# Patient Record
Sex: Male | Born: 1955 | Race: Asian | Hispanic: No | Marital: Married | Smoking: Never smoker
Health system: Southern US, Community
[De-identification: ages and names within clinical notes are randomized; demographics above are authoritative.]

## PROBLEM LIST (undated history)

## (undated) DIAGNOSIS — I10 Essential (primary) hypertension: Secondary | ICD-10-CM

## (undated) DIAGNOSIS — R1013 Epigastric pain: Secondary | ICD-10-CM

## (undated) DIAGNOSIS — K7689 Other specified diseases of liver: Secondary | ICD-10-CM

## (undated) DIAGNOSIS — N529 Male erectile dysfunction, unspecified: Secondary | ICD-10-CM

## (undated) DIAGNOSIS — R9431 Abnormal electrocardiogram [ECG] [EKG]: Secondary | ICD-10-CM

## (undated) DIAGNOSIS — E7800 Pure hypercholesterolemia, unspecified: Secondary | ICD-10-CM

## (undated) DIAGNOSIS — Z86010 Personal history of colonic polyps: Secondary | ICD-10-CM

## (undated) DIAGNOSIS — E119 Type 2 diabetes mellitus without complications: Secondary | ICD-10-CM

## (undated) DIAGNOSIS — F329 Major depressive disorder, single episode, unspecified: Secondary | ICD-10-CM

## (undated) DIAGNOSIS — K7581 Nonalcoholic steatohepatitis (NASH): Secondary | ICD-10-CM

## (undated) DIAGNOSIS — B009 Herpesviral infection, unspecified: Secondary | ICD-10-CM

## (undated) DIAGNOSIS — K219 Gastro-esophageal reflux disease without esophagitis: Secondary | ICD-10-CM

## (undated) HISTORY — DX: Essential (primary) hypertension: I10

## (undated) HISTORY — DX: Pure hypercholesterolemia, unspecified: E78.00

## (undated) HISTORY — DX: Abnormal electrocardiogram (ECG) (EKG): R94.31

## (undated) HISTORY — DX: Other specified diseases of liver: K76.89

## (undated) HISTORY — DX: Gastro-esophageal reflux disease without esophagitis: K21.9

## (undated) HISTORY — DX: Type 2 diabetes mellitus without complications: E11.9

## (undated) HISTORY — PX: OTHER SURGICAL HISTORY: SHX169

## (undated) HISTORY — PX: COLONOSCOPY: SHX174

## (undated) HISTORY — DX: Male erectile dysfunction, unspecified: N52.9

## (undated) HISTORY — DX: Major depressive disorder, single episode, unspecified: F32.9

## (undated) HISTORY — DX: Personal history of colonic polyps: Z86.010

## (undated) HISTORY — DX: Herpesviral infection, unspecified: B00.9

## (undated) HISTORY — DX: Epigastric pain: R10.13

## (undated) HISTORY — DX: Nonalcoholic steatohepatitis (NASH): K75.81

---

## 2003-11-22 HISTORY — PX: OTHER SURGICAL HISTORY: SHX169

## 2004-08-15 ENCOUNTER — Ambulatory Visit: Payer: Self-pay | Admitting: Endocrinology

## 2004-11-21 ENCOUNTER — Ambulatory Visit: Payer: Self-pay | Admitting: Endocrinology

## 2004-11-22 ENCOUNTER — Ambulatory Visit (HOSPITAL_COMMUNITY): Admission: RE | Admit: 2004-11-22 | Discharge: 2004-11-22 | Payer: Self-pay | Admitting: Endocrinology

## 2005-05-15 ENCOUNTER — Ambulatory Visit: Payer: Self-pay | Admitting: Endocrinology

## 2005-05-22 ENCOUNTER — Ambulatory Visit: Payer: Self-pay | Admitting: Endocrinology

## 2005-05-29 ENCOUNTER — Ambulatory Visit: Payer: Self-pay | Admitting: Pulmonary Disease

## 2005-07-30 ENCOUNTER — Ambulatory Visit: Payer: Self-pay | Admitting: Endocrinology

## 2005-08-06 ENCOUNTER — Ambulatory Visit: Payer: Self-pay | Admitting: Endocrinology

## 2005-11-29 ENCOUNTER — Ambulatory Visit: Payer: Self-pay | Admitting: Endocrinology

## 2005-12-05 ENCOUNTER — Ambulatory Visit: Payer: Self-pay | Admitting: Endocrinology

## 2006-03-27 ENCOUNTER — Ambulatory Visit: Payer: Self-pay | Admitting: Endocrinology

## 2006-04-01 ENCOUNTER — Ambulatory Visit: Payer: Self-pay | Admitting: Endocrinology

## 2006-04-28 IMAGING — CT CT ABDOMEN W/ CM
1 of 3 series · 14 of 32 positions shown, 19 images · IV contrast (omnipaque)
Comparison: None.

CLINICAL DATA: 48-year-old male ? elevated LFT(s). 
ABDOMEN CT SCAN WITH CONTRAST:
TECHNIQUE: 125 cc Omnipaque contrast administered intravenously with a multidetector helical imaging performed of the abdomen.

[Series 2: abdomen 5.0 b40f st · axial · 0.63mm/px · z∈[+1076,+1330]mm · 14 of 59 slices shown, 19 images]
[im 4/59  soft-tissue]
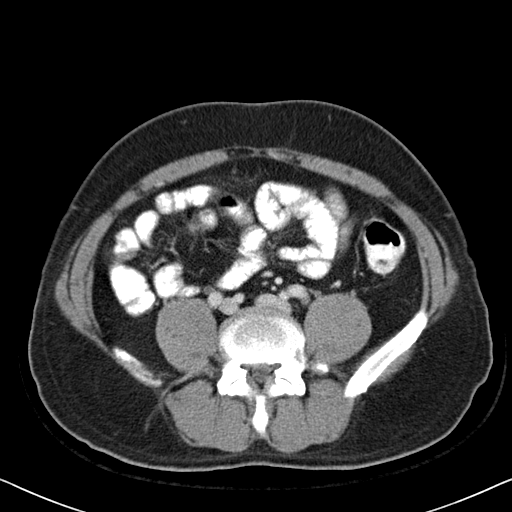
[im 4/59  bone]
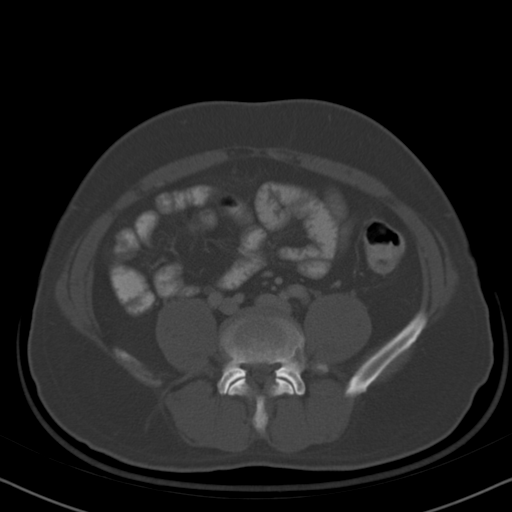
[im 8/59  soft-tissue]
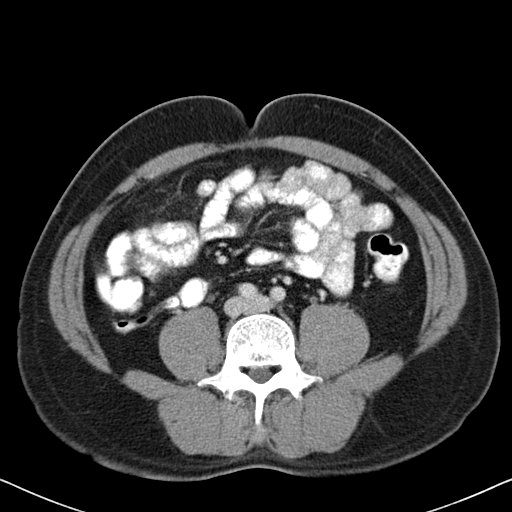
[im 11/59  soft-tissue]
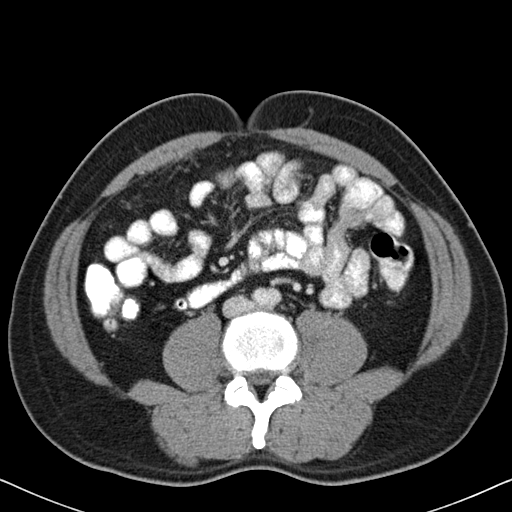
[im 19/59  soft-tissue]
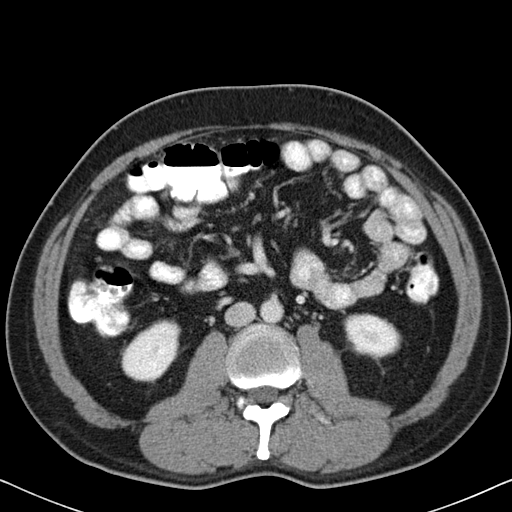
[im 22/59  soft-tissue]
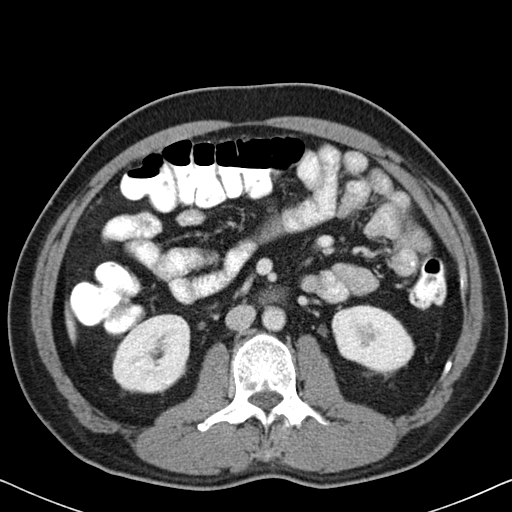
[im 26/59  soft-tissue]
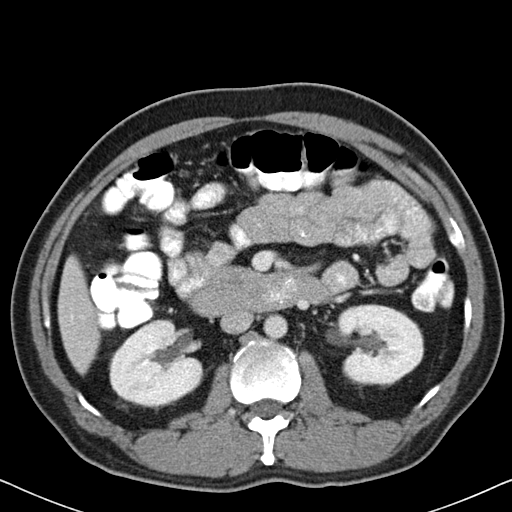
[im 30/59  soft-tissue]
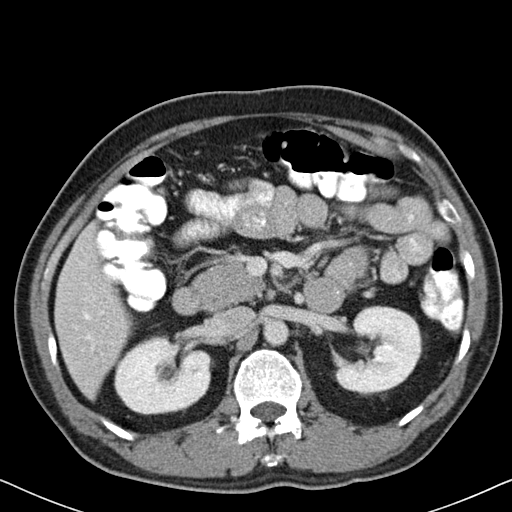
[im 33/59  soft-tissue]
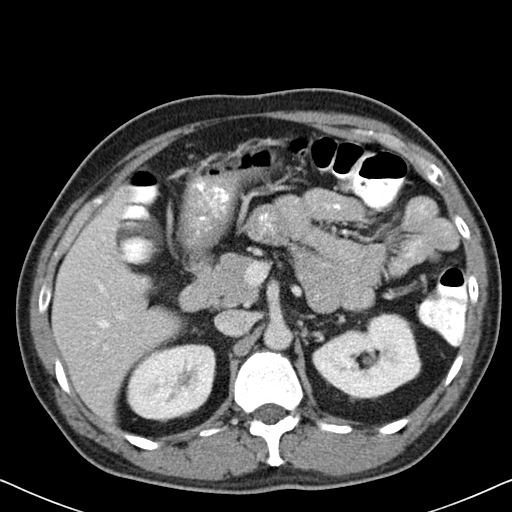
[im 37/59  soft-tissue]
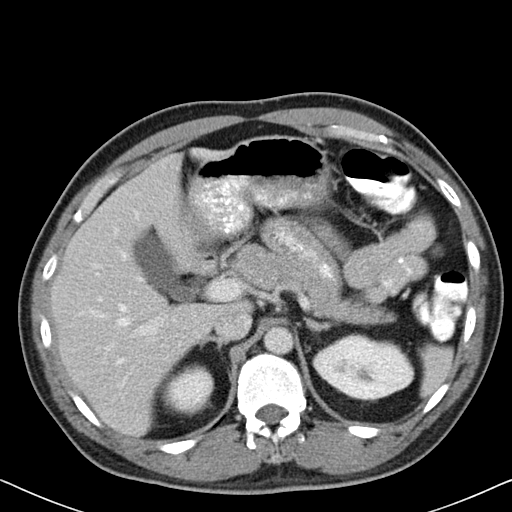
[im 37/59  bone]
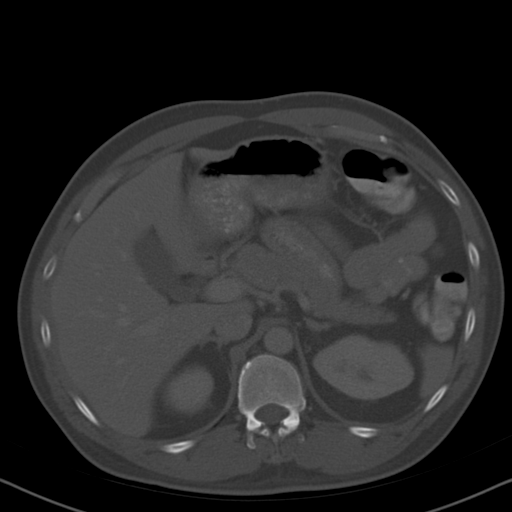
[im 40/59  soft-tissue]
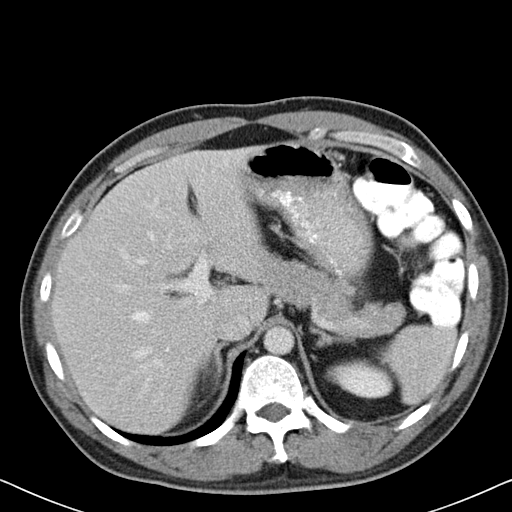
[im 44/59  lung]
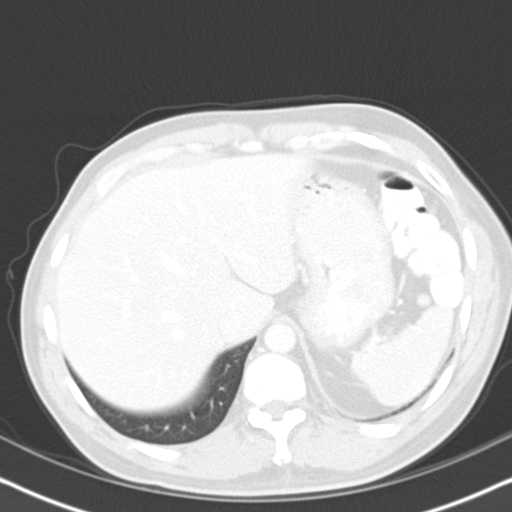
[im 48/59  soft-tissue]
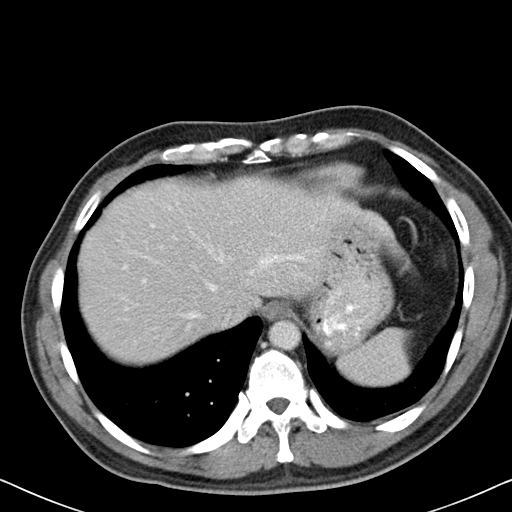
[im 48/59  lung]
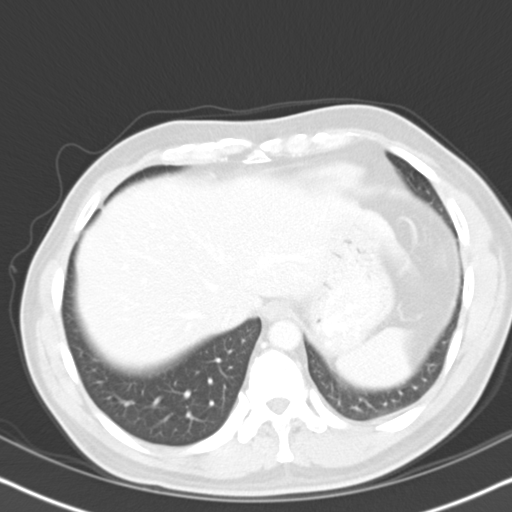
[im 51/59  soft-tissue]
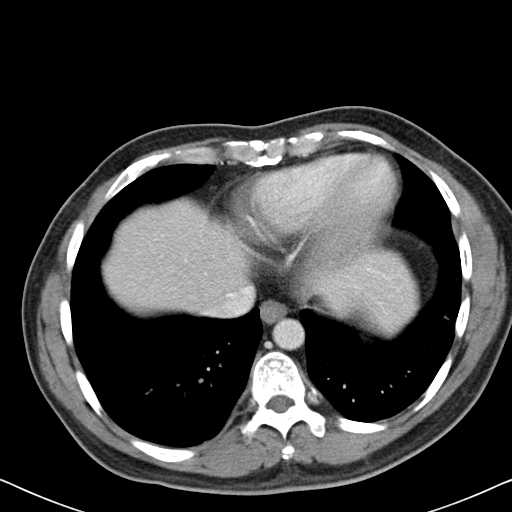
[im 51/59  lung]
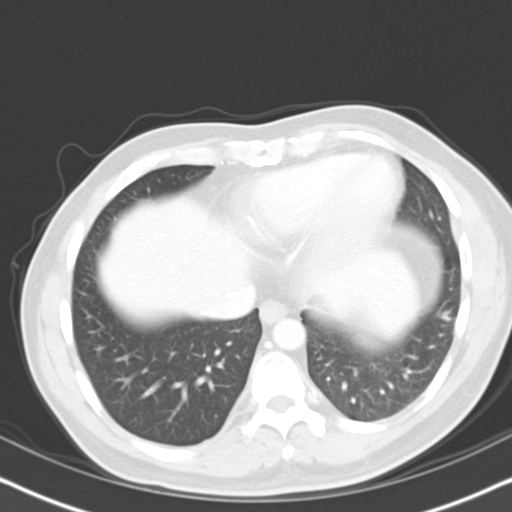
[im 55/59  soft-tissue]
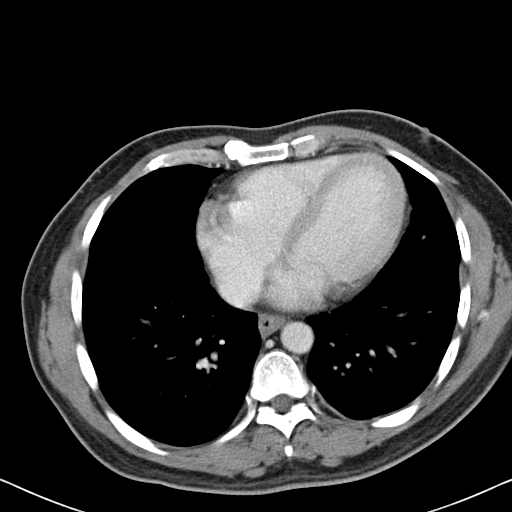
[im 55/59  lung]
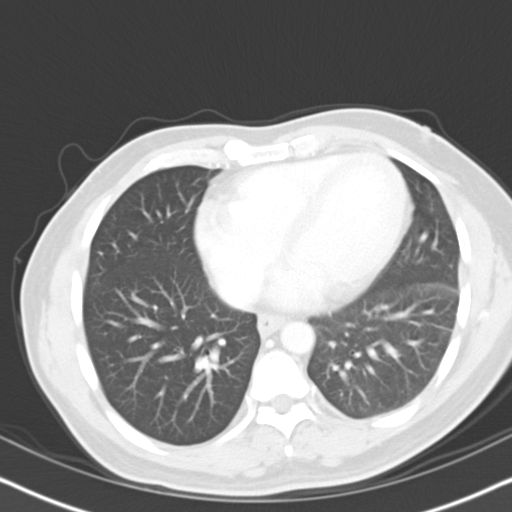

[14 of 32 positions shown; findings below may reference images not displayed]

FINDINGS: Minimal bibasilar subpleural scarring, images 7 and 8.  No pericardial or pleural fluid.  Liver demonstrates homogeneous enhancement without a focal abnormality, biliary dilatation, or fatty infiltration.  Hepatic and portal veins are patent.  The gallbladder, biliary system, adrenal glands, spleen, accessory splenule, kidneys and pancreas are all normal.  The proximal small bowel is under distended.  No bowel obstruction, dilatation, ascites, adenopathy or free air.  Appendix in the right lower quadrant contains contrast and air and is normal.
IMPRESSION: No acute finding in the abdomen.  Normal CT appearance of the liver.

## 2006-07-04 ENCOUNTER — Ambulatory Visit: Payer: Self-pay | Admitting: Endocrinology

## 2006-07-04 LAB — CONVERTED CEMR LAB
Microalb Creat Ratio: 13.7 mg/g (ref 0.0–30.0)
Microalb, Ur: 2.9 mg/dL — ABNORMAL HIGH (ref 0.0–1.9)

## 2006-07-08 ENCOUNTER — Ambulatory Visit: Payer: Self-pay | Admitting: Endocrinology

## 2006-07-14 ENCOUNTER — Ambulatory Visit: Payer: Self-pay | Admitting: Internal Medicine

## 2006-10-27 ENCOUNTER — Ambulatory Visit: Payer: Self-pay | Admitting: Endocrinology

## 2006-11-04 ENCOUNTER — Ambulatory Visit: Payer: Self-pay | Admitting: Endocrinology

## 2007-02-03 ENCOUNTER — Ambulatory Visit: Payer: Self-pay | Admitting: Endocrinology

## 2007-02-03 LAB — CONVERTED CEMR LAB
Hgb A1c MFr Bld: 6.2 % — ABNORMAL HIGH (ref 4.6–6.0)
Testosterone: 568.6 ng/dL (ref 350.00–890)

## 2007-02-09 ENCOUNTER — Ambulatory Visit: Payer: Self-pay | Admitting: Endocrinology

## 2007-03-31 ENCOUNTER — Encounter: Payer: Self-pay | Admitting: Endocrinology

## 2007-03-31 DIAGNOSIS — F329 Major depressive disorder, single episode, unspecified: Secondary | ICD-10-CM

## 2007-03-31 DIAGNOSIS — F3289 Other specified depressive episodes: Secondary | ICD-10-CM

## 2007-03-31 DIAGNOSIS — E119 Type 2 diabetes mellitus without complications: Secondary | ICD-10-CM | POA: Insufficient documentation

## 2007-03-31 DIAGNOSIS — I10 Essential (primary) hypertension: Secondary | ICD-10-CM | POA: Insufficient documentation

## 2007-03-31 HISTORY — DX: Major depressive disorder, single episode, unspecified: F32.9

## 2007-03-31 HISTORY — DX: Type 2 diabetes mellitus without complications: E11.9

## 2007-03-31 HISTORY — DX: Other specified depressive episodes: F32.89

## 2007-03-31 HISTORY — DX: Essential (primary) hypertension: I10

## 2007-06-19 ENCOUNTER — Telehealth (INDEPENDENT_AMBULATORY_CARE_PROVIDER_SITE_OTHER): Payer: Self-pay | Admitting: *Deleted

## 2007-07-06 ENCOUNTER — Ambulatory Visit: Payer: Self-pay | Admitting: Endocrinology

## 2007-07-06 LAB — CONVERTED CEMR LAB
AST: 23 units/L (ref 0–37)
Albumin: 4.2 g/dL (ref 3.5–5.2)
Alkaline Phosphatase: 50 units/L (ref 39–117)
Basophils Relative: 0.9 % (ref 0.0–1.0)
Bilirubin, Direct: 0.1 mg/dL (ref 0.0–0.3)
CO2: 30 meq/L (ref 19–32)
Calcium: 9.4 mg/dL (ref 8.4–10.5)
Cholesterol: 97 mg/dL (ref 0–200)
Creatinine, Ser: 1 mg/dL (ref 0.4–1.5)
Creatinine,U: 94.7 mg/dL
Eosinophils Relative: 1.2 % (ref 0.0–5.0)
GFR calc Af Amer: 101 mL/min
HDL: 35.1 mg/dL — ABNORMAL LOW (ref >39.0)
Hemoglobin: 15.3 g/dL (ref 13.0–17.0)
LDL Cholesterol: 42 mg/dL (ref 0–99)
Leukocytes, UA: NEGATIVE
MCHC: 33.6 g/dL (ref 30.0–36.0)
MCV: 93.1 fL (ref 78.0–100.0)
Microalb, Ur: 1.3 mg/dL (ref 0.0–1.9)
Monocytes Absolute: 0.4 10*3/uL (ref 0.2–0.7)
Nitrite: NEGATIVE
Platelets: 241 10*3/uL (ref 150–400)
Potassium: 4.4 meq/L (ref 3.5–5.1)
RBC: 4.88 M/uL (ref 4.22–5.81)
RDW: 11.9 % (ref 11.5–14.6)
Total Bilirubin: 0.8 mg/dL (ref 0.3–1.2)
Total CHOL/HDL Ratio: 2.8
Total Protein: 7.4 g/dL (ref 6.0–8.3)
Triglycerides: 98 mg/dL (ref 0–149)
VLDL: 20 mg/dL (ref 0–40)

## 2007-07-13 ENCOUNTER — Ambulatory Visit: Payer: Self-pay | Admitting: Endocrinology

## 2007-10-09 ENCOUNTER — Encounter: Payer: Self-pay | Admitting: Endocrinology

## 2008-01-01 ENCOUNTER — Encounter: Payer: Self-pay | Admitting: Endocrinology

## 2008-01-05 ENCOUNTER — Ambulatory Visit: Payer: Self-pay | Admitting: Endocrinology

## 2008-01-05 ENCOUNTER — Telehealth: Payer: Self-pay | Admitting: Endocrinology

## 2008-01-11 ENCOUNTER — Ambulatory Visit: Payer: Self-pay | Admitting: Endocrinology

## 2008-01-11 DIAGNOSIS — M25529 Pain in unspecified elbow: Secondary | ICD-10-CM

## 2008-01-11 DIAGNOSIS — R252 Cramp and spasm: Secondary | ICD-10-CM | POA: Insufficient documentation

## 2008-01-26 ENCOUNTER — Telehealth: Payer: Self-pay | Admitting: Endocrinology

## 2008-07-12 ENCOUNTER — Ambulatory Visit: Payer: Self-pay | Admitting: Endocrinology

## 2008-07-13 LAB — CONVERTED CEMR LAB
AST: 29 units/L (ref 0–37)
Albumin: 4.2 g/dL (ref 3.5–5.2)
Alkaline Phosphatase: 43 units/L (ref 39–117)
Basophils Relative: 0.5 % (ref 0.0–3.0)
Calcium: 9.2 mg/dL (ref 8.4–10.5)
Eosinophils Absolute: 0.1 10*3/uL (ref 0.0–0.7)
Eosinophils Relative: 1.3 % (ref 0.0–5.0)
GFR calc Af Amer: 114 mL/min
HCT: 43.1 % (ref 39.0–52.0)
HDL: 35.7 mg/dL — ABNORMAL LOW (ref >39.0)
Hemoglobin: 14.5 g/dL (ref 13.0–17.0)
Lymphocytes Relative: 37.8 % (ref 12.0–46.0)
MCV: 95 fL (ref 78.0–100.0)
Microalb Creat Ratio: 10.4 mg/g (ref 0.0–30.0)
Microalb, Ur: 1.1 mg/dL (ref 0.0–1.9)
Monocytes Absolute: 0.3 10*3/uL (ref 0.1–1.0)
Monocytes Relative: 5.3 % (ref 3.0–12.0)
Neutrophils Relative %: 55.1 % (ref 43.0–77.0)
Platelets: 191 10*3/uL (ref 150–400)
Potassium: 4.1 meq/L (ref 3.5–5.1)
RBC: 4.54 M/uL (ref 4.22–5.81)
RDW: 12.1 % (ref 11.5–14.6)
Total CHOL/HDL Ratio: 3
Urobilinogen, UA: 0.2 (ref 0.0–1.0)
VLDL: 16 mg/dL (ref 0–40)
WBC: 6.5 10*3/uL (ref 4.5–10.5)
pH: 5.5 (ref 5.0–8.0)

## 2008-07-18 ENCOUNTER — Ambulatory Visit: Payer: Self-pay | Admitting: Endocrinology

## 2008-07-18 DIAGNOSIS — K7689 Other specified diseases of liver: Secondary | ICD-10-CM

## 2008-07-18 DIAGNOSIS — K7581 Nonalcoholic steatohepatitis (NASH): Secondary | ICD-10-CM

## 2008-07-18 HISTORY — DX: Other specified diseases of liver: K76.89

## 2009-01-10 ENCOUNTER — Telehealth (INDEPENDENT_AMBULATORY_CARE_PROVIDER_SITE_OTHER): Payer: Self-pay | Admitting: *Deleted

## 2009-01-11 ENCOUNTER — Ambulatory Visit: Payer: Self-pay | Admitting: Endocrinology

## 2009-01-11 LAB — CONVERTED CEMR LAB: Hgb A1c MFr Bld: 7.2 % — ABNORMAL HIGH (ref 4.6–6.5)

## 2009-01-16 ENCOUNTER — Ambulatory Visit: Payer: Self-pay | Admitting: Endocrinology

## 2009-01-16 DIAGNOSIS — N529 Male erectile dysfunction, unspecified: Secondary | ICD-10-CM | POA: Insufficient documentation

## 2009-01-16 HISTORY — DX: Male erectile dysfunction, unspecified: N52.9

## 2009-05-29 ENCOUNTER — Telehealth: Payer: Self-pay | Admitting: Endocrinology

## 2009-07-21 ENCOUNTER — Ambulatory Visit: Payer: Self-pay | Admitting: Endocrinology

## 2009-07-22 LAB — CONVERTED CEMR LAB
BUN: 13 mg/dL (ref 6–23)
Bilirubin Urine: NEGATIVE
Calcium: 9.6 mg/dL (ref 8.4–10.5)
Chloride: 106 meq/L (ref 96–112)
Cholesterol: 131 mg/dL (ref 0–200)
Eosinophils Absolute: 0.1 10*3/uL (ref 0.0–0.7)
HCT: 43.3 % (ref 39.0–52.0)
Hemoglobin: 14.4 g/dL (ref 13.0–17.0)
Hgb A1c MFr Bld: 8.1 % — ABNORMAL HIGH (ref 4.6–6.5)
Ketones, ur: NEGATIVE mg/dL
LDL Cholesterol: 69 mg/dL (ref 0–99)
Leukocytes, UA: NEGATIVE
Lymphs Abs: 1.9 10*3/uL (ref 0.7–4.0)
MCV: 93.6 fL (ref 78.0–100.0)
Monocytes Absolute: 0.5 10*3/uL (ref 0.1–1.0)
PSA: 1.31 ng/mL (ref 0.10–4.00)
Platelets: 221 10*3/uL (ref 150.0–400.0)
Potassium: 4.1 meq/L (ref 3.5–5.1)
Specific Gravity, Urine: 1.02 (ref 1.000–1.030)
TSH: 2.85 microintl units/mL (ref 0.35–5.50)
Total Bilirubin: 1 mg/dL (ref 0.3–1.2)
Urine Glucose: 250 mg/dL
Urobilinogen, UA: 0.2 (ref 0.0–1.0)
WBC: 7.3 10*3/uL (ref 4.5–10.5)

## 2009-07-26 ENCOUNTER — Ambulatory Visit: Payer: Self-pay | Admitting: Endocrinology

## 2009-07-26 ENCOUNTER — Telehealth: Payer: Self-pay | Admitting: Endocrinology

## 2009-07-26 DIAGNOSIS — R9431 Abnormal electrocardiogram [ECG] [EKG]: Secondary | ICD-10-CM | POA: Insufficient documentation

## 2009-07-26 DIAGNOSIS — K219 Gastro-esophageal reflux disease without esophagitis: Secondary | ICD-10-CM | POA: Insufficient documentation

## 2009-07-26 HISTORY — DX: Gastro-esophageal reflux disease without esophagitis: K21.9

## 2009-07-26 HISTORY — DX: Abnormal electrocardiogram (ECG) (EKG): R94.31

## 2009-08-04 ENCOUNTER — Ambulatory Visit (HOSPITAL_COMMUNITY): Admission: RE | Admit: 2009-08-04 | Discharge: 2009-08-04 | Payer: Self-pay | Admitting: Endocrinology

## 2009-08-04 ENCOUNTER — Ambulatory Visit: Payer: Self-pay

## 2009-08-04 ENCOUNTER — Ambulatory Visit: Payer: Self-pay | Admitting: Cardiology

## 2009-08-04 ENCOUNTER — Encounter: Payer: Self-pay | Admitting: Endocrinology

## 2009-11-20 ENCOUNTER — Ambulatory Visit: Payer: Self-pay | Admitting: Endocrinology

## 2009-11-20 ENCOUNTER — Telehealth: Payer: Self-pay | Admitting: Endocrinology

## 2009-11-20 LAB — CONVERTED CEMR LAB: Hgb A1c MFr Bld: 7.4 % — ABNORMAL HIGH (ref 4.6–6.5)

## 2009-11-23 ENCOUNTER — Ambulatory Visit: Payer: Self-pay | Admitting: Endocrinology

## 2010-01-04 ENCOUNTER — Telehealth: Payer: Self-pay | Admitting: Endocrinology

## 2010-01-05 ENCOUNTER — Ambulatory Visit: Payer: Self-pay | Admitting: Endocrinology

## 2010-01-05 DIAGNOSIS — M79609 Pain in unspecified limb: Secondary | ICD-10-CM

## 2010-02-08 ENCOUNTER — Telehealth: Payer: Self-pay | Admitting: Endocrinology

## 2010-04-06 ENCOUNTER — Telehealth: Payer: Self-pay | Admitting: Endocrinology

## 2010-04-10 ENCOUNTER — Ambulatory Visit: Payer: Self-pay | Admitting: Endocrinology

## 2010-04-10 LAB — CONVERTED CEMR LAB: Hgb A1c MFr Bld: 7.6 % — ABNORMAL HIGH (ref 4.6–6.5)

## 2010-04-13 ENCOUNTER — Ambulatory Visit: Payer: Self-pay | Admitting: Endocrinology

## 2010-04-23 ENCOUNTER — Telehealth: Payer: Self-pay | Admitting: Endocrinology

## 2010-06-14 ENCOUNTER — Telehealth: Payer: Self-pay | Admitting: Endocrinology

## 2010-07-30 ENCOUNTER — Ambulatory Visit: Payer: Self-pay | Admitting: Endocrinology

## 2010-07-30 LAB — CONVERTED CEMR LAB
ALT: 37 units/L (ref 0–53)
AST: 28 units/L (ref 0–37)
BUN: 13 mg/dL (ref 6–23)
Basophils Relative: 0.6 % (ref 0.0–3.0)
Direct LDL: 111.2 mg/dL
Hemoglobin: 16.1 g/dL (ref 13.0–17.0)
Ketones, ur: NEGATIVE mg/dL
Leukocytes, UA: NEGATIVE
MCHC: 34.4 g/dL (ref 30.0–36.0)
MCV: 92.4 fL (ref 78.0–100.0)
Neutrophils Relative %: 51.2 % (ref 43.0–77.0)
Nitrite: NEGATIVE
RBC: 5.06 M/uL (ref 4.22–5.81)
Sodium: 139 meq/L (ref 135–145)
Specific Gravity, Urine: 1.01 (ref 1.000–1.030)
TSH: 1.54 microintl units/mL (ref 0.35–5.50)
Total Bilirubin: 0.6 mg/dL (ref 0.3–1.2)
Total Protein: 7.4 g/dL (ref 6.0–8.3)
Urine Glucose: 500 mg/dL
WBC: 7.7 10*3/uL (ref 4.5–10.5)
pH: 6 (ref 5.0–8.0)

## 2010-08-01 ENCOUNTER — Ambulatory Visit: Payer: Self-pay | Admitting: Endocrinology

## 2010-08-01 ENCOUNTER — Encounter: Payer: Self-pay | Admitting: Endocrinology

## 2010-08-03 ENCOUNTER — Telehealth: Payer: Self-pay | Admitting: Endocrinology

## 2010-09-17 ENCOUNTER — Telehealth: Payer: Self-pay | Admitting: Endocrinology

## 2010-10-04 NOTE — Progress Notes (Signed)
Summary: rx refill req  Phone Note Refill Request Message from:  Fax from Pharmacy on August 03, 2010 2:55 PM  Refills Requested: Medication #1:  NEXIUM 40 MG PACK 1 qd   Last Refilled: 07/26/2009  Method Requested: Electronic Next Appointment Scheduled: 10/31/2010 Initial call taken by: Brenton Grills CMA Duncan Dull),  August 03, 2010 2:56 PM    Prescriptions: NEXIUM 40 MG PACK (ESOMEPRAZOLE MAGNESIUM) 1 qd  #90 x 3   Entered by:   Brenton Grills CMA (AAMA)   Authorized by:   Minus Breeding MD   Signed by:   Brenton Grills CMA (AAMA) on 08/03/2010   Method used:   Electronically to        [facility]* (retail)       [address]       [address] Shipping/mailing       [place], [place]  [address]       Ph: [phone]       Fax: [phone]   RxID:   [id number]

## 2010-10-04 NOTE — Progress Notes (Signed)
Summary: Bromocriptine  Phone Note Call from Patient Call back at Work Phone [phone]   Caller: Mat Carne Summary of Call: Pt's spouse called stating that pt would like to stop Bromocriptine and continue with Glimeparide. Per spouse pt does not like side effects (spouse unable to name specific side effect but is adamant that pt wants to stop medication). Please advise. Initial call taken by: Margaret Pyle, CMA,  April 23, 2010 2:55 PM  Follow-up for Phone Call        ok, but would you be willing to try continuing 1/2 pill at night? Follow-up by: Minus Breeding MD,  April 23, 2010 4:36 PM  Additional Follow-up for Phone Call Additional follow up Details #1::        Per spouse, pt willnot take medication, not even 1/2 tab at bedtime. Pt states he will take Glimeparide 2mg  two times a day. Spouse states that when pt takes 4mg  once daily he has low CBGs and prefers to take two times a day. Please advise.  Additional Follow-up by: Margaret Pyle, CMA,  April 24, 2010 8:08 AM    Additional Follow-up for Phone Call Additional follow up Details #2::    ok i changed med list to reflect this Follow-up by: Minus Breeding MD,  April 24, 2010 8:13 AM  Additional Follow-up for Phone Call Additional follow up Details #3:: Details for Additional Follow-up Action Taken: Spouse informed and will inform pt Additional Follow-up by: Margaret Pyle, CMA,  April 24, 2010 8:35 AM  New/Updated Medications: GLIMEPIRIDE 2 MG TABS (GLIMEPIRIDE) 1 tab two times a day

## 2010-10-04 NOTE — Assessment & Plan Note (Signed)
Summary: 4 MO ROV /NWS #   RS'D PER WIFE/NWS   Vital Signs:  Patient profile:   55 year old male Height:      65 inches (165.10 cm) Weight:      160.38 pounds (72.90 kg) BMI:     26.79 O2 Sat:      96 % on Room air Temp:     97.5 degrees F (36.39 degrees C) oral Pulse rate:   75 / minute BP sitting:   112 / 68  (left arm) Cuff size:   regular  Vitals Entered By: Brenton Grills MA (April 13, 2010 7:55 AM)  O2 Flow:  Room air CC: 4 mo F/U/aj Is Patient Diabetic? Yes   Primary Provider:  ellison  CC:  4 mo F/U/aj.  History of Present Illness: pt states he feels well in general, except he gets shaky when cbg is as low as 80.  no cbg record, but states cbg's are well-controlled   Current Medications (verified): 1)  Vytorin 10-80 Mg  Tabs (Ezetimibe-Simvastatin) .... Take 1 By Mouth Qd 2)  Actoplus Met 15-500 Mg  Tabs (Pioglitazone Hcl-Metformin Hcl) .... Take 2 Qam & 1 Pm By Mouth Qd 3)  Januvia 100 Mg  Tabs (Sitagliptin Phosphate) .... Take 1 By Mouth Qd 4)  Freestyle Test   Strp (Glucose Blood) .... Check Blood Sugars As Directed 5)  Zestril 5 Mg  Tabs (Lisinopril) .... Qd 6)  Adult Aspirin Low Strength 81 Mg Tbdp (Aspirin) .... Take 1 By Mouth Qd 7)  Viagra 100 Mg Tabs (Sildenafil Citrate) .... As Needed Use 8)  Glimepiride 4 Mg Tabs (Glimepiride) .Marland Kitchen.. 1 Qd 9)  Nexium 40 Mg Pack (Esomeprazole Magnesium) .Marland Kitchen.. 1 Qd 10)  Triamcinolone Acetonide 0.025 % Crea (Triamcinolone Acetonide) .... Three Times A Day As Needed Rash 11)  Methocarbamol 500 Mg Tabs (Methocarbamol) .Marland Kitchen.. 1 At Bedtime For Leg Cramps. 12)  Ondansetron 4 Mg Tbdp (Ondansetron) .Marland Kitchen.. 1 Every 4 Hrs As Needed For Nausea 13)  Ciprofloxacin Hcl 500 Mg Tabs (Ciprofloxacin Hcl) .Marland Kitchen.. 1 Two Times A Day.  Take If Diarrhea 14)  Hydrocodone-Acetaminophen 10-325 Mg Tabs (Hydrocodone-Acetaminophen) .... 1/2-1 Every 4 Hrs As Needed For Pain  Allergies (verified): No Known Drug Allergies  Past History:  Past Medical  History: Last updated: 03/31/2007 Depression Diabetes mellitus, type II Hypertension Dyspepsia HSV-1 NASH  Review of Systems  The patient denies syncope.    Physical Exam  General:  normal appearance.   Pulses:  dorsalis pedis intact bilat. Extremities:  no deformity.  no ulcer on the feet.  feet are of normal color and temp.  no edema  Neurologic:  sensation is intact to touch on the feet    Impression & Recommendations:  Problem # 1:  DIABETES MELLITUS, TYPE II (ICD-250.00) it is best to minimize the sulfonylurea if possible HgbA1C: 7.6 (04/10/2010)  Medications Added to Medication List This Visit: 1)  Glimepiride 2 Mg Tabs (Glimepiride) .Marland Kitchen.. 1 tab each am 2)  Bromocriptine Mesylate 2.5 Mg Tabs (Bromocriptine mesylate) .Marland Kitchen.. 1 tab at bedtime  Other Orders: Est. Patient Level III (04540)  Patient Instructions: 1)  reduce glimepiride to 2 mg each am. 2)  add bromocriptine 2.5 mg at bedtime.  side-effects (sometimes) are nausea and dizziness.  if these happen, they go away with time.  starting with 1/2 tab at bedtime, for the 1st week, can avoid these problems.   3)  Please schedule a physical appointment in 3 months, with a1c and microalbumin  prior 250.00.   Prescriptions: BROMOCRIPTINE MESYLATE 2.5 MG TABS (BROMOCRIPTINE MESYLATE) 1 tab at bedtime  #90 x 3   Entered and Authorized by:   Minus Breeding MD   Signed by:   Minus Breeding MD on 04/13/2010   Method used:   Electronically to        [facility]* (retail)       [address]       [address] Shipping/mailing       [place], [place]  [address]       Ph: [phone]       Fax: [phone]   RxID:   [id number] GLIMEPIRIDE 2 MG TABS (GLIMEPIRIDE) 1 tab each am  #90 x 3   Entered and Authorized by:   Minus Breeding MD   Signed by:   Minus Breeding MD on 04/13/2010   Method used:   Electronically to        [facility]* (retail)       [address]        [address] Shipping/mailing       [place], [place]  [address]       Ph: [phone]       Fax: [phone]   RxID:   [id number]

## 2010-10-04 NOTE — Progress Notes (Signed)
Summary: Tylenol #3 refill req  Phone Note Call from Patient Call back at Work Phone [phone]   Caller: Patient [phone] w Summary of Call: Pt's spouse called stating that his leg pain has increased. Pt is in the [place] and his niece will be leaving Saturday. Pt is requesting pain med refill to be picked up and taken to the [place] by pt's niece.  Pt's spouse is requesting a call when ready to [phone] Initial call taken by: Margaret Pyle, CMA,  February 08, 2010 3:24 PM  Follow-up for Phone Call        i printed Follow-up by: Minus Breeding MD,  February 08, 2010 4:15 PM  Additional Follow-up for Phone Call Additional follow up Details #1::        Pt's spouse informed, Rx in cabinet for pt pick up Additional Follow-up by: Margaret Pyle, CMA,  February 08, 2010 4:30 PM    New/Updated Medications: HYDROCODONE-ACETAMINOPHEN 10-325 MG TABS (HYDROCODONE-ACETAMINOPHEN) 1/2-1 every 4 hrs as needed for pain Prescriptions: HYDROCODONE-ACETAMINOPHEN 10-325 MG TABS (HYDROCODONE-ACETAMINOPHEN) 1/2-1 every 4 hrs as needed for pain  #100 x 0   Entered and Authorized by:   Minus Breeding MD   Signed by:   Minus Breeding MD on 02/08/2010   Method used:   Print then Give to Patient   RxID:   [id number]

## 2010-10-04 NOTE — Progress Notes (Signed)
Summary: rx refill req  Phone Note Refill Request Message from:  Fax from Pharmacy on June 14, 2010 12:04 PM  Refills Requested: Medication #1:  VYTORIN 10-80 MG  TABS take 1 by mouth qd   Dosage confirmed as above?Dosage Confirmed   Last Refilled: 12/04/2009  Medication #2:  ACTOPLUS MET 15-500 MG  TABS take 2 qam & 1 pm by mouth qd   Dosage confirmed as above?Dosage Confirmed   Last Refilled: 12/04/2009  Medication #3:  JANUVIA 100 MG  TABS take 1 by mouth qd   Dosage confirmed as above?Dosage Confirmed   Last Refilled: 01/31/2010 90 day supply-[facility]   Method Requested: Electronic Next Appointment Scheduled: 08/01/2010 Initial call taken by: Brenton Grills MA,  June 14, 2010 12:05 PM    Prescriptions: JANUVIA 100 MG  TABS (SITAGLIPTIN PHOSPHATE) take 1 by mouth qd  #90 x 3   Entered by:   Brenton Grills MA   Authorized by:   Minus Breeding MD   Signed by:   Brenton Grills MA on 06/14/2010   Method used:   Electronically to        [facility]* (retail)       [address]       [address] Shipping/mailing       [place], [place]  [address]       Ph: [phone]       Fax: [phone]   RxID:   [id number] ACTOPLUS MET 15-500 MG  TABS (PIOGLITAZONE HCL-METFORMIN HCL) take 2 qam & 1 pm by mouth qd  #270 Tablet x 3   Entered by:   Brenton Grills MA   Authorized by:   Minus Breeding MD   Signed by:   Brenton Grills MA on 06/14/2010   Method used:   Electronically to        [facility]* (retail)       [address]       [address] Shipping/mailing       [place], [place]  [address]       Ph: [phone]       Fax: [phone]   RxID:   [id number] VYTORIN 10-80 MG  TABS (EZETIMIBE-SIMVASTATIN) take 1 by mouth qd  #90 x 3   Entered by:   Brenton Grills MA   Authorized by:   Minus Breeding MD   Signed by:   Brenton Grills MA on 06/14/2010   Method used:   Electronically to        [facility]* (retail)       [address]       [address]. Shipping/mailing       [place], [place]  [address]       Ph: [phone]       Fax: [phone]   RxID:   [id number]

## 2010-10-04 NOTE — Progress Notes (Signed)
Summary: labs  Phone Note Other Incoming   Summary of Call: The patient is at the lab and I do not know what labs are needed. Please advise. Initial call taken by: Lucious Groves,  November 20, 2009 10:12 AM  Follow-up for Phone Call        i ordered a1c Follow-up by: Minus Breeding MD,  November 20, 2009 10:21 AM  Additional Follow-up for Phone Call Additional follow up Details #1::        COMPLETED. Additional Follow-up by: Lucious Groves,  November 20, 2009 10:22 AM

## 2010-10-04 NOTE — Assessment & Plan Note (Signed)
Summary: 3 mth physical--stc   Vital Signs:  Patient profile:   55 year old male Height:      65 inches (165.10 cm) Weight:      168.25 pounds (76.48 kg) BMI:     28.10 O2 Sat:      95 % on Room air Temp:     98.6 degrees F (37.00 degrees C) oral Pulse rate:   83 / minute Pulse rhythm:   regular BP sitting:   108 / 68  (left arm) Cuff size:   regular  Vitals Entered By: Brenton Grills CMA Duncan Dull) (August 01, 2010 8:37 AM)  O2 Flow:  Room air CC: Physical/aj Is Patient Diabetic? Yes   Primary Provider:  Azzure Garabedian  CC:  Physical/aj.  History of Present Illness: here for regular wellness examination.  He's feeling pretty well in general, and does not drink or smoke.  Current Medications (verified): 1)  Vytorin 10-80 Mg  Tabs (Ezetimibe-Simvastatin) .... Take 1 By Mouth Qd 2)  Actoplus Met 15-500 Mg  Tabs (Pioglitazone Hcl-Metformin Hcl) .... Take 2 Qam & 1 Pm By Mouth Qd 3)  Januvia 100 Mg  Tabs (Sitagliptin Phosphate) .... Take 1 By Mouth Qd 4)  Freestyle Test   Strp (Glucose Blood) .... Check Blood Sugars As Directed 5)  Zestril 5 Mg  Tabs (Lisinopril) .... Qd 6)  Adult Aspirin Low Strength 81 Mg Tbdp (Aspirin) .... Take 1 By Mouth Qd 7)  Viagra 100 Mg Tabs (Sildenafil Citrate) .... As Needed Use 8)  Nexium 40 Mg Pack (Esomeprazole Magnesium) .Marland Kitchen.. 1 Qd 9)  Triamcinolone Acetonide 0.025 % Crea (Triamcinolone Acetonide) .... Three Times A Day As Needed Rash 10)  Methocarbamol 500 Mg Tabs (Methocarbamol) .Marland Kitchen.. 1 At Bedtime For Leg Cramps. 11)  Hydrocodone-Acetaminophen 10-325 Mg Tabs (Hydrocodone-Acetaminophen) .... 1/2-1 Every 4 Hrs As Needed For Pain 12)  Glimepiride 2 Mg Tabs (Glimepiride) .Marland Kitchen.. 1 Tab Two Times A Day  Allergies (verified): No Known Drug Allergies  Family History: Reviewed history from 07/13/2007 and no changes required. no cancer dm:  mother and brother  Social History: Reviewed history from 07/26/2009 and no changes  required. married unemployed  Review of Systems       The patient complains of weight gain.  The patient denies fever, vision loss, decreased hearing, syncope, prolonged cough, headaches, abdominal pain, melena, hematochezia, severe indigestion/heartburn, hematuria, suspicious skin lesions, and depression.    Physical Exam  General:  normal appearance.   Head:  head: no deformity eyes: no periorbital swelling, no proptosis external nose and ears are normal mouth: no lesion seen Neck:  Supple without thyroid enlargement or tenderness.  Heart:  Regular rate and rhythm without murmurs or gallops noted. Normal S1,S2.   Abdomen:  abdomen is soft, nontender.  no hepatosplenomegaly.   not distended.  no hernia  Rectal:  normal external and internal exam.  heme neg  Prostate:  Normal size prostate without masses or tenderness.  Msk:  muscle bulk and strength are grossly normal.  no obvious joint swelling.  gait is normal and steady  Neurologic:  cn 2-12 grossly intact.   readily moves all 4's.   sensation is intact to touch on the feet  Skin:  normal texture and temp.  no rash.  not diaphoretic  Cervical Nodes:  No significant adenopathy.  Psych:  Alert and cooperative; normal mood and affect; normal attention span and concentration.   Additional Exam:  SEPARATE EVALUATION FOLLOWS--EACH PROBLEM HERE IS NEW, NOT RESPONDING TO TREATMENT,  OR POSES SIGNIFICANT RISK TO THE PATIENT'S HEALTH: HISTORY OF THE PRESENT ILLNESS: dyslipidemia: pt says he often misses the vytorin dm: pt refuses insulin, parlodel, and byetta PAST MEDICAL HISTORY reviewed and up to date today REVIEW OF SYSTEMS: denies chest pain and sob PHYSICAL EXAMINATION: no deformity.  no ulcer on the feet.  feet are of normal color and temp.  no edema dorsalis pedis intact bilat.  no carotid bruit clear to auscultation.  no respiratory distress LAB/XRAY RESULTS: Hemoglobin A1C       [H]  8.7 %                        4.6-6.5 Cholesterol LDL 111.2 mg/dL IMPRESSION: dm.  he needs insulin to control a1c dyslipidemia, therapy limited by noncompliance.  i'll do the best i can. PLAN: see instruction sheet   Impression & Recommendations:  Problem # 1:  ROUTINE GENERAL MEDICAL EXAM@HEALTH  CARE FACL (ICD-V70.0)  Medications Added to Medication List This Visit: 1)  Simvastatin 80 Mg Tabs (Simvastatin) .Marland Kitchen.. 1 tab at bedtime 2)  Welchol 625 Mg Tabs (Colesevelam hcl) .... 6 tabs once daily 3)  Glimepiride 4 Mg Tabs (Glimepiride) .Marland Kitchen.. 1 tab each am  Other Orders: Admin 1st Vaccine (52841) Flu Vaccine 38yrs + (32440) EKG w/ Interpretation (93000) Est. Patient Level III (10272) Est. Patient 40-64 years (53664)  Patient Instructions: 1)  please make every effort to remember medictions. 2)  change glimepiride to 4 mg each am. 3)  change vytorin to: welchol 6x625 mg once daily, and simvastatin 80 mg at bedtime. 4)  Please schedule a follow-up appointment in 3 months. 5)  please consider these measures for your health:  minimize alcohol.  do not use tobacco products.  have a colonoscopy at least every 10 years from age 48.  keep firearms safely stored.  always use seat belts.  have working smoke alarms in your home.  see an eye doctor and dentist regularly.  never drive under the influence of alcohol or drugs (including prescription drugs).  those with fair skin should take precautions against the sun. 6)  please let me know what your wishes would be, if artificial life support measures should become necessary.  it is critically important to prevent falling down (keep floor areas well-lit, dry, and free of loose objects) Prescriptions: SIMVASTATIN 80 MG TABS (SIMVASTATIN) 1 tab at bedtime  #30 x 11   Entered and Authorized by:   Minus Breeding MD   Signed by:   Minus Breeding MD on 08/01/2010   Method used:   Electronically to        [facility]* (retail)       [address]       [address] Shipping/mailing       [place], [place]  [address]       Ph: [phone]       Fax: [phone]   RxID:   [id number] WELCHOL 625 MG TABS (COLESEVELAM HCL) 6 tabs once daily  #180 x 11   Entered and Authorized by:   Minus Breeding MD   Signed by:   Minus Breeding MD on 08/01/2010   Method used:   Electronically to        [facility]* (retail)       [address]       [address] Shipping/mailing       [place], [place]  [address]       Ph: [phone]       Fax: [phone]   RxID:   [id number] GLIMEPIRIDE 4 MG TABS (GLIMEPIRIDE) 1 tab each am  #30 x 11   Entered and Authorized by:   Minus Breeding MD   Signed by:   Minus Breeding MD on 08/01/2010   Method used:   Electronically to        [facility]* (retail)       [address]       [address] Shipping/mailing       [place], [place]  [address]       Ph: [phone]       Fax: [phone]   RxID:   [id number] GLIMEPIRIDE 4 MG TABS (GLIMEPIRIDE) 1 tab each am  #30 x 11   Entered and Authorized by:   Minus Breeding MD   Signed by:   Minus Breeding MD on 08/01/2010   Method used:   Electronically to        [id number]* (retail)       [address]       [place], [place]  [address]       Ph: [phone]       Fax: [phone]   RxID:   [id number] WELCHOL 625 MG TABS (COLESEVELAM HCL) 6 tabs once daily  #180 x 11   Entered and Authorized by:   Minus Breeding MD   Signed by:   Minus Breeding MD on 08/01/2010   Method used:   Electronically to        [facility]* (retail)       [address]       [place], [place]  [address]       Ph: [phone]       Fax: [phone]   RxID:   [id number] SIMVASTATIN 80 MG TABS (SIMVASTATIN) 1 tab at bedtime  #30 x 11   Entered and Authorized by:   Minus Breeding MD   Signed by:   Minus Breeding MD on 08/01/2010    Method used:   Electronically to        [facility]* (retail)       [address]       [place], [place]  [address]       Ph: [phone]       Fax: [phone]   RxID:   [id number]    Orders Added: 1)  Admin 1st Vaccine [90471] 2)  Flu Vaccine 52yrs + [99833] 3)  EKG w/ Interpretation [93000] 4)  Est. Patient Level III [82505] 5)  Est. Patient 40-64 years [99396]   Flu Vaccine Consent Questions     Do you have a history of severe allergic reactions to this vaccine? no    Any prior history of allergic reactions to egg and/or gelatin? no    Do you have a sensitivity to the preservative Thimersol? no    Do you have a past history of Guillan-Barre Syndrome? no    Do you currently have an acute febrile illness? no    Have you ever had a severe reaction to latex? no    Vaccine information given and explained to patient? yes  Are you currently pregnant? no    Lot Number:[id number]   Exp Date:03/02/2011   Site Given  Right Deltoid IM     .[email or web address]

## 2010-10-04 NOTE — Assessment & Plan Note (Signed)
Summary: 4 mo rov /nws  #   Vital Signs:  Patient profile:   55 year old male Height:      65 inches (165.10 cm) Weight:      164.25 pounds (74.66 kg) O2 Sat:      97 % on Room air Temp:     97.5 degrees F (36.39 degrees C) oral Pulse rate:   83 / minute BP sitting:   118 / 70  (left arm) Cuff size:   regular  Vitals Entered By: Josph Macho RMA (November 23, 2009 7:59 AM)  O2 Flow:  Room air CC: 4 month follow up/ pt states he is not taking the Amaryl 2mg / CF Is Patient Diabetic? Yes   Primary Provider:  Jayvien Rowlette  CC:  4 month follow up/ pt states he is not taking the Amaryl 2mg / CF.  History of Present Illness: the status of 3 chronic medical problems is addressed today: dm:  pt states he feels well in general.  no cbg record, but states cbg's are 80-168.  there is no trend throughout the day, except it is higher after eating.  he says he does take the amaryl nexium controls gerd well.   leg cramps have recurred off the robaxin.  Current Medications (verified): 1)  Vytorin 10-80 Mg  Tabs (Ezetimibe-Simvastatin) .... Take 1 By Mouth Qd 2)  Actoplus Met 15-500 Mg  Tabs (Pioglitazone Hcl-Metformin Hcl) .... Take 2 Qam & 1 Pm By Mouth Qd 3)  Januvia 100 Mg  Tabs (Sitagliptin Phosphate) .... Take 1 By Mouth Qd 4)  Freestyle Test   Strp (Glucose Blood) .... Check Blood Sugars As Directed 5)  Zestril 5 Mg  Tabs (Lisinopril) .... Qd 6)  Amaryl 2 Mg  Tabs (Glimepiride) .Marland Kitchen.. 1 Qam 7)  Adult Aspirin Low Strength 81 Mg Tbdp (Aspirin) .... Take 1 By Mouth Qd 8)  Viagra 100 Mg Tabs (Sildenafil Citrate) .... As Needed Use 9)  Glimepiride 4 Mg Tabs (Glimepiride) .Marland Kitchen.. 1 Qd 10)  Nexium 40 Mg Pack (Esomeprazole Magnesium) .Marland Kitchen.. 1 Qd 11)  Triamcinolone Acetonide 0.025 % Crea (Triamcinolone Acetonide) .... Three Times A Day As Needed Rash  Allergies (verified): No Known Drug Allergies  Past History:  Past Medical History: Last updated: 03/31/2007 Depression Diabetes mellitus, type  II Hypertension Dyspepsia HSV-1 NASH  Review of Systems  The patient denies hypoglycemia.    Physical Exam  General:  normal appearance.   Msk:  Normal muscle tone and bulk.  Normal posture, no vertebral or CVA tenderness.  No joint swelling.  Extremities:  no deformity.  no ulcer on the feet.  feet are of normal color and temp.  no edema  Additional Exam:  Hemoglobin A1C       [H]  7.4 %     Impression & Recommendations:  Problem # 1:  DIABETES MELLITUS, TYPE II (ICD-250.00) HgbA1C: 7.4 (11/20/2009) this is the best control this pt should aim for, given this sulfonylurea-containing regimen.  Problem # 2:  GERD (ICD-530.81) sxs are well-controlled, but cound be masking underlying proble.  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 1 at bedtime for leg cramps. 2)  Ondansetron 4 Mg Tbdp (Ondansetron) .Marland Kitchen.. 1 every 4 hrs as needed for nausea 3)  Ciprofloxacin Hcl 500 Mg Tabs (Ciprofloxacin hcl) .Marland Kitchen.. 1 two times a day.  take if diarrhea  Other Orders: Est. Patient Level IV ([address])  Patient Instructions: 1)  return 4 mos with a1c prior 250.00 2)  same meds 3)  you should have "upper endoscopy."  this is because the nexuim could be covering up something bad like stomach cancer.  please let us know if you decide to do this test. 4)  resume robaxin (methocarbamol) 500 mg at night. 5)  i refilled the cipro and zofran for your upcoming trip [place] Prescriptions: CIPROFLOXACIN HCL 500 MG TABS (CIPROFLOXACIN HCL) 1 two times a day.  take if diarrhea  #14 x 0   Entered and Authorized by:   Minus Breeding MD   Signed by:   Minus Breeding MD on 11/23/2009   Method used:   Electronically to        [facility]* (retail)       [address]       [address] Shipping/mailing       [place], [place]  [address]       Ph: [phone]       Fax:  [phone]   RxID:   [id number] ONDANSETRON 4 MG TBDP (ONDANSETRON) 1 every 4 hrs as needed for nausea  #36 x 0   Entered and Authorized by:   Minus Breeding MD   Signed by:   Minus Breeding MD on 11/23/2009   Method used:   Electronically to        [facility]* (retail)       [address]       [address] Shipping/mailing       [place], [place]  [address]       Ph: [phone]       Fax: [phone]   RxID:   [id number] METHOCARBAMOL 500 MG TABS (METHOCARBAMOL) 1 at bedtime for leg cramps.  #90 x 3   Entered and Authorized by:   Minus Breeding MD   Signed by:   Minus Breeding MD on 11/23/2009   Method used:   Electronically to        [facility]* (retail)       [address]       [address] Shipping/mailing       [place], [place]  [address]       Ph: [phone]       Fax: [phone]   RxID:   [id number]

## 2010-10-04 NOTE — Progress Notes (Signed)
Summary: Rx refill req  Phone Note Refill Request Message from:  Patient on April 06, 2010 1:15 PM  Refills Requested: Medication #1:  FREESTYLE TEST   STRP check blood sugars as directed   Dosage confirmed as above?Dosage Confirmed   Supply Requested: 1 year  Method Requested: Electronic Initial call taken by: Margaret Pyle, CMA,  April 06, 2010 1:15 PM    Prescriptions: FREESTYLE TEST   STRP (GLUCOSE BLOOD) check blood sugars as directed  #150 Each x 2   Entered by:   Margaret Pyle, CMA   Authorized by:   Minus Breeding MD   Signed by:   Margaret Pyle, CMA on 04/06/2010   Method used:   Electronically to        [facility]* (retail)       [address]       [address] Shipping/mailing       [place], [place]  [address]       Ph: [phone]       Fax: [phone]   RxID:   [id number]

## 2010-10-04 NOTE — Assessment & Plan Note (Signed)
Summary: BURNING LOWER EXTREMITIES--STC   Vital Signs:  Patient profile:   55 year old male Height:      65 inches (165.10 cm) Weight:      164.13 pounds (74.60 kg) O2 Sat:      94 % on Room air Temp:     98.7 degrees F (37.06 degrees C) oral Pulse rate:   87 / minute BP sitting:   124 / 72  (left arm) Cuff size:   regular  Vitals Entered By: Josph Macho RMA (Jan 05, 2010 8:00 AM)  O2 Flow:  Room air CC: Burning on top of both feet X4 days/ CF Is Patient Diabetic? Yes   Primary Provider:  Caelin Rosen  CC:  Burning on top of both feet X4 days/ CF.  History of Present Illness: 4 days of mild burning-quality pain at both feet.  no associated numbness.  it is worse at night.  next week, he will go to [place].  Current Medications (verified): 1)  Vytorin 10-80 Mg  Tabs (Ezetimibe-Simvastatin) .... Take 1 By Mouth Qd 2)  Actoplus Met 15-500 Mg  Tabs (Pioglitazone Hcl-Metformin Hcl) .... Take 2 Qam & 1 Pm By Mouth Qd 3)  Januvia 100 Mg  Tabs (Sitagliptin Phosphate) .... Take 1 By Mouth Qd 4)  Freestyle Test   Strp (Glucose Blood) .... Check Blood Sugars As Directed 5)  Zestril 5 Mg  Tabs (Lisinopril) .... Qd 6)  Adult Aspirin Low Strength 81 Mg Tbdp (Aspirin) .... Take 1 By Mouth Qd 7)  Viagra 100 Mg Tabs (Sildenafil Citrate) .... As Needed Use 8)  Glimepiride 4 Mg Tabs (Glimepiride) .Marland Kitchen.. 1 Qd 9)  Nexium 40 Mg Pack (Esomeprazole Magnesium) .Marland Kitchen.. 1 Qd 10)  Triamcinolone Acetonide 0.025 % Crea (Triamcinolone Acetonide) .... Three Times A Day As Needed Rash 11)  Methocarbamol 500 Mg Tabs (Methocarbamol) .Marland Kitchen.. 1 At Bedtime For Leg Cramps. 12)  Ondansetron 4 Mg Tbdp (Ondansetron) .Marland Kitchen.. 1 Every 4 Hrs As Needed For Nausea 13)  Ciprofloxacin Hcl 500 Mg Tabs (Ciprofloxacin Hcl) .Marland Kitchen.. 1 Two Times A Day.  Take If Diarrhea  Allergies (verified): No Known Drug Allergies  Past History:  Past Medical History: Last updated: 03/31/2007 Depression Diabetes mellitus, type  II Hypertension Dyspepsia HSV-1 NASH  Review of Systems  The patient denies fever.         he has a few leg cramps.  no rash.  Physical Exam  General:  normal appearance.   Pulses:  dorsalis pedis intact bilat. Extremities:  no deformity.  no ulcer on the feet.  feet are of normal color and temp.  no edema  Neurologic:  sensation is intact to touch on the feet    Impression & Recommendations:  Problem # 1:  FOOT PAIN, BILATERAL (ICD-729.5) prob neuropathic  Medications Added to Medication List This Visit: 1)  Acetaminophen-codeine #3 300-30 Mg Tabs (Acetaminophen-codeine) .Marland Kitchen.. 1-2 every 4 hrs as needed for pain  Patient Instructions: 1)  trial of tylenol #3:  1-2 every 4 hrn as needed for pain. 2)  also try topical non-prescription pain relievers. 3)  call next week with results, and i'll refill if it is working, or increase to vicodin if not. Prescriptions: ACETAMINOPHEN-CODEINE #3 300-30 MG TABS (ACETAMINOPHEN-CODEINE) 1-2 every 4 hrs as needed for pain  #50 x 0   Entered and Authorized by:   Minus Breeding MD   Signed by:   Minus Breeding MD on 01/05/2010   Method used:  Print then Give to Patient   RxID:   [id number]

## 2010-10-04 NOTE — Progress Notes (Signed)
Summary: ALT med  Phone Note Call from Patient Call back at Work Phone [phone]   Caller: Patient Summary of Call: Pt's spouse called stating that pt has not been taking Welchol due to size and amount of tablets to take at one time. Spouse is requesting to switch back to Vytorin, Rx to [facility] outpt pharmacy. Initial call taken by: Margaret Pyle, CMA,  September 17, 2010 9:30 AM  Follow-up for Phone Call        sent Follow-up by: Minus Breeding MD,  September 18, 2010 8:51 AM    New/Updated Medications: VYTORIN 10-80 MG TABS (EZETIMIBE-SIMVASTATIN) 1 tab once daily Prescriptions: VYTORIN 10-80 MG TABS (EZETIMIBE-SIMVASTATIN) 1 tab once daily  #30 x 11   Entered by:   Margaret Pyle, CMA   Authorized by:   Minus Breeding MD   Signed by:   Margaret Pyle, CMA on 09/18/2010   Method used:   Electronically to        [facility]* (retail)       [address]       [address] Shipping/mailing       [place], [place]  [address]       Ph: [phone]       Fax: [phone]   RxID:   [id number] VYTORIN 10-80 MG TABS (EZETIMIBE-SIMVASTATIN) 1 tab once daily  #30 x 11   Entered and Authorized by:   Minus Breeding MD   Signed by:   Minus Breeding MD on 09/18/2010   Method used:   Electronically to        [facility] [address] [id number]* (retail)       [address]       [place], [place]  [address]       Ph: [phone]       Fax: [phone]   RxID:   [id number]

## 2010-10-04 NOTE — Progress Notes (Signed)
Summary: leg pain  Phone Note Call from Patient Call back at Work Phone [phone]   Caller: Patient Summary of Call: pt called stating that he is having bilateral burning pain in LE. Pt is requesting Rx for pain. Initial call taken by: Margaret Pyle, CMA,  Jan 04, 2010 9:08 AM  Follow-up for Phone Call        please advise ov Follow-up by: Minus Breeding MD,  Jan 04, 2010 10:19 AM  Additional Follow-up for Phone Call Additional follow up Details #1::        pt has appt scheduled Additional Follow-up by: Margaret Pyle, CMA,  Jan 04, 2010 11:12 AM

## 2010-10-31 ENCOUNTER — Ambulatory Visit (INDEPENDENT_AMBULATORY_CARE_PROVIDER_SITE_OTHER): Payer: Commercial Managed Care - PPO | Admitting: Endocrinology

## 2010-10-31 ENCOUNTER — Encounter: Payer: Self-pay | Admitting: Endocrinology

## 2010-10-31 ENCOUNTER — Other Ambulatory Visit: Payer: Self-pay | Admitting: Endocrinology

## 2010-10-31 ENCOUNTER — Other Ambulatory Visit: Payer: Commercial Managed Care - PPO

## 2010-10-31 DIAGNOSIS — E7800 Pure hypercholesterolemia, unspecified: Secondary | ICD-10-CM | POA: Insufficient documentation

## 2010-10-31 DIAGNOSIS — E119 Type 2 diabetes mellitus without complications: Secondary | ICD-10-CM

## 2010-10-31 HISTORY — DX: Pure hypercholesterolemia, unspecified: E78.00

## 2010-10-31 LAB — LIPID PANEL
Cholesterol: 92 mg/dL (ref 0–200)
VLDL: 20.2 mg/dL (ref 0.0–40.0)

## 2010-10-31 LAB — HEMOGLOBIN A1C: Hgb A1c MFr Bld: 7.3 % — ABNORMAL HIGH (ref 4.6–6.5)

## 2010-11-13 NOTE — Assessment & Plan Note (Signed)
Summary: 3 MO FU NWS #   Vital Signs:  Patient profile:   55 year old male Height:      66 inches Weight:      160.13 pounds BMI:     25.94 O2 Sat:      97 % on Room air Temp:     97.8 degrees F oral Pulse rate:   80 / minute BP sitting:   118 / 70  (left arm) Cuff size:   regular  Vitals Entered By: Zella Ball Ewing CMA (AAMA) (October 31, 2010 7:59 AM)  O2 Flow:  Room air CC: 3 month followup/RE   Primary Provider:  Keina Mutch  CC:  3 month followup/RE.  History of Present Illness: the status of at least 3 ongoing medical problems is addressed today: dm: pt states he feels well in general.  no cbg record, but states cbg's are well-controlled.  no hypoglycemic sxs.  he does not want to take parlodel. dyslipidemia:  pt says he coulf not tolerate resin, but he likes the vytorin.   depression:  he says this is mild, and he declines rx for this.    Current Medications (verified): 1)  Actoplus Met 15-500 Mg  Tabs (Pioglitazone Hcl-Metformin Hcl) .... Take 2 Qam & 1 Pm By Mouth Qd 2)  Januvia 100 Mg  Tabs (Sitagliptin Phosphate) .... Take 1 By Mouth Qd 3)  Freestyle Test   Strp (Glucose Blood) .... Check Blood Sugars As Directed 4)  Zestril 5 Mg  Tabs (Lisinopril) .... Qd 5)  Adult Aspirin Low Strength 81 Mg Tbdp (Aspirin) .... Take 1 By Mouth Qd 6)  Viagra 100 Mg Tabs (Sildenafil Citrate) .... As Needed Use 7)  Nexium 40 Mg Pack (Esomeprazole Magnesium) .Marland Kitchen.. 1 Qd 8)  Triamcinolone Acetonide 0.025 % Crea (Triamcinolone Acetonide) .... Three Times A Day As Needed Rash 9)  Methocarbamol 500 Mg Tabs (Methocarbamol) .Marland Kitchen.. 1 At Bedtime For Leg Cramps. 10)  Hydrocodone-Acetaminophen 10-325 Mg Tabs (Hydrocodone-Acetaminophen) .... 1/2-1 Every 4 Hrs As Needed For Pain 11)  Simvastatin 80 Mg Tabs (Simvastatin) .Marland Kitchen.. 1 Tab At Bedtime 12)  Glimepiride 4 Mg Tabs (Glimepiride) .Marland Kitchen.. 1 Tab Each Am 13)  Vytorin 10-80 Mg Tabs (Ezetimibe-Simvastatin) .Marland Kitchen.. 1 Tab Once Daily  Allergies (verified): No  Known Drug Allergies  Past History:  Past Medical History: Last updated: 03/31/2007 Depression Diabetes mellitus, type II Hypertension Dyspepsia HSV-1 NASH  Review of Systems  The patient denies weight loss and weight gain.    Physical Exam  General:  normal appearance.   Extremities:  no edema Neurologic:  Alert and oriented x 3. Normal gait without ataxia.  Additional Exam:  Cholesterol               92 mg/dL                    2-130   Triglycerides             101.0 mg/dL                 8.6-578.4   HDL                  [L]  69.62 mg/dL                 >95.28   VLDL Cholesterol          20.2 mg/dL                  4.1-32.0  LDL Cholesterol           37 mg/dL                    9-62   Hemoglobin A1C       [H]  7.3 %     Impression & Recommendations:  Problem # 1:  DIABETES MELLITUS, TYPE II (ICD-250.00) this is the best control this pt should aim for, given this sulfonylurea-containing regimen  Problem # 2:  DEPRESSION (ICD-311) mild pt declines rx.  Problem # 3:  HYPERCHOLESTEROLEMIA (ICD-272.0) well-controlled  Medications Added to Medication List This Visit: 1)  Onetouch Ultra Blue Strp (Glucose blood) .... Two times a day, and lancets 250.00  Other Orders: TLB-Lipid Panel (80061-LIPID) TLB-A1C / Hgb A1C (Glycohemoglobin) (83036-A1C) Est. Patient Level IV ([address])  Patient Instructions: 1)  blood tests are being ordered for you today.  please call [phone] to hear your test results. 2)  pending the test results, please continue the same medications for now. 3)  Please schedule a follow-up appointment in 3 months. 4)  if today's a1c is elevated, options (other than insulin) are changing januvia to byetta, or adding welchol powder.   5)  (update: i left message on phone-tree:  same rx for now). Prescriptions: GLIMEPIRIDE 4 MG TABS (GLIMEPIRIDE) 1 tab each am  #90 x 3   Entered and Authorized by:   Minus Breeding MD   Signed by:   Minus Breeding MD on  10/31/2010   Method used:   Electronically to        [facility]* (retail)       [address]       [address] Shipping/mailing       [place], [place]  [address]       Ph: [phone]       Fax: [phone]   RxID:   [id number] ONETOUCH ULTRA BLUE  STRP (GLUCOSE BLOOD) two times a day, and lancets 250.00  #100 x 5   Entered and Authorized by:   Minus Breeding MD   Signed by:   Minus Breeding MD on 10/31/2010   Method used:   Electronically to        [facility]* (retail)       [address]       [address] Shipping/mailing       [place], [place]  [address]       Ph: [phone]       Fax: [phone]   RxID:   [id number]    Orders Added: 1)  TLB-Lipid Panel [80061-LIPID] 2)  TLB-A1C / Hgb A1C (Glycohemoglobin) [83036-A1C] 3)  Est. Patient Level IV [[address]]

## 2010-12-11 ENCOUNTER — Other Ambulatory Visit: Payer: Self-pay | Admitting: Endocrinology

## 2010-12-11 MED ORDER — LISINOPRIL 5 MG PO TABS: 5.0000 mg | ORAL_TABLET | Freq: Every day | ORAL | Status: DC

## 2010-12-11 NOTE — Telephone Encounter (Signed)
R'cd fax from [facility] for refill of pt's Lisinopril  Last OV-10/31/2010  Last Filled-09/17/2010

## 2010-12-14 ENCOUNTER — Telehealth: Payer: Self-pay

## 2010-12-14 MED ORDER — ATORVASTATIN CALCIUM 40 MG PO TABS: 40.0000 mg | ORAL_TABLET | Freq: Every day | ORAL | Status: DC

## 2010-12-14 NOTE — Telephone Encounter (Signed)
Ok to stop the vytorin  Start the lipitor 40 mg per day  Return for LAB only in 4 wks:   Lipids 272.0                                                   heptatic function panel  V58.69  Dahlia to inform pt

## 2010-12-14 NOTE — Telephone Encounter (Signed)
Pt is currently prescribed Vytorin 10/80mg . Revised doing guidelines from the manufacturer require PA for Pts taking >1 yr or Pts new to therapy. [facility] formulary - pravastatin=Free; atorvastatin=Free; crestor= $7/30-day or $14/90 -day. Please advise on how you wish to handle.

## 2010-12-14 NOTE — Telephone Encounter (Addendum)
Pt's spouse called stating that Vytorin in so longer covered by Insurance and pt is requesting alternative.  Paperwork from [employer] on MD's desk for review.

## 2010-12-16 NOTE — Telephone Encounter (Signed)
i addressed on the paper form

## 2011-01-25 ENCOUNTER — Other Ambulatory Visit (INDEPENDENT_AMBULATORY_CARE_PROVIDER_SITE_OTHER): Payer: 59

## 2011-01-25 ENCOUNTER — Ambulatory Visit (INDEPENDENT_AMBULATORY_CARE_PROVIDER_SITE_OTHER): Payer: 59 | Admitting: Endocrinology

## 2011-01-25 ENCOUNTER — Encounter: Payer: Self-pay | Admitting: Endocrinology

## 2011-01-25 ENCOUNTER — Ambulatory Visit (INDEPENDENT_AMBULATORY_CARE_PROVIDER_SITE_OTHER)
Admission: RE | Admit: 2011-01-25 | Discharge: 2011-01-25 | Disposition: A | Discharge status: Home / Self Care | Payer: 59 | Source: Ambulatory Visit | Attending: Endocrinology | Admitting: Endocrinology

## 2011-01-25 ENCOUNTER — Ambulatory Visit
Admission: RE | Admit: 2011-01-25 | Discharge: 2011-01-25 | Disposition: A | Discharge status: Home / Self Care | Payer: 59 | Source: Ambulatory Visit | Attending: Endocrinology | Admitting: Endocrinology

## 2011-01-25 VITALS — BP 112/74 | HR 80 | Temp 98.6°F | Ht 66.5 in | Wt 159.4 lb

## 2011-01-25 DIAGNOSIS — M25511 Pain in right shoulder: Secondary | ICD-10-CM | POA: Insufficient documentation

## 2011-01-25 DIAGNOSIS — M25512 Pain in left shoulder: Secondary | ICD-10-CM

## 2011-01-25 DIAGNOSIS — E119 Type 2 diabetes mellitus without complications: Secondary | ICD-10-CM

## 2011-01-25 DIAGNOSIS — M25519 Pain in unspecified shoulder: Secondary | ICD-10-CM

## 2011-01-25 MED ORDER — ONDANSETRON HCL 4 MG PO TABS: 4.0000 mg | ORAL_TABLET | Freq: Three times a day (TID) | ORAL | Status: AC | PRN

## 2011-01-25 MED ORDER — HYDROCODONE-ACETAMINOPHEN 10-325 MG PO TABS: ORAL_TABLET | ORAL | Status: DC

## 2011-01-25 MED ORDER — CIPROFLOXACIN HCL 500 MG PO TABS: 500.0000 mg | ORAL_TABLET | Freq: Two times a day (BID) | ORAL | Status: AC

## 2011-01-25 NOTE — Progress Notes (Signed)
Subjective:    Patient ID: Elijah Cervantes, male    DOB: [birth year], 55 y.o.   MRN: [id number]  HPI Pt states 2 mos of moderate pain at the left shoulder, but no assoc numbness.  Pain radiates to the left ring and little fingers. It started when he was working on his home, but he says he did not fall.   Past Medical History  Diagnosis Date  . DIABETES MELLITUS, TYPE II 03/31/2007  . DEPRESSION 03/31/2007  . HYPERTENSION 03/31/2007  . GERD 07/26/2009  . FATTY LIVER DISEASE 07/18/2008  . 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 HYPERCHOLESTEROLEMIA 10/31/2010  . Dyspepsia   . NASH (nonalcoholic steatohepatitis)   . HSV-1 infection   . ABNORMAL ELECTROCARDIOGRAM 07/26/2009    Past Surgical History  Procedure Date  . Stress cardiolite 11/22/2003    History   Social History  . Marital Status: Married    Spouse Name: N/A    Number of Children: N/A  . Years of Education: N/A   Occupational History  . Unemployed    Social History Main Topics  . Smoking status: Never Smoker   . Smokeless tobacco: Not on file  . Alcohol Use: Not on file  . Drug Use: Not on file  . Sexually Active: Not on file   Other Topics Concern  . Not on file   Social History Narrative  . No narrative on file    Current Outpatient Prescriptions on File Prior to Visit  Medication Sig Dispense Refill  . aspirin 81 MG tablet Take 1 tablet (81 mg total) by mouth daily.  30 tablet  0  . atorvastatin (LIPITOR) 40 MG tablet Take 1 tablet (40 mg total) by mouth daily.  90 tablet  3  . esomeprazole (NEXIUM) 40 MG packet 1 by mouth once daily  30 each  0  . glimepiride (AMARYL) 4 MG tablet Take 1 tablet (4 mg total) by mouth daily before breakfast.  30 tablet  0  . glucose blood (ONE TOUCH ULTRA TEST) test strip Two times a day, dx 250.00  100 each  0  . lisinopril (PRINIVIL,ZESTRIL) 5 MG tablet Take 1 tablet (5 mg total) by mouth daily.  90 tablet  1  . methocarbamol (ROBAXIN) 500 MG tablet 1 tablet at  bedtime for leg cramps  30 tablet  0  . pioglitazone-metformin (ACTOPLUS MET) 15-500 MG per tablet Take 2 tablets by mouth every morning and 1 tablet by mouth every evening  60 tablet  0  . sitaGLIPtan (JANUVIA) 100 MG tablet Take 1 tablet (100 mg total) by mouth daily.  30 tablet  0  . triamcinolone (KENALOG) 0.025 % ointment Apply to affected area three times a day as needed for rash  30 g  0  . sildenafil (VIAGRA) 100 MG tablet Take 1 tablet (100 mg total) by mouth as needed for erectile dysfunction.  20 tablet  0    No Known Allergies  Family History  Problem Relation Age of Onset  . Diabetes Mother   . Diabetes Brother   . Cancer Neg Hx     BP 112/74  Pulse 80  Temp(Src) 98.6 F (37 C) (Oral)  Ht 5' 6.5" (1.689 m)  Wt 159 lb 6.4 oz (72.303 kg)  BMI 25.34 kg/m2  SpO2 96%    Review of Systems Denies hypoglycemia and weight change.    Objective:   Physical Exam GENERAL: no distress Left shoulder:  Nontender.  full rom without pain. Left hand:  sensation is intact to touch. Left radial pulse is intact.      Lab Results  Component Value Date   HGBA1C 8.0* 01/25/2011      Assessment & Plan:  Persistent right shoulder pain Dm, needs increased rx

## 2011-01-25 NOTE — Patient Instructions (Addendum)
Here is a refill of your pain medication. blood tests and an x-ray, are being ordered for you today.  please call [phone] to hear your test results.  You will be prompted to enter the 9-digit "MRN" number that appears at the top left of this page, followed by #.  Then you will hear the message. Please make a follow-up appointment in 3 months Refer to dr supple. you will be called with a day and time for an appointment. check your blood sugar 2 time a day.  vary the time of day when you check, between before the 3 meals, and at bedtime.  also check if you have symptoms of your blood sugar being too high or too low.  please keep a record of the readings and bring it to your next appointment here.  please call us sooner if you are having low blood sugar episodes. i left message on phone tree: i advised add parlodel).

## 2011-01-30 ENCOUNTER — Other Ambulatory Visit: Payer: Self-pay | Admitting: *Deleted

## 2011-01-30 MED ORDER — METHOCARBAMOL 500 MG PO TABS: ORAL_TABLET | ORAL | Status: DC

## 2011-01-30 NOTE — Telephone Encounter (Signed)
R'cd fax from [facility] pharmacy for refill of Methocarbamol  Last OV-01/25/2011  Last Filled-11/23/2009

## 2011-05-03 ENCOUNTER — Encounter: Payer: Self-pay | Admitting: Endocrinology

## 2011-05-03 ENCOUNTER — Ambulatory Visit (INDEPENDENT_AMBULATORY_CARE_PROVIDER_SITE_OTHER): Payer: 59 | Admitting: Endocrinology

## 2011-05-03 ENCOUNTER — Other Ambulatory Visit (INDEPENDENT_AMBULATORY_CARE_PROVIDER_SITE_OTHER): Payer: 59

## 2011-05-03 VITALS — BP 110/70 | HR 76 | Temp 99.2°F | Ht 66.0 in | Wt 148.0 lb

## 2011-05-03 DIAGNOSIS — E119 Type 2 diabetes mellitus without complications: Secondary | ICD-10-CM

## 2011-05-03 LAB — HEMOGLOBIN A1C: Hgb A1c MFr Bld: 7.1 % — ABNORMAL HIGH (ref 4.6–6.5)

## 2011-05-03 MED ORDER — GLIMEPIRIDE 2 MG PO TABS: 2.0000 mg | ORAL_TABLET | Freq: Every day | ORAL | Status: DC

## 2011-05-03 MED ORDER — TRAMADOL-ACETAMINOPHEN 37.5-325 MG PO TABS
1.0000 | ORAL_TABLET | Freq: Four times a day (QID) | ORAL | Status: AC | PRN
Start: 2011-05-03 — End: 2011-05-13

## 2011-05-03 NOTE — Progress Notes (Signed)
Subjective:    Patient ID: Elijah Cervantes, male    DOB: 03/31/1956, 55 y.o.   MRN: [id number]  HPI pt states he feels well in general.  He does not check cbg's Left sholder pain is only slightly improved.  He has had this x 6 mos, and there is no assoc numbness.  He got a steroid injection at orthopedics.   Past Medical History  Diagnosis Date  . DIABETES MELLITUS, TYPE II 03/31/2007  . DEPRESSION 03/31/2007  . HYPERTENSION 03/31/2007  . GERD 07/26/2009  . FATTY LIVER DISEASE 07/18/2008  . 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 HYPERCHOLESTEROLEMIA 10/31/2010  . Dyspepsia   . NASH (nonalcoholic steatohepatitis)   . HSV-1 infection   . ABNORMAL ELECTROCARDIOGRAM 07/26/2009    Past Surgical History  Procedure Date  . Stress cardiolite 11/22/2003    History   Social History  . Marital Status: Married    Spouse Name: N/A    Number of Children: N/A  . Years of Education: N/A   Occupational History  . Unemployed    Social History Main Topics  . Smoking status: Never Smoker   . Smokeless tobacco: Not on file  . Alcohol Use: Not on file  . Drug Use: Not on file  . Sexually Active: Not on file   Other Topics Concern  . Not on file   Social History Narrative  . No narrative on file    Current Outpatient Prescriptions on File Prior to Visit  Medication Sig Dispense Refill  . aspirin 81 MG tablet Take 1 tablet (81 mg total) by mouth daily.  30 tablet  0  . atorvastatin (LIPITOR) 40 MG tablet Take 1 tablet (40 mg total) by mouth daily.  90 tablet  3  . esomeprazole (NEXIUM) 40 MG packet 1 by mouth once daily  30 each  0  . glucose blood (ONE TOUCH ULTRA TEST) test strip Two times a day, dx 250.00  100 each  0  . HYDROcodone-acetaminophen (NORCO) 10-325 MG per tablet 1/2-1 tablet every 4 hours as needed for pain  50 tablet  1  . lisinopril (PRINIVIL,ZESTRIL) 5 MG tablet Take 1 tablet (5 mg total) by mouth daily.  90 tablet  1  . methocarbamol (ROBAXIN) 500 MG tablet 1  tablet at bedtime for leg cramps  90 tablet  1  . pioglitazone-metformin (ACTOPLUS MET) 15-500 MG per tablet Take 2 tablets by mouth every morning and 1 tablet by mouth every evening  60 tablet  0  . sitaGLIPtan (JANUVIA) 100 MG tablet Take 1 tablet (100 mg total) by mouth daily.  30 tablet  0  . triamcinolone (KENALOG) 0.025 % ointment Apply to affected area three times a day as needed for rash  30 g  0    No Known Allergies  Family History  Problem Relation Age of Onset  . Diabetes Mother   . Diabetes Brother   . Cancer Neg Hx    BP 110/70  Pulse 76  Temp(Src) 99.2 F (37.3 C) (Oral)  Ht 5\' 6"  (1.676 m)  Wt 148 lb (67.132 kg)  BMI 23.89 kg/m2  SpO2 96%  Review of Systems denies hypoglycemia.  He has lost a few lbs, due to his efforts.      Objective:   Physical Exam Left shoulder: full rom, but rom is painful.   Pulses: dorsalis pedis intact bilat.   Feet: no deformity.  no ulcer on the feet.  feet are of normal color and temp.  no edema. Neuro: sensation is intact to touch on the feet.  Lab Results  Component Value Date   HGBA1C 7.1* 05/03/2011      Assessment & Plan:  Dm, overcontrolled, for this sulfonylurea-containing regimen. Shoulder pain, persistent

## 2011-05-03 NOTE — Patient Instructions (Addendum)
blood tests are being ordered for you today.  please call [phone] to hear your test results.  You will be prompted to enter the 9-digit "MRN" number that appears at the top left of this page, followed by #.  Then you will hear the message. Please make a regular physical appointment in 3 months. Based on the results, i may advise 1 of these 3: adding welchol powder, adding bromocriptine, or changing januvia to victoza (an easy-to-take, once a day injection) check your blood sugar 2 time a day.  vary the time of day when you check, between before the 3 meals, and at bedtime.  also check if you have symptoms of your blood sugar being too high or too low.  please keep a record of the readings and bring it to your next appointment here.  please call us sooner if you are having low blood sugar episodes. i have sent a prescription to your pharmacy for a pain medication.   (update: i left message on phone-tree:  Reduce amaryl to 2 mg qam)

## 2011-05-07 ENCOUNTER — Telehealth: Payer: Self-pay | Admitting: *Deleted

## 2011-05-07 DIAGNOSIS — E119 Type 2 diabetes mellitus without complications: Secondary | ICD-10-CM

## 2011-05-07 DIAGNOSIS — Z0389 Encounter for observation for other suspected diseases and conditions ruled out: Secondary | ICD-10-CM

## 2011-05-07 DIAGNOSIS — Z0000 Encounter for general adult medical examination without abnormal findings: Secondary | ICD-10-CM

## 2011-05-07 NOTE — Telephone Encounter (Signed)
Message copied by Carin Primrose on Tue May 07, 2011  1:28 PM ------      Message from: Pincus Sanes      Created: Tue May 07, 2011  8:17 AM      Regarding: FW: PHYSICAL LABS                   ----- Message -----         From: Etheleen Sia         Sent: 05/03/2011   5:00 PM           To: Zella Ball Ewing      Subject: PHYSICAL LABS                                            SCHEDULED FOR  CPX ON DEC 7

## 2011-05-07 NOTE — Telephone Encounter (Signed)
Lab orders for upcoming  CPX placed into Epic

## 2011-06-19 ENCOUNTER — Ambulatory Visit: Payer: 59

## 2011-06-19 ENCOUNTER — Other Ambulatory Visit: Payer: Self-pay | Admitting: Endocrinology

## 2011-08-09 ENCOUNTER — Ambulatory Visit (INDEPENDENT_AMBULATORY_CARE_PROVIDER_SITE_OTHER): Payer: 59 | Admitting: Endocrinology

## 2011-08-09 ENCOUNTER — Encounter: Payer: Self-pay | Admitting: Endocrinology

## 2011-08-09 ENCOUNTER — Other Ambulatory Visit (INDEPENDENT_AMBULATORY_CARE_PROVIDER_SITE_OTHER): Payer: 59

## 2011-08-09 ENCOUNTER — Other Ambulatory Visit: Payer: Self-pay | Admitting: Endocrinology

## 2011-08-09 VITALS — BP 122/74 | HR 82 | Temp 98.7°F | Wt 155.8 lb

## 2011-08-09 DIAGNOSIS — E119 Type 2 diabetes mellitus without complications: Secondary | ICD-10-CM

## 2011-08-09 DIAGNOSIS — Z0000 Encounter for general adult medical examination without abnormal findings: Secondary | ICD-10-CM

## 2011-08-09 DIAGNOSIS — Z0389 Encounter for observation for other suspected diseases and conditions ruled out: Secondary | ICD-10-CM

## 2011-08-09 DIAGNOSIS — Z23 Encounter for immunization: Secondary | ICD-10-CM

## 2011-08-09 LAB — URINALYSIS, ROUTINE W REFLEX MICROSCOPIC
Bilirubin Urine: NEGATIVE
Hgb urine dipstick: NEGATIVE
Ketones, ur: NEGATIVE
Leukocytes, UA: NEGATIVE
Total Protein, Urine: NEGATIVE
pH: 6 (ref 5.0–8.0)

## 2011-08-09 LAB — MICROALBUMIN / CREATININE URINE RATIO
Creatinine,U: 173.1 mg/dL
Microalb Creat Ratio: 4.3 mg/g (ref 0.0–30.0)

## 2011-08-09 LAB — HEMOGLOBIN A1C: Hgb A1c MFr Bld: 7.5 % — ABNORMAL HIGH (ref 4.6–6.5)

## 2011-08-09 LAB — BASIC METABOLIC PANEL
Calcium: 9.3 mg/dL (ref 8.4–10.5)
GFR: 82.97 mL/min (ref >60.00)
Potassium: 4 mEq/L (ref 3.5–5.1)
Sodium: 140 mEq/L (ref 135–145)

## 2011-08-09 LAB — CBC WITH DIFFERENTIAL/PLATELET
Basophils Absolute: 0 10*3/uL (ref 0.0–0.1)
HCT: 44.6 % (ref 39.0–52.0)
Hemoglobin: 15.1 g/dL (ref 13.0–17.0)
Lymphs Abs: 2.5 10*3/uL (ref 0.7–4.0)
MCHC: 33.8 g/dL (ref 30.0–36.0)
MCV: 93.6 fl (ref 78.0–100.0)
Monocytes Absolute: 0.4 10*3/uL (ref 0.1–1.0)
Monocytes Relative: 5.3 % (ref 3.0–12.0)
Neutro Abs: 4 10*3/uL (ref 1.4–7.7)
RDW: 13.2 % (ref 11.5–14.6)

## 2011-08-09 LAB — HEPATIC FUNCTION PANEL
AST: 29 U/L (ref 0–37)
Albumin: 4.3 g/dL (ref 3.5–5.2)
Alkaline Phosphatase: 56 U/L (ref 39–117)
Bilirubin, Direct: 0.2 mg/dL (ref 0.0–0.3)
Total Bilirubin: 1.1 mg/dL (ref 0.3–1.2)

## 2011-08-09 LAB — LIPID PANEL
HDL: 36.9 mg/dL — ABNORMAL LOW (ref >39.00)
Triglycerides: 94 mg/dL (ref 0.0–149.0)

## 2011-08-09 LAB — TSH: TSH: 0.76 u[IU]/mL (ref 0.35–5.50)

## 2011-08-09 NOTE — Patient Instructions (Addendum)
blood tests are being requested for you today.  please call [phone] to hear your test results.  You will be prompted to enter the 9-digit "MRN" number that appears at the top left of this page, followed by #.  Then you will hear the message. please consider these measures for your health:  minimize alcohol.  do not use tobacco products.  have a colonoscopy at least every 10 years from age 55. keep firearms safely stored.  always use seat belts.  have working smoke alarms in your home.  see an eye doctor and dentist regularly.  never drive under the influence of alcohol or drugs (including prescription drugs).  those with fair skin should take precautions against the sun. Please come back for a follow-up appointment in 6 months Call if you decide to get the colonoscopy done.  This is very important, as it can reduce your changes of dying of cancer.

## 2011-08-09 NOTE — Progress Notes (Signed)
Subjective:    Patient ID: Elijah Cervantes, male    DOB: August 29, 1956, 55 y.o.   MRN: [id number]  HPI here for regular wellness examination.  He's feeling pretty well in general, and says chronic med probs are stable, except as noted below.  He denies hypoglycemia Past Medical History  Diagnosis Date  . DIABETES MELLITUS, TYPE II 03/31/2007  . DEPRESSION 03/31/2007  . HYPERTENSION 03/31/2007  . GERD 07/26/2009  . FATTY LIVER DISEASE 07/18/2008  . 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 HYPERCHOLESTEROLEMIA 10/31/2010  . Dyspepsia   . NASH (nonalcoholic steatohepatitis)   . HSV-1 infection   . ABNORMAL ELECTROCARDIOGRAM 07/26/2009    Past Surgical History  Procedure Date  . Stress cardiolite 11/22/2003    History   Social History  . Marital Status: Married    Spouse Name: N/A    Number of Children: N/A  . Years of Education: N/A   Occupational History  . Unemployed    Social History Main Topics  . Smoking status: Never Smoker   . Smokeless tobacco: Not on file  . Alcohol Use: Not on file  . Drug Use: Not on file  . Sexually Active: Not on file   Other Topics Concern  . Not on file   Social History Narrative  . No narrative on file    Current Outpatient Prescriptions on File Prior to Visit  Medication Sig Dispense Refill  . aspirin 81 MG tablet Take 1 tablet (81 mg total) by mouth daily.  30 tablet  0  . atorvastatin (LIPITOR) 40 MG tablet Take 1 tablet (40 mg total) by mouth daily.  90 tablet  3  . esomeprazole (NEXIUM) 40 MG packet 1 by mouth once daily  30 each  0  . glimepiride (AMARYL) 2 MG tablet Take 1 tablet (2 mg total) by mouth daily before breakfast.  90 tablet  3  . glucose blood (ONE TOUCH ULTRA TEST) test strip Two times a day, dx 250.00  100 each  0  . HYDROcodone-acetaminophen (NORCO) 10-325 MG per tablet 1/2-1 tablet every 4 hours as needed for pain  50 tablet  1  . lisinopril (PRINIVIL,ZESTRIL) 5 MG tablet TAKE 1 TABLET (5 MG TOTAL) BY MOUTH  DAILY.  90 tablet  2  . methocarbamol (ROBAXIN) 500 MG tablet 1 tablet at bedtime for leg cramps  90 tablet  1  . triamcinolone (KENALOG) 0.025 % ointment Apply to affected area three times a day as needed for rash  30 g  0    No Known Allergies  Family History  Problem Relation Age of Onset  . Diabetes Mother   . Diabetes Brother   . Cancer Neg Hx     BP 122/74  Pulse 82  Temp(Src) 98.7 F (37.1 C) (Oral)  Wt 155 lb 12.8 oz (70.67 kg)  SpO2 96%    Review of Systems  Constitutional: Negative for fever and unexpected weight change.  HENT: Negative for hearing loss.   Eyes: Negative for visual disturbance.  Respiratory: Negative for shortness of breath.   Cardiovascular: Negative for chest pain.  Gastrointestinal: Negative for anal bleeding.  Genitourinary: Negative for hematuria and difficulty urinating.  Musculoskeletal: Negative for back pain.  Skin: Negative for rash.  Neurological: Negative for syncope, numbness and headaches.  Hematological: Does not bruise/bleed easily.  Psychiatric/Behavioral: Negative for dysphoric mood.       Objective:   Physical Exam VS: see vs page GEN: no distress HEAD: head: no deformity eyes: no  periorbital swelling, no proptosis external nose and ears are normal mouth: no lesion seen NECK: supple, thyroid is not enlarged CHEST WALL: no deformity LUNGS: clear to auscultation BREASTS:  No gynecomastia CV: reg rate and rhythm, no murmur ABD: abdomen is soft, nontender.  no hepatosplenomegaly.  not distended.  no hernia  RECTAL: normal external and internal exam.  heme neg. PROSTATE:  Normal size.  No nodule MUSCULOSKELETAL: muscle bulk and strength are grossly normal.  no obvious joint swelling.  gait is normal and steady EXTEMITIES: no deformity.  no ulcer on the feet.  feet are of normal color and temp.  no edema PULSES: dorsalis pedis intact bilat.  no carotid bruit NEURO:  cn 2-12 grossly intact.   readily moves all 4's.   sensation is intact to touch on the feet SKIN:  Normal texture and temperature.  No rash or suspicious lesion is visible.   NODES:  None palpable at the neck. PSYCH: alert, oriented x3.  Does not appear anxious nor depressed.   Lab Results  Component Value Date   WBC 6.9 08/09/2011   HGB 15.1 08/09/2011   HCT 44.6 08/09/2011   PLT 239.0 08/09/2011   GLUCOSE 190* 08/09/2011   CHOL 117 08/09/2011   TRIG 94.0 08/09/2011   HDL 36.90* 08/09/2011   LDLDIRECT 111.2 07/30/2010   LDLCALC 61 08/09/2011   ALT 38 08/09/2011   AST 29 08/09/2011   NA 140 08/09/2011   K 4.0 08/09/2011   CL 102 08/09/2011   CREATININE 1.0 08/09/2011   BUN 11 08/09/2011   CO2 30 08/09/2011   TSH 0.76 08/09/2011   PSA 1.48 08/09/2011   HGBA1C 7.5* 08/09/2011   MICROALBUR 7.4* 08/09/2011      Assessment & Plan:  Wellness visit today, with problems stable.

## 2011-08-10 DIAGNOSIS — Z0000 Encounter for general adult medical examination without abnormal findings: Secondary | ICD-10-CM | POA: Insufficient documentation

## 2011-12-13 ENCOUNTER — Other Ambulatory Visit: Payer: Self-pay | Admitting: Endocrinology

## 2012-02-07 ENCOUNTER — Ambulatory Visit: Payer: 59 | Admitting: Endocrinology

## 2012-02-11 ENCOUNTER — Other Ambulatory Visit: Payer: Self-pay | Admitting: Internal Medicine

## 2012-02-27 ENCOUNTER — Ambulatory Visit: Payer: 59 | Admitting: Endocrinology

## 2012-02-28 ENCOUNTER — Ambulatory Visit: Payer: 59 | Admitting: Endocrinology

## 2012-03-11 ENCOUNTER — Other Ambulatory Visit: Payer: Self-pay | Admitting: Endocrinology

## 2012-04-20 ENCOUNTER — Other Ambulatory Visit: Payer: Self-pay | Admitting: Endocrinology

## 2012-04-24 ENCOUNTER — Other Ambulatory Visit (INDEPENDENT_AMBULATORY_CARE_PROVIDER_SITE_OTHER): Payer: 59

## 2012-04-24 ENCOUNTER — Encounter: Payer: Self-pay | Admitting: Endocrinology

## 2012-04-24 ENCOUNTER — Ambulatory Visit (INDEPENDENT_AMBULATORY_CARE_PROVIDER_SITE_OTHER): Payer: 59 | Admitting: Endocrinology

## 2012-04-24 VITALS — BP 110/62 | HR 80 | Temp 98.2°F | Ht 65.0 in | Wt 151.0 lb

## 2012-04-24 DIAGNOSIS — M79609 Pain in unspecified limb: Secondary | ICD-10-CM

## 2012-04-24 DIAGNOSIS — E119 Type 2 diabetes mellitus without complications: Secondary | ICD-10-CM

## 2012-04-24 DIAGNOSIS — K7689 Other specified diseases of liver: Secondary | ICD-10-CM

## 2012-04-24 LAB — HEPATITIS B SURFACE ANTIBODY,QUALITATIVE: Hep B S Ab: POSITIVE — AB

## 2012-04-24 LAB — HEPATITIS B SURFACE ANTIGEN: Hepatitis B Surface Ag: NEGATIVE

## 2012-04-24 MED ORDER — HYDROCODONE-ACETAMINOPHEN 10-325 MG PO TABS: ORAL_TABLET | ORAL | Status: DC

## 2012-04-24 NOTE — Progress Notes (Signed)
Subjective:    Patient ID: Elijah Cervantes, male    DOB: 03/21/1956, 56 y.o.   MRN: [id number]  HPI The state of at least three ongoing medical problems is addressed today: Pt returns for f/u of type 2 DM (dx'ed 1996; no known complications; he has declined parlodel and welchol).  no cbg record, but states cbg's are well-controlled pt says he is at risk for gout, but he has no sxs. [place] he is at risk for hepatitis-b.  This is in turn a risk factor for diabetes. Past Medical History  Diagnosis Date  . DIABETES MELLITUS, TYPE II 03/31/2007  . DEPRESSION 03/31/2007  . HYPERTENSION 03/31/2007  . GERD 07/26/2009  . FATTY LIVER DISEASE 07/18/2008  . 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 HYPERCHOLESTEROLEMIA 10/31/2010  . Dyspepsia   . NASH (nonalcoholic steatohepatitis)   . HSV-1 infection   . ABNORMAL ELECTROCARDIOGRAM 07/26/2009    Past Surgical History  Procedure Date  . Stress cardiolite 11/22/2003    History   Social History  . Marital Status: Married    Spouse Name: N/A    Number of Children: N/A  . Years of Education: N/A   Occupational History  . Unemployed    Social History Main Topics  . Smoking status: Never Smoker   . Smokeless tobacco: Not on file  . Alcohol Use: Not on file  . Drug Use: Not on file  . Sexually Active: Not on file   Other Topics Concern  . Not on file   Social History Narrative  . No narrative on file    Current Outpatient Prescriptions on File Prior to Visit  Medication Sig Dispense Refill  . ACTOPLUS MET 15-500 MG per tablet TAKE 2 TABLETS BY MOUTH EVERY MORNING AND 1 TABLET EVERY EVENING  270 tablet  3  . aspirin 81 MG tablet Take 1 tablet (81 mg total) by mouth daily.  30 tablet  0  . atorvastatin (LIPITOR) 40 MG tablet TAKE 1 TABLET BY MOUTH DAILY.  90 tablet  1  . glimepiride (AMARYL) 2 MG tablet Take 1 tablet (2 mg total) by mouth daily before breakfast.  90 tablet  3  . JANUVIA 100 MG tablet TAKE 1  TABLET BY MOUTH DAILY  90 tablet  3  . Lancets (ONETOUCH ULTRASOFT) lancets TEST TWICE A DAY AS DIRECTED  100 each  5  . lisinopril (PRINIVIL,ZESTRIL) 5 MG tablet TAKE 1 TABLET (5 MG TOTAL) BY MOUTH DAILY.  90 tablet  2  . methocarbamol (ROBAXIN) 500 MG tablet TAKE 1 TABLET BY MOUTH AT BEDTIME FOR LEG CRAMPS  90 tablet  1  . NEXIUM 40 MG capsule TAKE 1 CAPSULE BY MOUTH DAILY  90 capsule  1  . ONE TOUCH ULTRA TEST test strip TEST TWICE A DAY AS DIRECTED  100 each  5  . triamcinolone (KENALOG) 0.025 % ointment Apply to affected area three times a day as needed for rash  30 g  0    No Known Allergies  Family History  Problem Relation Age of Onset  . Diabetes Mother   . Diabetes Brother   . Cancer Neg Hx    BP 110/62  Pulse 80  Temp 98.2 F (36.8 C) (Oral)  Ht 5\' 5"  (1.651 m)  Wt 151 lb (68.493 kg)  BMI 25.13 kg/m2  SpO2 96%  Review of Systems denies hypoglycemia and weight change    Objective:   Physical Exam Pulses: dorsalis pedis intact  bilat.   Feet: no deformity.  no ulcer on the feet.  feet are of normal color and temp.  no edema Neuro: sensation is intact to touch on the feet  Lab Results  Component Value Date   HGBA1C 7.4* 04/24/2012      Assessment & Plan:  DM: Needs increased rx, if it can be done with a regimen that avoids or minimizes hypoglycemia. At risk for gout, but no indication of this so far. Pt is immune to hepatitis-b, so he does not need vaccine

## 2012-04-24 NOTE — Patient Instructions (Addendum)
blood tests are being requested for you today.  You will receive a letter with results.   Please come back for a regular physical appointment in 4 months.   check your blood sugar once a day.  vary the time of day when you check, between before the 3 meals, and at bedtime.  also check if you have symptoms of your blood sugar being too high or too low.  please keep a record of the readings and bring it to your next appointment here.  please call us sooner if your blood sugar goes below 70, or if you have a lot of readings over 200. You should consider changing the glimepiride to invokana, starlix, or acarbose.

## 2012-04-25 ENCOUNTER — Encounter: Payer: Self-pay | Admitting: Endocrinology

## 2012-04-27 ENCOUNTER — Telehealth: Payer: Self-pay | Admitting: *Deleted

## 2012-04-27 NOTE — Telephone Encounter (Signed)
Called pt to inform of lab results, left message for pt to callback office (letter also mailed to pt). 

## 2012-04-27 NOTE — Telephone Encounter (Signed)
Pt's spouse informed of lab results. 

## 2012-05-06 ENCOUNTER — Telehealth: Payer: Self-pay | Admitting: *Deleted

## 2012-05-06 DIAGNOSIS — Z0389 Encounter for observation for other suspected diseases and conditions ruled out: Secondary | ICD-10-CM

## 2012-05-06 DIAGNOSIS — Z0000 Encounter for general adult medical examination without abnormal findings: Secondary | ICD-10-CM

## 2012-05-06 DIAGNOSIS — E119 Type 2 diabetes mellitus without complications: Secondary | ICD-10-CM

## 2012-05-06 NOTE — Telephone Encounter (Signed)
CPX labs placed into Epic for upcoming CPX appointment.

## 2012-05-06 NOTE — Telephone Encounter (Signed)
Message copied by Carin Primrose on Wed May 06, 2012  4:28 PM ------      Message from: Etheleen Sia      Created: Mon Apr 27, 2012 11:53 AM      Regarding: LAB       PHYSICAL LAB FOR DEC CPX / PT IS DIABETIC

## 2012-06-09 ENCOUNTER — Telehealth: Payer: Self-pay | Admitting: Endocrinology

## 2012-06-09 NOTE — Telephone Encounter (Signed)
Pt would like to switch medications but person who called wasn't sure which med he wanted to try. Please call back and advise.

## 2012-06-09 NOTE — Telephone Encounter (Signed)
Left msg for pt to call. 

## 2012-06-10 NOTE — Telephone Encounter (Signed)
LMOVM on 10/9

## 2012-06-11 MED ORDER — ACARBOSE 25 MG PO TABS: 25.0000 mg | ORAL_TABLET | Freq: Three times a day (TID) | ORAL | Status: DC

## 2012-06-11 NOTE — Telephone Encounter (Signed)
i sent rx This is a small dosage If it doesn't bother your stomach, call so we can increase it

## 2012-06-11 NOTE — Addendum Note (Signed)
Addended by: Romero Belling on: 06/11/2012 11:37 AM   Modules accepted: Orders

## 2012-06-11 NOTE — Telephone Encounter (Signed)
Pt wife called to state that wants to switch to the ACRABOSE instead of the GLIMEPERIDE. If this is ok please send into [facility] Call pt on [phone]

## 2012-06-11 NOTE — Telephone Encounter (Signed)
Pt wife notified.

## 2012-06-30 IMAGING — CR DG SHOULDER 2+V*L*
3 series · 3 of 3 positions shown · non-contrast
Comparison: None.

CLINICAL DATA: Shoulder pain

LEFT SHOULDER - 2+ VIEW

[view not recorded (1 of 3)]
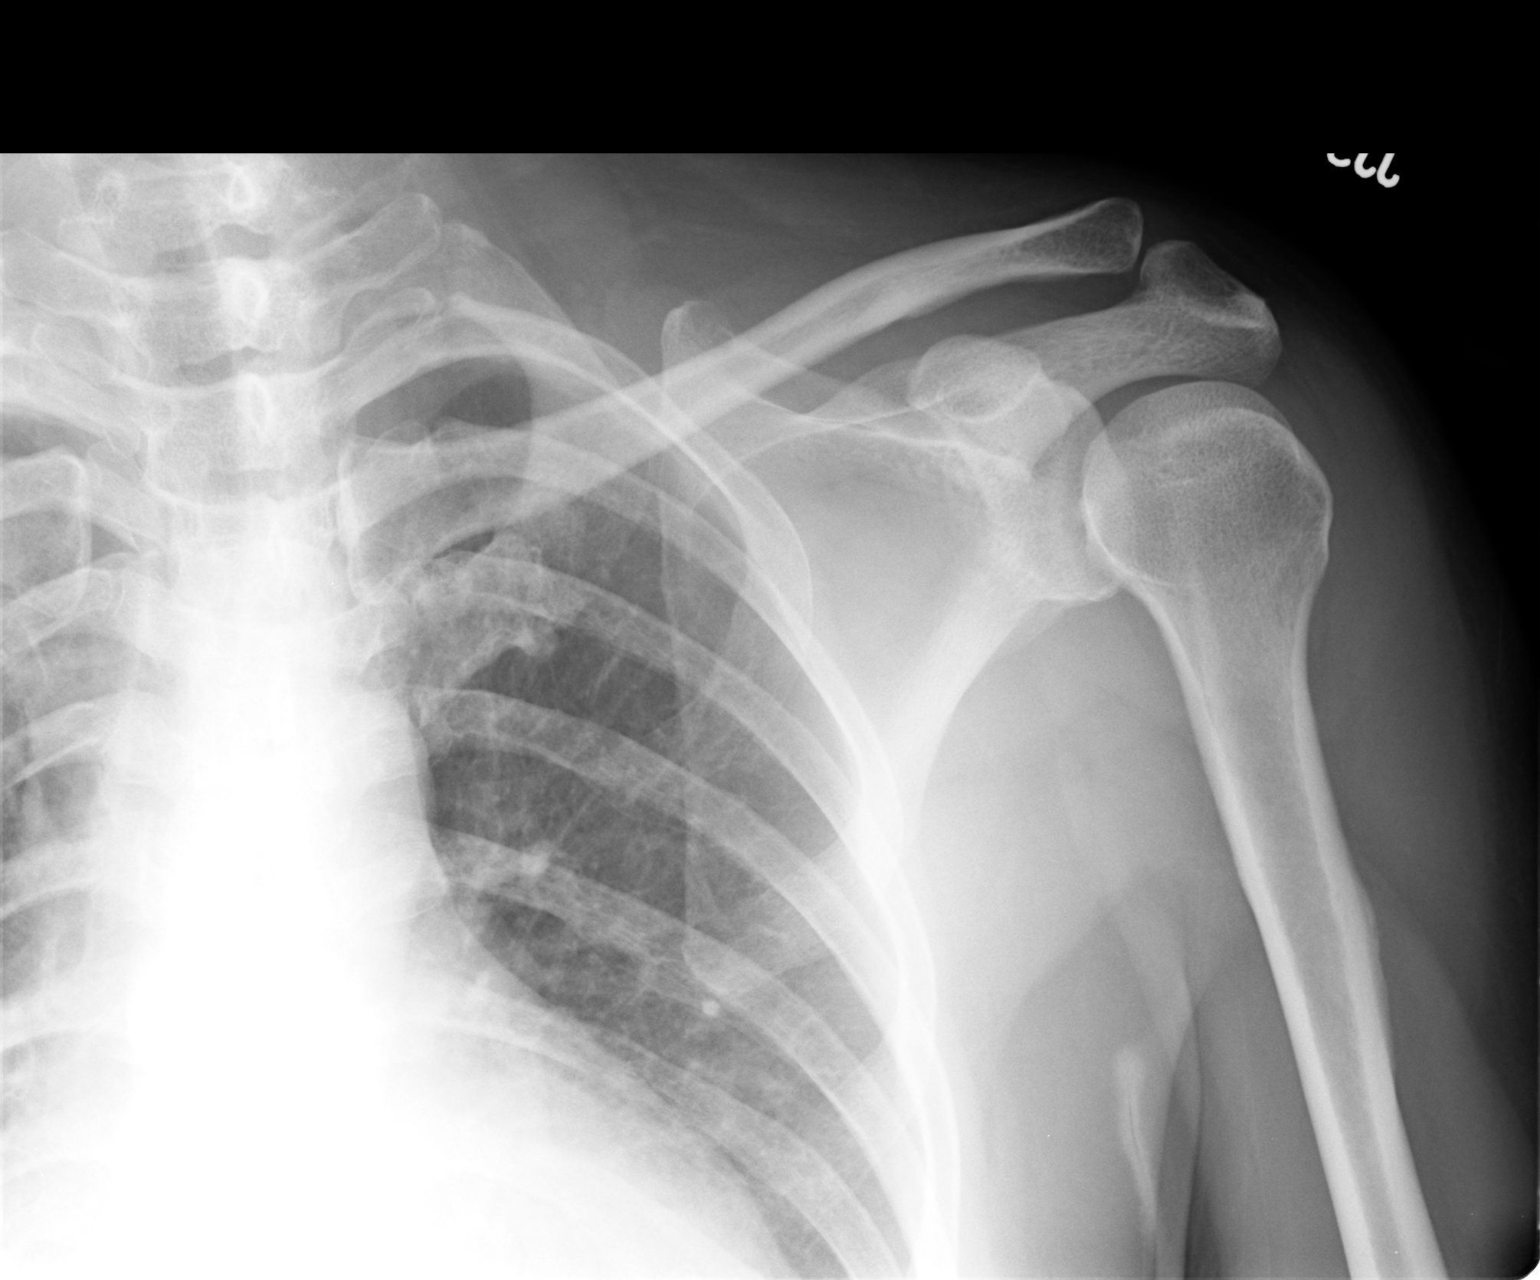

[view not recorded (2 of 3)]
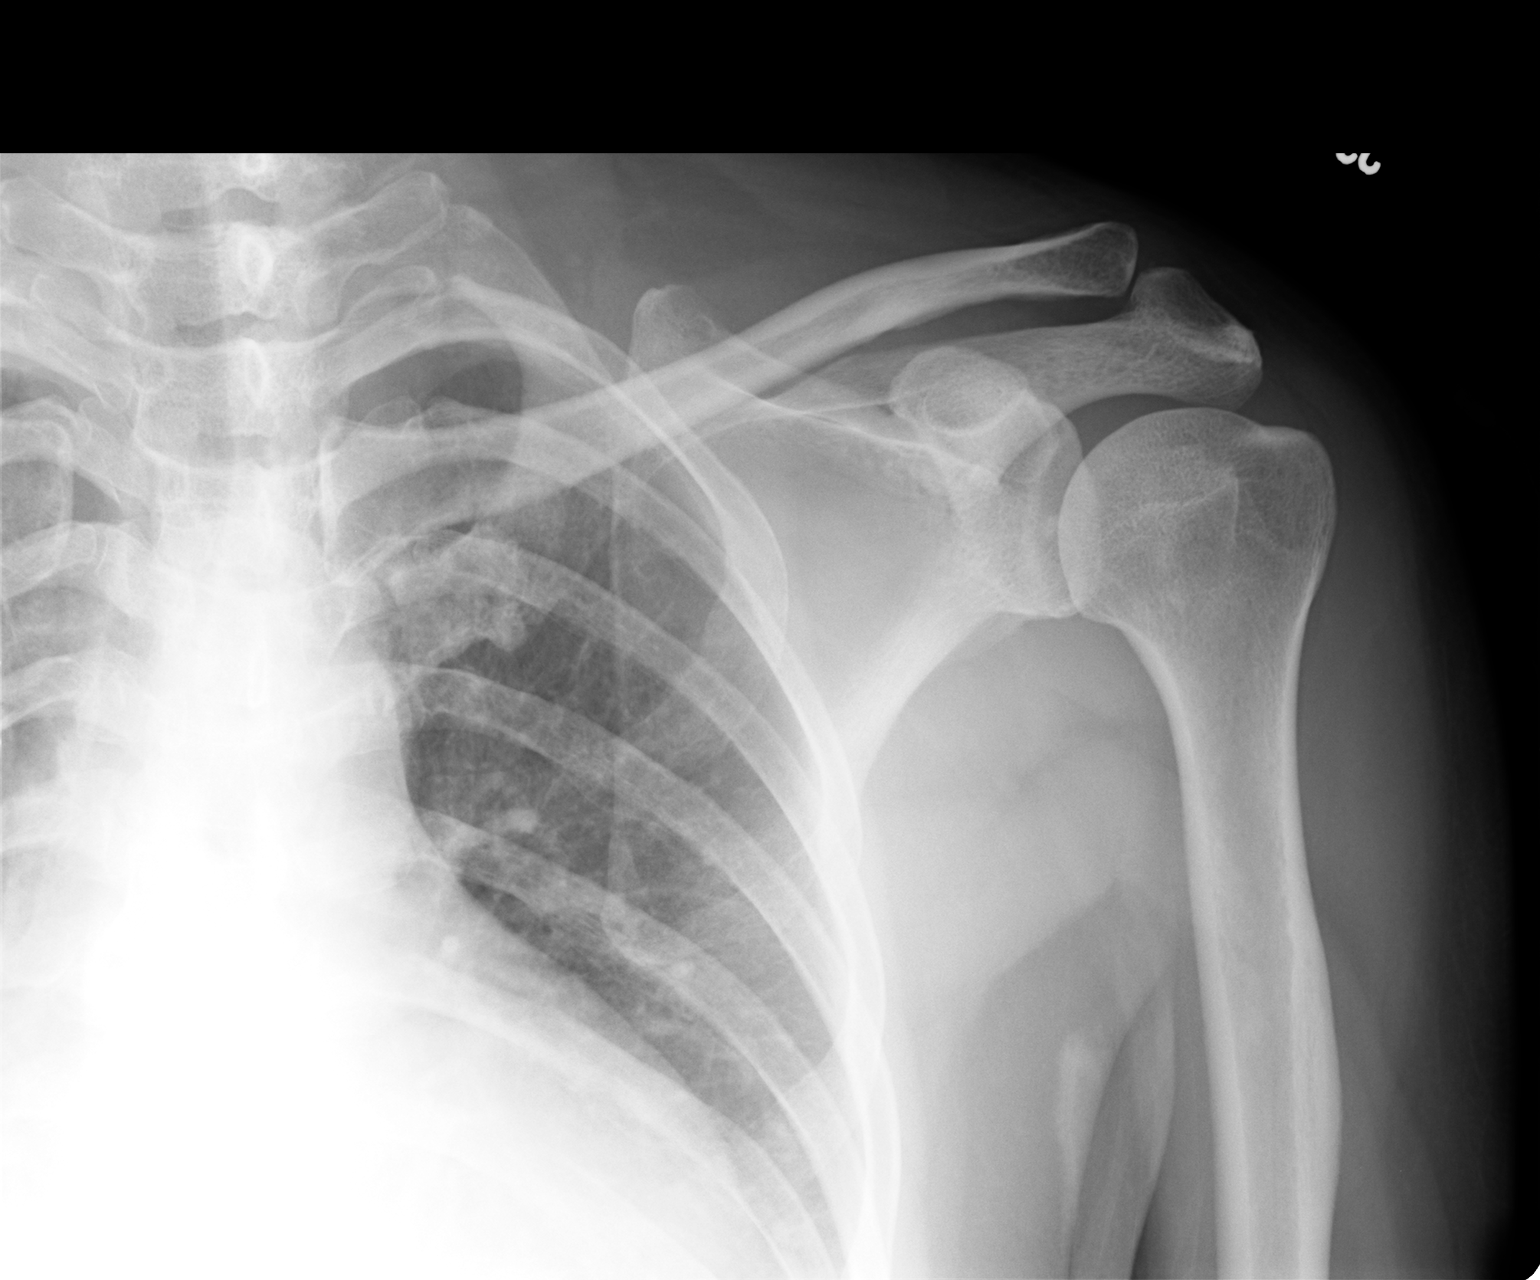

[view not recorded (3 of 3)]
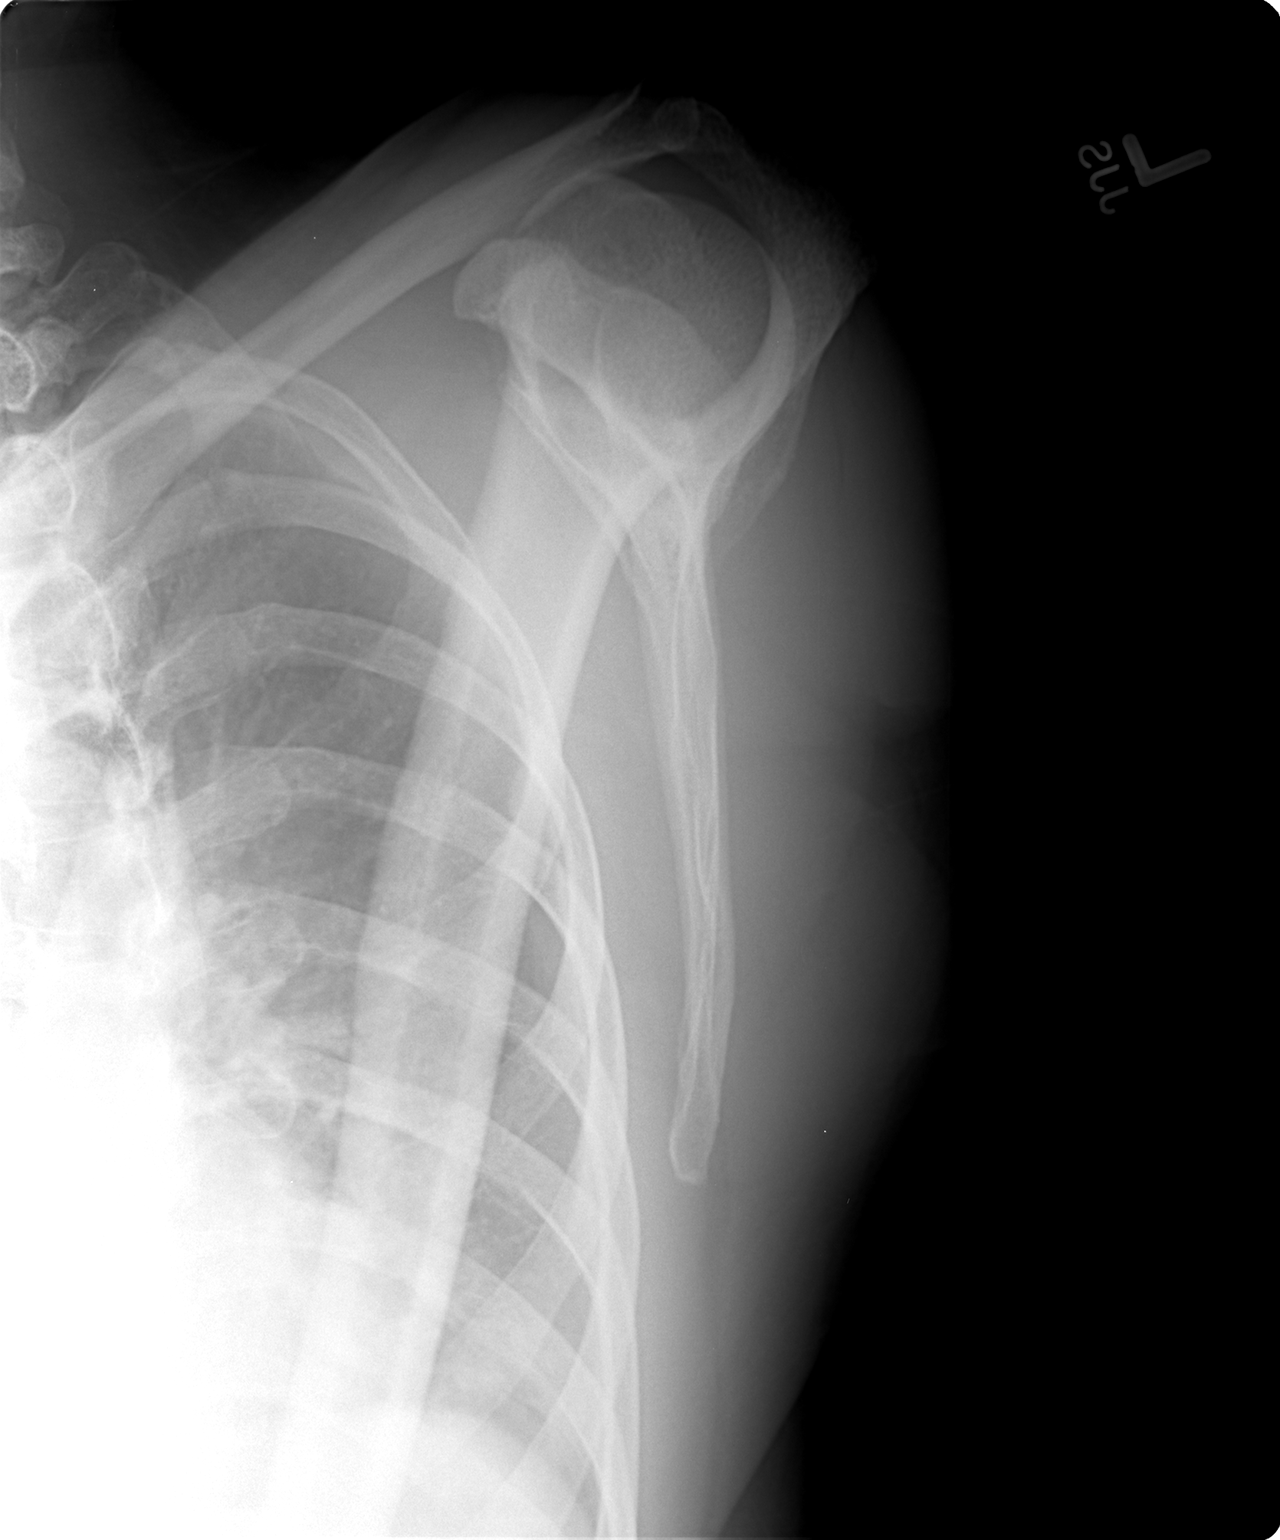

[3 of 3 positions shown; findings below may reference images not displayed]

FINDINGS: Normal alignment.  No fracture.  Preserved joint spaces.
IMPRESSION: No acute finding.

## 2012-08-06 ENCOUNTER — Ambulatory Visit: Payer: 59 | Admitting: Endocrinology

## 2012-08-07 ENCOUNTER — Other Ambulatory Visit: Payer: Self-pay | Admitting: Endocrinology

## 2012-08-14 ENCOUNTER — Other Ambulatory Visit (INDEPENDENT_AMBULATORY_CARE_PROVIDER_SITE_OTHER): Payer: 59

## 2012-08-14 ENCOUNTER — Encounter: Payer: Self-pay | Admitting: Endocrinology

## 2012-08-14 ENCOUNTER — Ambulatory Visit (INDEPENDENT_AMBULATORY_CARE_PROVIDER_SITE_OTHER): Payer: 59 | Admitting: Endocrinology

## 2012-08-14 VITALS — BP 118/80 | HR 74 | Temp 97.8°F | Wt 150.0 lb

## 2012-08-14 DIAGNOSIS — R9431 Abnormal electrocardiogram [ECG] [EKG]: Secondary | ICD-10-CM

## 2012-08-14 DIAGNOSIS — Z0000 Encounter for general adult medical examination without abnormal findings: Secondary | ICD-10-CM

## 2012-08-14 DIAGNOSIS — E119 Type 2 diabetes mellitus without complications: Secondary | ICD-10-CM

## 2012-08-14 DIAGNOSIS — Z0389 Encounter for observation for other suspected diseases and conditions ruled out: Secondary | ICD-10-CM

## 2012-08-14 LAB — CBC WITH DIFFERENTIAL/PLATELET
Basophils Relative: 0.3 % (ref 0.0–3.0)
Eosinophils Relative: 1.1 % (ref 0.0–5.0)
HCT: 46.6 % (ref 39.0–52.0)
Hemoglobin: 15.5 g/dL (ref 13.0–17.0)
Lymphs Abs: 2.3 10*3/uL (ref 0.7–4.0)
MCV: 90.3 fl (ref 78.0–100.0)
Monocytes Absolute: 0.3 10*3/uL (ref 0.1–1.0)
RBC: 5.16 Mil/uL (ref 4.22–5.81)
WBC: 6.5 10*3/uL (ref 4.5–10.5)

## 2012-08-14 LAB — PSA: PSA: 1.52 ng/mL (ref 0.10–4.00)

## 2012-08-14 LAB — URINALYSIS, ROUTINE W REFLEX MICROSCOPIC
Nitrite: NEGATIVE
Total Protein, Urine: NEGATIVE
Urine Glucose: >=1000
pH: 6 (ref 5.0–8.0)

## 2012-08-14 LAB — HEPATIC FUNCTION PANEL
AST: 28 U/L (ref 0–37)
Albumin: 4.6 g/dL (ref 3.5–5.2)
Total Protein: 7.8 g/dL (ref 6.0–8.3)

## 2012-08-14 LAB — LIPID PANEL
Cholesterol: 120 mg/dL (ref 0–200)
HDL: 37 mg/dL — ABNORMAL LOW (ref >39.00)
VLDL: 33.6 mg/dL (ref 0.0–40.0)

## 2012-08-14 LAB — BASIC METABOLIC PANEL
BUN: 15 mg/dL (ref 6–23)
CO2: 30 mEq/L (ref 19–32)
Calcium: 9.6 mg/dL (ref 8.4–10.5)
GFR: 81.72 mL/min (ref >60.00)
Glucose, Bld: 300 mg/dL — ABNORMAL HIGH (ref 70–99)

## 2012-08-14 LAB — HEMOGLOBIN A1C: Hgb A1c MFr Bld: 10.7 % — ABNORMAL HIGH (ref 4.6–6.5)

## 2012-08-14 LAB — MICROALBUMIN / CREATININE URINE RATIO
Creatinine,U: 105.8 mg/dL
Microalb Creat Ratio: 5.5 mg/g (ref 0.0–30.0)

## 2012-08-14 NOTE — Progress Notes (Signed)
Subjective:    Patient ID: Elijah Cervantes, male    DOB: [birth year], 56 y.o.   MRN: [id number]  HPI here for regular wellness examination.  He's feeling pretty well in general, and says chronic med probs are stable, except as noted below Past Medical History  Diagnosis Date  . DIABETES MELLITUS, TYPE II 03/31/2007  . DEPRESSION 03/31/2007  . HYPERTENSION 03/31/2007  . GERD 07/26/2009  . FATTY LIVER DISEASE 07/18/2008  . 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 HYPERCHOLESTEROLEMIA 10/31/2010  . Dyspepsia   . NASH (nonalcoholic steatohepatitis)   . HSV-1 infection   . ABNORMAL ELECTROCARDIOGRAM 07/26/2009    Past Surgical History  Procedure Date  . Stress cardiolite 11/22/2003    History   Social History  . Marital Status: Married    Spouse Name: N/A    Number of Children: N/A  . Years of Education: N/A   Occupational History  . Unemployed    Social History Main Topics  . Smoking status: Never Smoker   . Smokeless tobacco: Not on file  . Alcohol Use: Not on file  . Drug Use: Not on file  . Sexually Active: Not on file   Other Topics Concern  . Not on file   Social History Narrative  . No narrative on file    Current Outpatient Prescriptions on File Prior to Visit  Medication Sig Dispense Refill  . acarbose (PRECOSE) 25 MG tablet Take 1 tablet (25 mg total) by mouth 3 (three) times daily with meals.  90 tablet  11  . ACTOPLUS MET 15-500 MG per tablet TAKE 2 TABLETS BY MOUTH EVERY MORNING AND 1 TABLET EVERY EVENING  270 tablet  3  . aspirin 81 MG tablet Take 1 tablet (81 mg total) by mouth daily.  30 tablet  0  . atorvastatin (LIPITOR) 40 MG tablet TAKE 1 TABLET BY MOUTH DAILY.  90 tablet  1  . HYDROcodone-acetaminophen (NORCO) 10-325 MG per tablet 1/2-1 tablet every 4 hours as needed for pain  50 tablet  3  . JANUVIA 100 MG tablet TAKE 1 TABLET BY MOUTH DAILY  90 tablet  3  . Lancets (ONETOUCH ULTRASOFT) lancets TEST TWICE A DAY AS DIRECTED  100 each  5  .  lisinopril (PRINIVIL,ZESTRIL) 5 MG tablet TAKE 1 TABLET BY MOUTH DAILY.  90 tablet  0  . methocarbamol (ROBAXIN) 500 MG tablet TAKE 1 TABLET BY MOUTH AT BEDTIME FOR LEG CRAMPS  90 tablet  1  . NEXIUM 40 MG capsule TAKE 1 CAPSULE BY MOUTH DAILY  90 capsule  1  . ONE TOUCH ULTRA TEST test strip TEST TWICE A DAY AS DIRECTED  100 each  5  . triamcinolone (KENALOG) 0.025 % ointment Apply to affected area three times a day as needed for rash  30 g  0    No Known Allergies  Family History  Problem Relation Age of Onset  . Diabetes Mother   . Diabetes Brother   . Cancer Neg Hx     BP 118/80  Pulse 74  Temp 97.8 F (36.6 C) (Oral)  Wt 150 lb (68.04 kg)  SpO2 96%     Review of Systems  Constitutional: Negative for fever and unexpected weight change.  HENT: Negative for hearing loss.   Eyes: Negative for visual disturbance.  Respiratory: Negative for shortness of breath.   Cardiovascular: Negative for chest pain.  Gastrointestinal: Negative for anal bleeding.  Genitourinary: Negative for hematuria and difficulty urinating.  Musculoskeletal: Negative  for back pain.  Skin: Negative for rash.  Neurological: Negative for syncope.  Hematological: Does not bruise/bleed easily.  Psychiatric/Behavioral: Negative for dysphoric mood.       Objective:   Physical Exam VS: see vs page GEN: no distress HEAD: head: no deformity eyes: no periorbital swelling, no proptosis external nose and ears are normal mouth: no lesion seen NECK: supple, thyroid is not enlarged CHEST WALL: no deformity LUNGS: clear to auscultation BREASTS:  No gynecomastia CV: reg rate and rhythm, no murmur ABD: abdomen is soft, nontender.  no hepatosplenomegaly.  not distended.  no hernia  RECTAL/PROSTATE:  declined MUSCULOSKELETAL: muscle bulk and strength are grossly normal.  no obvious joint swelling.  gait is normal and steady PULSES: dorsalis pedis intact bilat.  no carotid bruit NEURO:  cn 2-12 grossly  intact.   readily moves all 4's.  sensation is intact to touch on the feet SKIN:  Normal texture and temperature.  No rash or suspicious lesion is visible.   NODES:  None palpable at the neck PSYCH: alert, oriented x3.  Does not appear anxious nor depressed.        Assessment & Plan:  Wellness visit today, with problems stable, except as noted.    SEPARATE EVALUATION FOLLOWS--EACH PROBLEM HERE IS NEW, NOT RESPONDING TO TREATMENT, OR POSES SIGNIFICANT RISK TO THE PATIENT'S HEALTH: HISTORY OF THE PRESENT ILLNESS: Pt returns for f/u of type 2 DM (dx'ed 1996; no known complications; he has declined parlodel and welchol).  He seldom checks cbg's, and he does not recall any of the readings PAST MEDICAL HISTORY reviewed and up to date today REVIEW OF SYSTEMS: Denies numbness PHYSICAL EXAMINATION: VITAL SIGNS:  See vs page GENERAL: no distress EXTEMITIES: no deformity.  no ulcer on the feet.  feet are of normal color and temp.  no edema LAB/XRAY RESULTS: Lab Results  Component Value Date   HGBA1C 10.7* 08/14/2012  IMPRESSION: DM: control is much worse PLAN: See instruction page

## 2012-08-14 NOTE — Patient Instructions (Addendum)
please consider these measures for your health:  minimize alcohol.  do not use tobacco products.  have a colonoscopy at least every 10 years from age 56.  keep firearms safely stored.  always use seat belts.  have working smoke alarms in your home.  see an eye doctor and dentist regularly.  never drive under the influence of alcohol or drugs (including prescription drugs).  Please come back for a follow-up appointment in 3 months.  blood tests are being requested for you today.  We'll contact you with results.  Based on the results, we'll probably need to increase the acarbose.  Call if you decide to do the colonoscopy.  This reduces your chances of dying of cancer.

## 2012-08-16 ENCOUNTER — Encounter: Payer: Self-pay | Admitting: Endocrinology

## 2012-08-20 ENCOUNTER — Encounter: Payer: Self-pay | Admitting: Endocrinology

## 2012-08-21 ENCOUNTER — Other Ambulatory Visit: Payer: Self-pay | Admitting: Endocrinology

## 2012-08-21 NOTE — Telephone Encounter (Signed)
It has been added per the last MyChart communication b/w Dr. Everardo All and the pt. Will approve the Rx.

## 2012-08-21 NOTE — Telephone Encounter (Signed)
Is this ok to fill, I did not see on pt's med list, that rx has been added?

## 2012-09-07 ENCOUNTER — Other Ambulatory Visit: Payer: Self-pay | Admitting: Endocrinology

## 2012-09-08 ENCOUNTER — Encounter: Payer: Self-pay | Admitting: Endocrinology

## 2012-09-09 ENCOUNTER — Ambulatory Visit (INDEPENDENT_AMBULATORY_CARE_PROVIDER_SITE_OTHER): Payer: 59

## 2012-09-09 DIAGNOSIS — Z23 Encounter for immunization: Secondary | ICD-10-CM

## 2012-09-14 ENCOUNTER — Other Ambulatory Visit: Payer: Self-pay

## 2012-09-14 MED ORDER — ATORVASTATIN CALCIUM 40 MG PO TABS: 40.0000 mg | ORAL_TABLET | Freq: Every day | ORAL | Status: DC

## 2012-10-30 ENCOUNTER — Encounter: Payer: Self-pay | Admitting: Endocrinology

## 2012-10-30 ENCOUNTER — Other Ambulatory Visit: Payer: Self-pay | Admitting: Endocrinology

## 2012-11-01 ENCOUNTER — Other Ambulatory Visit: Payer: Self-pay | Admitting: Endocrinology

## 2012-11-04 ENCOUNTER — Ambulatory Visit: Payer: 59

## 2012-11-04 LAB — HEMOGLOBIN A1C: Hgb A1c MFr Bld: 6.8 % — ABNORMAL HIGH (ref 4.6–6.5)

## 2012-11-13 ENCOUNTER — Encounter: Payer: Self-pay | Admitting: Endocrinology

## 2012-11-13 ENCOUNTER — Ambulatory Visit (INDEPENDENT_AMBULATORY_CARE_PROVIDER_SITE_OTHER): Payer: 59 | Admitting: Endocrinology

## 2012-11-13 VITALS — BP 126/74 | HR 78 | Wt 157.0 lb

## 2012-11-13 MED ORDER — GLIMEPIRIDE 1 MG PO TABS: 1.0000 mg | ORAL_TABLET | Freq: Every day | ORAL | Status: DC

## 2012-11-13 MED ORDER — PANTOPRAZOLE SODIUM 40 MG PO TBEC: 40.0000 mg | DELAYED_RELEASE_TABLET | Freq: Every day | ORAL | Status: DC

## 2012-11-13 NOTE — Patient Instructions (Addendum)
check your blood sugar once a day.  vary the time of day when you check, between before the 3 meals, and at bedtime.  also check if you have symptoms of your blood sugar being too high or too low.  please keep a record of the readings and bring it to your next appointment here.  please call us sooner if your blood sugar goes below 70, or if you have a lot of readings over 200. Please reduce the glimepiride to 1 mg daily. Please come back for a follow-up appointment in 3 months Refer for your colonoscopy.  you will receive a phone call, about a day and time for an appointment.  don't take the glimepiride the day before or the day of the procedure.  The day of the procedure, take your DM meds after the procedure.

## 2012-11-13 NOTE — Progress Notes (Signed)
Subjective:    Patient ID: Elijah Cervantes, male    DOB: 1956-05-14, 57 y.o.   MRN: [id number]  HPI Pt returns for f/u of type 2 DM (dx'ed 1996; no known complications; he has declined parlodel and welchol).  no cbg record, but states cbg's are well-controlled.  He says the improvement in his a1c is due to improved diet and exercise.  pt states he feels well in general. Past Medical History  Diagnosis Date  . DIABETES MELLITUS, TYPE II 03/31/2007  . DEPRESSION 03/31/2007  . HYPERTENSION 03/31/2007  . GERD 07/26/2009  . FATTY LIVER DISEASE 07/18/2008  . 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 HYPERCHOLESTEROLEMIA 10/31/2010  . Dyspepsia   . NASH (nonalcoholic steatohepatitis)   . HSV-1 infection   . ABNORMAL ELECTROCARDIOGRAM 07/26/2009    Past Surgical History  Procedure Laterality Date  . Stress cardiolite  11/22/2003    History   Social History  . Marital Status: Married    Spouse Name: N/A    Number of Children: N/A  . Years of Education: N/A   Occupational History  . Unemployed    Social History Main Topics  . Smoking status: Never Smoker   . Smokeless tobacco: Not on file  . Alcohol Use: Not on file  . Drug Use: Not on file  . Sexually Active: Not on file   Other Topics Concern  . Not on file   Social History Narrative  . No narrative on file    Current Outpatient Prescriptions on File Prior to Visit  Medication Sig Dispense Refill  . acarbose (PRECOSE) 25 MG tablet Take 1 tablet (25 mg total) by mouth 3 (three) times daily with meals.  90 tablet  11  . aspirin 81 MG tablet Take 1 tablet (81 mg total) by mouth daily.  30 tablet  0  . atorvastatin (LIPITOR) 40 MG tablet Take 1 tablet (40 mg total) by mouth daily.  90 tablet  3  . HYDROcodone-acetaminophen (NORCO) 10-325 MG per tablet 1/2-1 tablet every 4 hours as needed for pain  50 tablet  3  . JANUVIA 100 MG tablet TAKE 1 TABLET BY MOUTH DAILY  90 tablet  4  . Lancets (ONETOUCH ULTRASOFT) lancets TEST  TWICE A DAY AS DIRECTED  100 each  5  . lisinopril (PRINIVIL,ZESTRIL) 5 MG tablet TAKE 1 TABLET BY MOUTH DAILY.  90 tablet  0  . methocarbamol (ROBAXIN) 500 MG tablet TAKE 1 TABLET BY MOUTH AT BEDTIME FOR LEG CRAMPS  90 tablet  1  . ONE TOUCH ULTRA TEST test strip TEST TWICE A DAY AS DIRECTED  100 each  5  . pioglitazone-metformin (ACTOPLUS MET) 15-500 MG per tablet TAKE 2 TABLETS BY MOUTH EVERY MORNING AND 1 TABLET EVERY EVENING  270 tablet  4  . triamcinolone (KENALOG) 0.025 % ointment Apply to affected area three times a day as needed for rash  30 g  0   No current facility-administered medications on file prior to visit.    No Known Allergies  Family History  Problem Relation Age of Onset  . Diabetes Mother   . Diabetes Brother   . Cancer Neg Hx     BP 126/74  Pulse 78  Wt 157 lb (71.215 kg)  BMI 26.13 kg/m2  SpO2 98%  Review of Systems denies hypoglycemia    Objective:   Physical Exam VITAL SIGNS:  See vs page GENERAL: no distress PSYCH: Alert and oriented x 3.  Does not appear anxious nor  depressed.  Lab Results  Component Value Date   HGBA1C 6.8* 11/04/2012      Assessment & Plan:  DM is overcontrolled, for this sulfonylurea-containing regimen.

## 2012-11-24 ENCOUNTER — Encounter: Payer: Self-pay | Admitting: Internal Medicine

## 2013-01-15 ENCOUNTER — Ambulatory Visit (AMBULATORY_SURGERY_CENTER): Payer: 59 | Admitting: *Deleted

## 2013-01-15 VITALS — Ht 67.0 in | Wt 151.0 lb

## 2013-01-15 DIAGNOSIS — Z1211 Encounter for screening for malignant neoplasm of colon: Secondary | ICD-10-CM

## 2013-01-15 MED ORDER — NA SULFATE-K SULFATE-MG SULF 17.5-3.13-1.6 GM/177ML PO SOLN: ORAL | Status: DC

## 2013-01-15 NOTE — Progress Notes (Signed)
No egg or soy allergy Pt registered in [facility] and info sheet given

## 2013-02-05 ENCOUNTER — Ambulatory Visit (AMBULATORY_SURGERY_CENTER): Payer: 59 | Admitting: Internal Medicine

## 2013-02-05 ENCOUNTER — Encounter: Payer: Self-pay | Admitting: Internal Medicine

## 2013-02-05 VITALS — BP 144/80 | HR 73 | Temp 97.1°F | Resp 15 | Ht 67.0 in | Wt 157.0 lb

## 2013-02-05 DIAGNOSIS — D126 Benign neoplasm of colon, unspecified: Secondary | ICD-10-CM

## 2013-02-05 DIAGNOSIS — Z1211 Encounter for screening for malignant neoplasm of colon: Secondary | ICD-10-CM

## 2013-02-05 MED ORDER — SODIUM CHLORIDE 0.9 % IV SOLN: 500.0000 mL | INTRAVENOUS | Status: DC

## 2013-02-05 NOTE — Progress Notes (Signed)
Procedure ends, to recovery awake, report given and VSS. 

## 2013-02-05 NOTE — Progress Notes (Signed)
Patient did not experience any of the following events: a burn prior to discharge; a fall within the facility; wrong site/side/patient/procedure/implant event; or a hospital transfer or hospital admission upon discharge from the facility. (G8907) Patient did not have preoperative order for IV antibiotic SSI prophylaxis. (G8918)  

## 2013-02-05 NOTE — Patient Instructions (Addendum)
I found and removed 3 tiny polyps that should be benign. They might mean you need a repeat colonoscopy in 5 years but still could be 10 years as I need the pathologist to tell me if they are pre-cancerous or not.  I will let you know pathology results and when to have another routine colonoscopy by mail.  I appreciate the opportunity to care for you. Iva Boop, MD, FACG  YOU HAD AN ENDOSCOPIC PROCEDURE TODAY AT THE [facility]: Refer to the procedure report that was given to you for any specific questions about what was found during the examination.  If the procedure report does not answer your questions, please call your gastroenterologist to clarify.  If you requested that your care partner not be given the details of your procedure findings, then the procedure report has been included in a sealed envelope for you to review at your convenience later.  YOU SHOULD EXPECT: Some feelings of bloating in the abdomen. Passage of more gas than usual.  Walking can help get rid of the air that was put into your GI tract during the procedure and reduce the bloating. If you had a lower endoscopy (such as a colonoscopy or flexible sigmoidoscopy) you may notice spotting of blood in your stool or on the toilet paper. If you underwent a bowel prep for your procedure, then you may not have a normal bowel movement for a few days.  DIET: Your first meal following the procedure should be a light meal and then it is ok to progress to your normal diet.  A half-sandwich or bowl of soup is an example of a good first meal.  Heavy or fried foods are harder to digest and may make you feel nauseous or bloated.  Likewise meals heavy in dairy and vegetables can cause extra gas to form and this can also increase the bloating.  Drink plenty of fluids but you should avoid alcoholic beverages for 24 hours.  ACTIVITY: Your care partner should take you home directly after the procedure.  You should plan to take it  easy, moving slowly for the rest of the day.  You can resume normal activity the day after the procedure however you should NOT DRIVE or use heavy machinery for 24 hours (because of the sedation medicines used during the test).    SYMPTOMS TO REPORT IMMEDIATELY: A gastroenterologist can be reached at any hour.  During normal business hours, 8:30 AM to 5:00 PM Monday through Friday, call [phone].  After hours and on weekends, please call the GI answering service at [phone] who will take a message and have the physician on call contact you.   Following lower endoscopy (colonoscopy or flexible sigmoidoscopy):  Excessive amounts of blood in the stool  Significant tenderness or worsening of abdominal pains  Swelling of the abdomen that is new, acute  Fever of 100F or higher  FOLLOW UP: If any biopsies were taken you will be contacted by phone or by letter within the next 1-3 weeks.  Call your gastroenterologist if you have not heard about the biopsies in 3 weeks.  Our staff will call the home number listed on your records the next business day following your procedure to check on you and address any questions or concerns that you may have at that time regarding the information given to you following your procedure. This is a courtesy call and so if there is no answer at the home number and we have  heard from you through the emergency physician on call, we will assume that you have returned to your regular daily activities without incident.  SIGNATURES/CONFIDENTIALITY: You and/or your care partner have signed paperwork which will be entered into your electronic medical record.  These signatures attest to the fact that that the information above on your After Visit Summary has been reviewed and is understood.  Full responsibility of the confidentiality of this discharge information lies with you and/or your care-partner. 

## 2013-02-05 NOTE — Op Note (Signed)
[facility] [address] [place], [address]   COLONOSCOPY PROCEDURE REPORT  PATIENT: Elijah Cervantes, Elijah Cervantes  MR#: [id number] BIRTHDATE: 1955/12/17 , 56  yrs. old GENDER: Male ENDOSCOPIST: Iva Boop, MD, [facility] REFERRED OZ:HYQM Ardeen Garland, M.D. PROCEDURE DATE:  02/05/2013 PROCEDURE:   Colonoscopy with biopsy ASA CLASS:   Class II INDICATIONS:average risk screening and first colonoscopy. MEDICATIONS: propofol (Diprivan) 200mg  IV, MAC sedation, administered by CRNA, and These medications were titrated to patient response per physician's verbal order  DESCRIPTION OF PROCEDURE:   After the risks benefits and alternatives of the procedure were thoroughly explained, informed consent was obtained.  A digital rectal exam revealed no abnormalities of the rectum, A digital rectal exam revealed no prostatic nodules, and A digital rectal exam revealed the prostate was not enlarged.   The LB [id number]  endoscope was introduced through the anus and advanced to the cecum, which was identified by both the appendix and ileocecal valve. No adverse events experienced.   The quality of the prep was excellent using Suprep  The instrument was then slowly withdrawn as the colon was fully examined.      COLON FINDINGS: Three polyps measuring 2-3 mm in size were found in the transverse colon (1) and at the splenic flexure (2).  A polypectomy was performed with cold forceps.  The resection was complete and the polyp tissue was completely retrieved.   The colon mucosa was otherwise normal.   A right colon retroflexion was performed.  Retroflexed views revealed no abnormalities. The time to cecum=1 minutes 15 seconds.  Withdrawal time=13 minutes 47 seconds.  The scope was withdrawn and the procedure completed. COMPLICATIONS: There were no complications.  ENDOSCOPIC IMPRESSION: 1.   Three polyps measuring 2-3 mm in size were found in the transverse colon and at the splenic  flexure; polypectomy was performed with cold forceps 2.   The colon mucosa was otherwise normal - excellent prep  RECOMMENDATIONS: Timing of repeat colonoscopy will be determined by pathology findings in patient w/ first screening colonoscopy   eSigned:  Iva Boop, MD, [facility] 02/05/2013 8:30 AM  cc: Minus Breeding, MD and The Patient

## 2013-02-05 NOTE — Progress Notes (Signed)
Called to room to assist during endoscopic procedure.  Patient ID and intended procedure confirmed with present staff. Received instructions for my participation in the procedure from the performing physician.  

## 2013-02-08 ENCOUNTER — Telehealth: Payer: Self-pay

## 2013-02-08 NOTE — Telephone Encounter (Signed)
Left message on machine.

## 2013-02-09 ENCOUNTER — Other Ambulatory Visit: Payer: Self-pay | Admitting: Endocrinology

## 2013-02-10 ENCOUNTER — Encounter: Payer: Self-pay | Admitting: Internal Medicine

## 2013-02-10 DIAGNOSIS — Z860100 Personal history of colon polyps, unspecified: Secondary | ICD-10-CM | POA: Insufficient documentation

## 2013-02-10 HISTORY — DX: Personal history of colonic polyps: Z86.010

## 2013-02-10 HISTORY — DX: Personal history of colon polyps, unspecified: Z86.0100

## 2013-02-10 NOTE — Progress Notes (Signed)
Quick Note:  Diminutive adenoma and benign micosa Repeat colon about 01/2018 ______

## 2013-02-11 ENCOUNTER — Other Ambulatory Visit: Payer: Self-pay | Admitting: Endocrinology

## 2013-02-11 ENCOUNTER — Encounter: Payer: Self-pay | Admitting: Endocrinology

## 2013-02-11 DIAGNOSIS — E119 Type 2 diabetes mellitus without complications: Secondary | ICD-10-CM

## 2013-02-16 ENCOUNTER — Ambulatory Visit: Payer: 59

## 2013-02-16 DIAGNOSIS — E119 Type 2 diabetes mellitus without complications: Secondary | ICD-10-CM

## 2013-02-16 LAB — HEMOGLOBIN A1C: Hgb A1c MFr Bld: 7.8 % — ABNORMAL HIGH (ref 4.6–6.5)

## 2013-02-19 ENCOUNTER — Ambulatory Visit (INDEPENDENT_AMBULATORY_CARE_PROVIDER_SITE_OTHER): Payer: 59 | Admitting: Endocrinology

## 2013-02-19 ENCOUNTER — Encounter: Payer: Self-pay | Admitting: Endocrinology

## 2013-02-19 VITALS — BP 134/70 | HR 80 | Ht 67.0 in | Wt 156.0 lb

## 2013-02-19 DIAGNOSIS — E119 Type 2 diabetes mellitus without complications: Secondary | ICD-10-CM

## 2013-02-19 MED ORDER — CANAGLIFLOZIN 100 MG PO TABS: 1.0000 | ORAL_TABLET | Freq: Every day | ORAL | Status: DC

## 2013-02-19 NOTE — Patient Instructions (Addendum)
check your blood sugar once a day.  vary the time of day when you check, between before the 3 meals, and at bedtime.  also check if you have symptoms of your blood sugar being too high or too low.  please keep a record of the readings and bring it to your next appointment here.  please call us sooner if your blood sugar goes below 70, or if you have a lot of readings over 200. i have sent a prescription to your pharmacy, to add "invokana."  Drink plenty of fluids while on this.   Please come back for a follow-up appointment in 3 months.

## 2013-02-19 NOTE — Progress Notes (Signed)
Subjective:    Patient ID: Elijah Cervantes, male    DOB: 24-Aug-1956, 57 y.o.   MRN: [id number]  HPI Pt returns for f/u of type 2 DM (dx'ed 1996; he has mild if any neuropathy of the lower extremities; no known associated complications; he has declined parlodel, acarbose, and welchol).  no cbg record, but states cbg's are in the mid-100's.  pt states he feels well in general. Past Medical History  Diagnosis Date  . DIABETES MELLITUS, TYPE II 03/31/2007  . DEPRESSION 03/31/2007  . HYPERTENSION 03/31/2007  . GERD 07/26/2009  . FATTY LIVER DISEASE 07/18/2008  . 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 HYPERCHOLESTEROLEMIA 10/31/2010  . Dyspepsia   . NASH (nonalcoholic steatohepatitis)   . HSV-1 infection   . ABNORMAL ELECTROCARDIOGRAM 07/26/2009  . Personal history of colonic adenoma 02/10/2013    Past Surgical History  Procedure Laterality Date  . Stress cardiolite  11/22/2003  . Hernia repair      History   Social History  . Marital Status: Married    Spouse Name: N/A    Number of Children: N/A  . Years of Education: N/A   Occupational History  . Unemployed    Social History Main Topics  . Smoking status: Never Smoker   . Smokeless tobacco: Never Used  . Alcohol Use: Yes     Comment: occasional use  . Drug Use: No  . Sexually Active: Not on file   Other Topics Concern  . Not on file   Social History Narrative  . No narrative on file    Current Outpatient Prescriptions on File Prior to Visit  Medication Sig Dispense Refill  . aspirin 81 MG tablet Take 1 tablet (81 mg total) by mouth daily.  30 tablet  0  . atorvastatin (LIPITOR) 40 MG tablet Take 1 tablet (40 mg total) by mouth daily.  90 tablet  3  . glimepiride (AMARYL) 1 MG tablet Take 1 tablet (1 mg total) by mouth daily before breakfast.  90 tablet  3  . HYDROcodone-acetaminophen (NORCO) 10-325 MG per tablet 1/2-1 tablet every 4 hours as needed for pain  50 tablet  3  . JANUVIA 100 MG tablet TAKE 1 TABLET BY  MOUTH DAILY  90 tablet  4  . Lancets (ONETOUCH ULTRASOFT) lancets TEST TWICE A DAY AS DIRECTED  100 each  5  . lisinopril (PRINIVIL,ZESTRIL) 5 MG tablet TAKE 1 TABLET BY MOUTH DAILY.  90 tablet  0  . methocarbamol (ROBAXIN) 500 MG tablet TAKE 1 TABLET BY MOUTH AT BEDTIME FOR LEG CRAMPS  90 tablet  1  . ONE TOUCH ULTRA TEST test strip TEST TWICE A DAY AS DIRECTED  100 each  5  . pantoprazole (PROTONIX) 40 MG tablet Take 1 tablet (40 mg total) by mouth daily.  90 tablet  3  . pioglitazone-metformin (ACTOPLUS MET) 15-500 MG per tablet TAKE 2 TABLETS BY MOUTH EVERY MORNING AND 1 TABLET EVERY EVENING  270 tablet  4  . triamcinolone (KENALOG) 0.025 % ointment Apply to affected area three times a day as needed for rash  30 g  0   No current facility-administered medications on file prior to visit.    No Known Allergies  Family History  Problem Relation Age of Onset  . Diabetes Mother   . Diabetes Brother   . Cancer Neg Hx   . Colon cancer Neg Hx   . Esophageal cancer Neg Hx   . Stomach cancer Neg Hx   .  Rectal cancer Neg Hx     BP 134/70  Pulse 80  Ht 5\' 7"  (1.702 m)  Wt 156 lb (70.761 kg)  BMI 24.43 kg/m2  SpO2 98%  Review of Systems denies hypoglycemia and weight change.    Objective:   Physical Exam VITAL SIGNS:  See vs page GENERAL: no distress   Lab Results  Component Value Date   HGBA1C 7.8* 02/16/2013      Assessment & Plan:  We discussed the nine oral agents available for type 2 diabetes.  i advised adding invokana, and trying tp phase out the amaryl.  He declines.

## 2013-05-10 ENCOUNTER — Other Ambulatory Visit: Payer: Self-pay | Admitting: Endocrinology

## 2013-05-17 ENCOUNTER — Encounter: Payer: Self-pay | Admitting: Endocrinology

## 2013-05-17 ENCOUNTER — Telehealth: Payer: Self-pay

## 2013-05-17 DIAGNOSIS — E119 Type 2 diabetes mellitus without complications: Secondary | ICD-10-CM

## 2013-05-17 NOTE — Telephone Encounter (Signed)
Pt would like to know if he needs to get labs prior to his appt on 9/19? [phone]

## 2013-05-17 NOTE — Telephone Encounter (Signed)
Pt advised.

## 2013-05-17 NOTE — Telephone Encounter (Signed)
i ordered

## 2013-05-18 ENCOUNTER — Other Ambulatory Visit (INDEPENDENT_AMBULATORY_CARE_PROVIDER_SITE_OTHER): Payer: 59

## 2013-05-18 DIAGNOSIS — E119 Type 2 diabetes mellitus without complications: Secondary | ICD-10-CM

## 2013-05-21 ENCOUNTER — Ambulatory Visit (INDEPENDENT_AMBULATORY_CARE_PROVIDER_SITE_OTHER): Payer: 59 | Admitting: Endocrinology

## 2013-05-21 ENCOUNTER — Other Ambulatory Visit (INDEPENDENT_AMBULATORY_CARE_PROVIDER_SITE_OTHER): Payer: 59 | Admitting: *Deleted

## 2013-05-21 ENCOUNTER — Encounter: Payer: Self-pay | Admitting: Endocrinology

## 2013-05-21 VITALS — BP 122/70 | HR 80 | Wt 149.0 lb

## 2013-05-21 DIAGNOSIS — E119 Type 2 diabetes mellitus without complications: Secondary | ICD-10-CM

## 2013-05-21 DIAGNOSIS — Z23 Encounter for immunization: Secondary | ICD-10-CM

## 2013-05-21 MED ORDER — CANAGLIFLOZIN 100 MG PO TABS: 1.0000 | ORAL_TABLET | Freq: Every day | ORAL | Status: DC

## 2013-05-21 MED ORDER — NATEGLINIDE 120 MG PO TABS: 120.0000 mg | ORAL_TABLET | Freq: Three times a day (TID) | ORAL | Status: DC

## 2013-05-21 NOTE — Progress Notes (Signed)
Subjective:    Patient ID: Elijah Cervantes, male    DOB: [birth year], 57 y.o.   MRN: [id number]  HPI Pt returns for f/u of type 2 DM (dx'ed 1996; he has mild if any neuropathy of the lower extremities; no known associated complications; he has declined parlodel, acarbose, and welchol).  no cbg record, but states cbg's are in the mid-100's.  pt states he feels well in general.   Past Medical History  Diagnosis Date  . DIABETES MELLITUS, TYPE II 03/31/2007  . DEPRESSION 03/31/2007  . HYPERTENSION 03/31/2007  . GERD 07/26/2009  . FATTY LIVER DISEASE 07/18/2008  . 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 HYPERCHOLESTEROLEMIA 10/31/2010  . Dyspepsia   . NASH (nonalcoholic steatohepatitis)   . HSV-1 infection   . ABNORMAL ELECTROCARDIOGRAM 07/26/2009  . Personal history of colonic adenoma 02/10/2013    Past Surgical History  Procedure Laterality Date  . Stress cardiolite  11/22/2003  . Hernia repair      History   Social History  . Marital Status: Married    Spouse Name: N/A    Number of Children: N/A  . Years of Education: N/A   Occupational History  . Unemployed    Social History Main Topics  . Smoking status: Never Smoker   . Smokeless tobacco: Never Used  . Alcohol Use: Yes     Comment: occasional use  . Drug Use: No  . Sexual Activity: Not on file   Other Topics Concern  . Not on file   Social History Narrative  . No narrative on file    Current Outpatient Prescriptions on File Prior to Visit  Medication Sig Dispense Refill  . aspirin 81 MG tablet Take 1 tablet (81 mg total) by mouth daily.  30 tablet  0  . atorvastatin (LIPITOR) 40 MG tablet Take 1 tablet (40 mg total) by mouth daily.  90 tablet  3  . Canagliflozin (INVOKANA) 100 MG TABS Take 1 tablet (100 mg total) by mouth daily.  30 tablet  11  . HYDROcodone-acetaminophen (NORCO) 10-325 MG per tablet 1/2-1 tablet every 4 hours as needed for pain  50 tablet  3  . JANUVIA 100 MG tablet TAKE 1 TABLET BY MOUTH  DAILY  90 tablet  4  . Lancets (ONETOUCH ULTRASOFT) lancets TEST TWICE A DAY AS DIRECTED  100 each  5  . lisinopril (PRINIVIL,ZESTRIL) 5 MG tablet TAKE 1 TABLET BY MOUTH ONCE DAILY  90 tablet  0  . methocarbamol (ROBAXIN) 500 MG tablet TAKE 1 TABLET BY MOUTH AT BEDTIME FOR LEG CRAMPS  90 tablet  1  . ONE TOUCH ULTRA TEST test strip TEST TWICE A DAY AS DIRECTED  100 each  5  . pantoprazole (PROTONIX) 40 MG tablet Take 1 tablet (40 mg total) by mouth daily.  90 tablet  3  . pioglitazone-metformin (ACTOPLUS MET) 15-500 MG per tablet TAKE 2 TABLETS BY MOUTH EVERY MORNING AND 1 TABLET EVERY EVENING  270 tablet  4  . triamcinolone (KENALOG) 0.025 % ointment Apply to affected area three times a day as needed for rash  30 g  0   No current facility-administered medications on file prior to visit.    No Known Allergies  Family History  Problem Relation Age of Onset  . Diabetes Mother   . Diabetes Brother   . Cancer Neg Hx   . Colon cancer Neg Hx   . Esophageal cancer Neg Hx   . Stomach cancer Neg Hx   .  Rectal cancer Neg Hx     BP 122/70  Pulse 80  Wt 149 lb (67.586 kg)  BMI 23.33 kg/m2  SpO2 98%   Review of Systems denies hypoglycemia. He has lost weight, due to his efforts.      Objective:   Physical Exam VITAL SIGNS:  See vs page GENERAL: no distress  Lab Results  Component Value Date   HGBA1C 7.5* 05/18/2013      Assessment & Plan:  DM: Needs increased rx, if it can be done with a regimen that avoids or minimizes hypoglycemia. Weight loss. This helps glycemic control.   NASH: actos helps this and DM.

## 2013-05-21 NOTE — Patient Instructions (Addendum)
check your blood sugar once a day.  vary the time of day when you check, between before the 3 meals, and at bedtime.  also check if you have symptoms of your blood sugar being too high or too low.  please keep a record of the readings and bring it to your next appointment here.  please call us sooner if your blood sugar goes below 70, or if you have a lot of readings over 200. i have sent a prescription to your pharmacy, to change glimepiride to "nateglinide."   Please come back for a regular physical appointment in 4 months.

## 2013-06-03 ENCOUNTER — Other Ambulatory Visit: Payer: Self-pay | Admitting: Endocrinology

## 2013-06-03 ENCOUNTER — Other Ambulatory Visit: Payer: Self-pay | Admitting: *Deleted

## 2013-06-03 MED ORDER — GLUCOSE BLOOD VI STRP: ORAL_STRIP | Status: DC

## 2013-08-09 ENCOUNTER — Encounter: Payer: Self-pay | Admitting: Endocrinology

## 2013-08-09 LAB — HM DIABETES EYE EXAM

## 2013-08-11 ENCOUNTER — Other Ambulatory Visit: Payer: Self-pay | Admitting: Endocrinology

## 2013-08-16 ENCOUNTER — Other Ambulatory Visit: Payer: Self-pay | Admitting: Endocrinology

## 2013-09-15 ENCOUNTER — Encounter: Payer: Self-pay | Admitting: Endocrinology

## 2013-09-15 ENCOUNTER — Other Ambulatory Visit: Payer: Self-pay | Admitting: Endocrinology

## 2013-09-15 DIAGNOSIS — Z0000 Encounter for general adult medical examination without abnormal findings: Secondary | ICD-10-CM

## 2013-09-15 DIAGNOSIS — E119 Type 2 diabetes mellitus without complications: Secondary | ICD-10-CM

## 2013-09-15 DIAGNOSIS — Z125 Encounter for screening for malignant neoplasm of prostate: Secondary | ICD-10-CM

## 2013-09-16 ENCOUNTER — Other Ambulatory Visit (INDEPENDENT_AMBULATORY_CARE_PROVIDER_SITE_OTHER): Payer: 59

## 2013-09-16 DIAGNOSIS — Z0000 Encounter for general adult medical examination without abnormal findings: Secondary | ICD-10-CM

## 2013-09-16 DIAGNOSIS — Z125 Encounter for screening for malignant neoplasm of prostate: Secondary | ICD-10-CM

## 2013-09-16 DIAGNOSIS — E119 Type 2 diabetes mellitus without complications: Secondary | ICD-10-CM

## 2013-09-16 LAB — CBC WITH DIFFERENTIAL/PLATELET
BASOS ABS: 0.1 10*3/uL (ref 0.0–0.1)
Basophils Relative: 0.9 % (ref 0.0–3.0)
Eosinophils Absolute: 0.1 10*3/uL (ref 0.0–0.7)
Eosinophils Relative: 1.6 % (ref 0.0–5.0)
HCT: 47 % (ref 39.0–52.0)
Hemoglobin: 15.4 g/dL (ref 13.0–17.0)
LYMPHS PCT: 42.4 % (ref 12.0–46.0)
Lymphs Abs: 2.9 10*3/uL (ref 0.7–4.0)
MCHC: 32.8 g/dL (ref 30.0–36.0)
MCV: 92.8 fl (ref 78.0–100.0)
MONO ABS: 0.4 10*3/uL (ref 0.1–1.0)
Monocytes Relative: 5.5 % (ref 3.0–12.0)
NEUTROS ABS: 3.4 10*3/uL (ref 1.4–7.7)
NEUTROS PCT: 49.6 % (ref 43.0–77.0)
Platelets: 208 10*3/uL (ref 150.0–400.0)
RBC: 5.07 Mil/uL (ref 4.22–5.81)
RDW: 13.6 % (ref 11.5–14.6)
WBC: 6.9 10*3/uL (ref 4.5–10.5)

## 2013-09-16 LAB — MICROALBUMIN / CREATININE URINE RATIO
Creatinine,U: 68 mg/dL
MICROALB UR: 1.4 mg/dL (ref 0.0–1.9)
Microalb Creat Ratio: 2.1 mg/g (ref 0.0–30.0)

## 2013-09-16 LAB — PSA: PSA: 1.63 ng/mL (ref 0.10–4.00)

## 2013-09-16 LAB — URINALYSIS, ROUTINE W REFLEX MICROSCOPIC
Bilirubin Urine: NEGATIVE
Hgb urine dipstick: NEGATIVE
Ketones, ur: NEGATIVE
LEUKOCYTES UA: NEGATIVE
Nitrite: NEGATIVE
RBC / HPF: NONE SEEN (ref 0–?)
SPECIFIC GRAVITY, URINE: 1.015 (ref 1.000–1.030)
Total Protein, Urine: NEGATIVE
UROBILINOGEN UA: 0.2 (ref 0.0–1.0)
Urine Glucose: >=1000 — AB
WBC, UA: NONE SEEN (ref 0–?)
pH: 6 (ref 5.0–8.0)

## 2013-09-16 LAB — HEPATIC FUNCTION PANEL
ALBUMIN: 4.5 g/dL (ref 3.5–5.2)
ALT: 28 U/L (ref 0–53)
AST: 19 U/L (ref 0–37)
Alkaline Phosphatase: 62 U/L (ref 39–117)
BILIRUBIN TOTAL: 1 mg/dL (ref 0.3–1.2)
Bilirubin, Direct: 0.2 mg/dL (ref 0.0–0.3)
Total Protein: 7.7 g/dL (ref 6.0–8.3)

## 2013-09-16 LAB — LIPID PANEL
CHOL/HDL RATIO: 3
Cholesterol: 136 mg/dL (ref 0–200)
HDL: 46.9 mg/dL (ref >39.00)
LDL Cholesterol: 70 mg/dL (ref 0–99)
TRIGLYCERIDES: 98 mg/dL (ref 0.0–149.0)
VLDL: 19.6 mg/dL (ref 0.0–40.0)

## 2013-09-16 LAB — HEMOGLOBIN A1C: Hgb A1c MFr Bld: 7.7 % — ABNORMAL HIGH (ref 4.6–6.5)

## 2013-09-16 LAB — BASIC METABOLIC PANEL
BUN: 13 mg/dL (ref 6–23)
CO2: 25 meq/L (ref 19–32)
CREATININE: 1 mg/dL (ref 0.4–1.5)
Calcium: 9.6 mg/dL (ref 8.4–10.5)
Chloride: 105 mEq/L (ref 96–112)
GFR: 78.67 mL/min (ref >60.00)
GLUCOSE: 197 mg/dL — AB (ref 70–99)
Potassium: 3.8 mEq/L (ref 3.5–5.1)
Sodium: 139 mEq/L (ref 135–145)

## 2013-09-16 LAB — TSH: TSH: 1.95 u[IU]/mL (ref 0.35–5.50)

## 2013-09-21 ENCOUNTER — Ambulatory Visit (INDEPENDENT_AMBULATORY_CARE_PROVIDER_SITE_OTHER): Payer: 59 | Admitting: Endocrinology

## 2013-09-21 ENCOUNTER — Encounter: Payer: Self-pay | Admitting: Endocrinology

## 2013-09-21 VITALS — BP 124/76 | HR 87 | Temp 98.4°F | Ht 67.0 in | Wt 150.4 lb

## 2013-09-21 DIAGNOSIS — Z0000 Encounter for general adult medical examination without abnormal findings: Secondary | ICD-10-CM

## 2013-09-21 DIAGNOSIS — E119 Type 2 diabetes mellitus without complications: Secondary | ICD-10-CM

## 2013-09-21 MED ORDER — CANAGLIFLOZIN 100 MG PO TABS: 1.0000 | ORAL_TABLET | Freq: Every day | ORAL | Status: DC

## 2013-09-21 MED ORDER — BROMOCRIPTINE MESYLATE 2.5 MG PO TABS: 1.2500 mg | ORAL_TABLET | Freq: Every day | ORAL | Status: DC

## 2013-09-21 NOTE — Patient Instructions (Addendum)
Please add "bromocriptine," to help your blood sugar. It has possible side effects of nausea and dizziness.  These go away with time.  You can avoid these by taking it at bedtime.  i have sent a prescription to your pharmacy.   please consider these measures for your health:  minimize alcohol.  do not use tobacco products.  have a colonoscopy at least every 10 years from age 59.  keep firearms safely stored.  always use seat belts.  have working smoke alarms in your home.  see an eye doctor and dentist regularly.  never drive under the influence of alcohol or drugs (including prescription drugs).  those with fair skin should take precautions against the sun.   Please come back for a follow-up appointment in 3 months.

## 2013-09-21 NOTE — Progress Notes (Signed)
Subjective:    Patient ID: Elijah Cervantes, male    DOB: 1956-05-02, 58 y.o.   MRN: [id number]  HPI Pt is here for regular wellness examination, and is feeling pretty well in general, and says chronic med probs are stable, except as noted below. Past Medical History  Diagnosis Date  . DIABETES MELLITUS, TYPE II 03/31/2007  . DEPRESSION 03/31/2007  . HYPERTENSION 03/31/2007  . GERD 07/26/2009  . FATTY LIVER DISEASE 07/18/2008  . 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 HYPERCHOLESTEROLEMIA 10/31/2010  . Dyspepsia   . NASH (nonalcoholic steatohepatitis)   . HSV-1 infection   . ABNORMAL ELECTROCARDIOGRAM 07/26/2009  . Personal history of colonic adenoma 02/10/2013    Past Surgical History  Procedure Laterality Date  . Stress cardiolite  11/22/2003  . Hernia repair      History   Social History  . Marital Status: Married    Spouse Name: N/A    Number of Children: N/A  . Years of Education: N/A   Occupational History  . Unemployed    Social History Main Topics  . Smoking status: Never Smoker   . Smokeless tobacco: Never Used  . Alcohol Use: Yes     Comment: occasional use  . Drug Use: No  . Sexual Activity: Not on file   Other Topics Concern  . Not on file   Social History Narrative  . No narrative on file    Current Outpatient Prescriptions on File Prior to Visit  Medication Sig Dispense Refill  . aspirin 81 MG tablet Take 1 tablet (81 mg total) by mouth daily.  30 tablet  0  . atorvastatin (LIPITOR) 40 MG tablet Take 1 tablet (40 mg total) by mouth daily.  90 tablet  3  . glucose blood (ONE TOUCH ULTRA TEST) test strip Test twice a day as instructed  100 each  5  . HYDROcodone-acetaminophen (NORCO) 10-325 MG per tablet 1/2-1 tablet every 4 hours as needed for pain  50 tablet  3  . JANUVIA 100 MG tablet TAKE 1 TABLET BY MOUTH DAILY  90 tablet  4  . Lancets (ONETOUCH ULTRASOFT) lancets TEST TWICE A DAY AS DIRECTED  100 each  6  . lisinopril (PRINIVIL,ZESTRIL) 5  MG tablet TAKE 1 TABLET BY MOUTH ONCE DAILY  90 tablet  0  . methocarbamol (ROBAXIN) 500 MG tablet TAKE 1 TABLET BY MOUTH AT BEDTIME FOR LEG CRAMPS  90 tablet  1  . nateglinide (STARLIX) 120 MG tablet Take 1 tablet (120 mg total) by mouth 3 (three) times daily before meals.  270 tablet  3  . pantoprazole (PROTONIX) 40 MG tablet Take 1 tablet (40 mg total) by mouth daily.  90 tablet  3  . pioglitazone-metformin (ACTOPLUS MET) 15-500 MG per tablet TAKE 2 TABLETS BY MOUTH EVERY MORNING AND 1 TABLET EVERY EVENING  270 tablet  4  . triamcinolone (KENALOG) 0.025 % ointment Apply to affected area three times a day as needed for rash  30 g  0   No current facility-administered medications on file prior to visit.    No Known Allergies  Family History  Problem Relation Age of Onset  . Diabetes Mother   . Diabetes Brother   . Cancer Neg Hx   . Colon cancer Neg Hx   . Esophageal cancer Neg Hx   . Stomach cancer Neg Hx   . Rectal cancer Neg Hx     BP 124/76  Pulse 87  Temp(Src) 98.4 F (36.9  C) (Oral)  Ht _0  (1.702 m)  Wt 150 lb 7 oz (68.238 kg)  BMI 23.56 kg/m2  SpO2 95%  Review of Systems  Constitutional: Negative for fever.  HENT: Negative for hearing loss.   Eyes: Negative for visual disturbance.  Respiratory: Negative for shortness of breath.   Cardiovascular: Negative for chest pain.  Gastrointestinal: Negative for anal bleeding.  Endocrine: Negative for cold intolerance.  Genitourinary: Negative for hematuria and difficulty urinating.  Musculoskeletal: Negative for back pain.  Skin: Negative for rash.  Allergic/Immunologic: Negative for environmental allergies.  Neurological: Negative for syncope and numbness.  Hematological: Does not bruise/bleed easily.  Psychiatric/Behavioral: Negative for dysphoric mood.       Objective:   Physical Exam VS: see vs page GEN: no distress HEAD: head: no deformity eyes: no periorbital swelling, no proptosis external nose and ears  are normal mouth: no lesion seen NECK: supple, thyroid is not enlarged CHEST WALL: no deformity LUNGS: clear to auscultation BREASTS:  No gynecomastia CV: reg rate and rhythm, no murmur ABD: abdomen is soft, nontender.  no hepatosplenomegaly.  not distended.  no hernia RECTAL/PROSTATE:  refused MUSCULOSKELETAL: muscle bulk and strength are grossly normal.  no obvious joint swelling.  gait is normal and steady EXTEMITIES: no deformity.  no ulcer on the feet.  feet are of normal color and temp.  no edema PULSES: dorsalis pedis intact bilat.  no carotid bruit NEURO:  cn 2-12 grossly intact.   readily moves all 4's.  sensation is intact to touch on the feet SKIN:  Normal texture and temperature.  No rash or suspicious lesion is visible.   NODES:  None palpable at the neck PSYCH: alert, well-oriented.  Does not appear anxious nor depressed.       Assessment & Plan:  Wellness visit today, with problems stable, except as noted. we discussed code status.  He says he'll think it over.       SEPARATE EVALUATION FOLLOWS--EACH PROBLEM HERE IS NEW, NOT RESPONDING TO TREATMENT, OR POSES SIGNIFICANT RISK TO THE PATIENT'S HEALTH: HISTORY OF THE PRESENT ILLNESS: Pt returns for f/u of type 2 DM (dx'ed 1996; he has mild if any neuropathy of the lower extremities; no known associated complications; he has declined parlodel, acarbose, and welchol; he has never been on insulin).  no cbg record, but states cbg's are in the mid-100's.  pt states he feels well in general.  However, he says he's try the parlodel now.  PAST MEDICAL HISTORY reviewed and up to date today REVIEW OF SYSTEMS: Denies weight change. PHYSICAL EXAMINATION: VITAL SIGNS:  See vs page GENERAL: no distress LAB/XRAY RESULTS: Lab Results  Component Value Date   HGBA1C 7.7* 09/16/2013  IMPRESSION: Type 2 DM: Needs increased rx, if it can be done with a regimen that avoids or minimizes hypoglycemia. PLAN: See instruction page

## 2013-10-04 ENCOUNTER — Other Ambulatory Visit: Payer: Self-pay | Admitting: Endocrinology

## 2013-11-01 ENCOUNTER — Other Ambulatory Visit: Payer: Self-pay | Admitting: Endocrinology

## 2013-12-01 ENCOUNTER — Other Ambulatory Visit: Payer: Self-pay | Admitting: Endocrinology

## 2013-12-20 ENCOUNTER — Other Ambulatory Visit (INDEPENDENT_AMBULATORY_CARE_PROVIDER_SITE_OTHER): Payer: 59

## 2013-12-20 ENCOUNTER — Other Ambulatory Visit: Payer: Self-pay

## 2013-12-20 DIAGNOSIS — E119 Type 2 diabetes mellitus without complications: Secondary | ICD-10-CM

## 2013-12-20 LAB — URINALYSIS, ROUTINE W REFLEX MICROSCOPIC
BILIRUBIN URINE: NEGATIVE
HGB URINE DIPSTICK: NEGATIVE
Leukocytes, UA: NEGATIVE
Nitrite: NEGATIVE
RBC / HPF: NONE SEEN (ref 0–?)
Specific Gravity, Urine: 1.015 (ref 1.000–1.030)
Total Protein, Urine: NEGATIVE
UROBILINOGEN UA: 0.2 (ref 0.0–1.0)
WBC UA: NONE SEEN (ref 0–?)
pH: 5.5 (ref 5.0–8.0)

## 2013-12-20 LAB — CBC WITH DIFFERENTIAL/PLATELET
Basophils Absolute: 0.1 10*3/uL (ref 0.0–0.1)
Basophils Relative: 0.9 % (ref 0.0–3.0)
EOS PCT: 1.7 % (ref 0.0–5.0)
Eosinophils Absolute: 0.1 10*3/uL (ref 0.0–0.7)
HEMATOCRIT: 46.5 % (ref 39.0–52.0)
Hemoglobin: 15.4 g/dL (ref 13.0–17.0)
LYMPHS ABS: 2.3 10*3/uL (ref 0.7–4.0)
LYMPHS PCT: 37 % (ref 12.0–46.0)
MCHC: 33.2 g/dL (ref 30.0–36.0)
MCV: 93.5 fl (ref 78.0–100.0)
MONOS PCT: 4.4 % (ref 3.0–12.0)
Monocytes Absolute: 0.3 10*3/uL (ref 0.1–1.0)
Neutro Abs: 3.4 10*3/uL (ref 1.4–7.7)
Neutrophils Relative %: 56 % (ref 43.0–77.0)
PLATELETS: 222 10*3/uL (ref 150.0–400.0)
RBC: 4.98 Mil/uL (ref 4.22–5.81)
RDW: 13.2 % (ref 11.5–14.6)
WBC: 6.1 10*3/uL (ref 4.5–10.5)

## 2013-12-20 LAB — BASIC METABOLIC PANEL
BUN: 17 mg/dL (ref 6–23)
CO2: 25 mEq/L (ref 19–32)
Calcium: 9.6 mg/dL (ref 8.4–10.5)
Chloride: 105 mEq/L (ref 96–112)
Creatinine, Ser: 1 mg/dL (ref 0.4–1.5)
GFR: 82.28 mL/min (ref >60.00)
Glucose, Bld: 154 mg/dL — ABNORMAL HIGH (ref 70–99)
POTASSIUM: 4 meq/L (ref 3.5–5.1)
Sodium: 140 mEq/L (ref 135–145)

## 2013-12-20 LAB — PSA: PSA: 1.34 ng/mL (ref 0.10–4.00)

## 2013-12-20 LAB — LIPID PANEL
CHOLESTEROL: 123 mg/dL (ref 0–200)
HDL: 38.4 mg/dL — AB (ref >39.00)
LDL CALC: 63 mg/dL (ref 0–99)
Total CHOL/HDL Ratio: 3
Triglycerides: 109 mg/dL (ref 0.0–149.0)
VLDL: 21.8 mg/dL (ref 0.0–40.0)

## 2013-12-20 LAB — TSH: TSH: 0.95 u[IU]/mL (ref 0.35–5.50)

## 2013-12-20 LAB — HEMOGLOBIN A1C: HEMOGLOBIN A1C: 7.2 % — AB (ref 4.6–6.5)

## 2013-12-20 NOTE — Addendum Note (Signed)
Addended by: Loney Loh on: 12/20/2013 11:12 AM   Modules accepted: Orders

## 2013-12-21 ENCOUNTER — Ambulatory Visit (INDEPENDENT_AMBULATORY_CARE_PROVIDER_SITE_OTHER): Payer: 59 | Admitting: Endocrinology

## 2013-12-21 ENCOUNTER — Encounter: Payer: Self-pay | Admitting: Endocrinology

## 2013-12-21 VITALS — BP 116/60 | HR 82 | Temp 98.3°F | Ht 67.0 in | Wt 148.0 lb

## 2013-12-21 DIAGNOSIS — E119 Type 2 diabetes mellitus without complications: Secondary | ICD-10-CM

## 2013-12-21 MED ORDER — REPAGLINIDE 2 MG PO TABS: 2.0000 mg | ORAL_TABLET | Freq: Three times a day (TID) | ORAL | Status: DC

## 2013-12-21 MED ORDER — CANAGLIFLOZIN 100 MG PO TABS: 1.0000 | ORAL_TABLET | Freq: Every day | ORAL | Status: DC

## 2013-12-21 MED ORDER — ATORVASTATIN CALCIUM 40 MG PO TABS: ORAL_TABLET | ORAL | Status: DC

## 2013-12-21 MED ORDER — METHOCARBAMOL 500 MG PO TABS: ORAL_TABLET | ORAL | Status: DC

## 2013-12-21 MED ORDER — BROMOCRIPTINE MESYLATE 2.5 MG PO TABS: 1.2500 mg | ORAL_TABLET | Freq: Every day | ORAL | Status: DC

## 2013-12-21 MED ORDER — LISINOPRIL 5 MG PO TABS: ORAL_TABLET | ORAL | Status: DC

## 2013-12-21 NOTE — Progress Notes (Signed)
Subjective:    Patient ID: Elijah Cervantes, male    DOB: 1956-02-22, 58 y.o.   MRN: [id number]  HPI Pt returns for f/u of type 2 DM (dx'ed 1996, on a routine blood test; he has mild if any neuropathy of the lower extremities; no known associated complications; he has declined acarbose and welchol; he has never been on insulin).  he brings a record of his cbg's which i have reviewed today.  All are in the low-100's. pt states he feels well in general. Past Medical History  Diagnosis Date  . DIABETES MELLITUS, TYPE II 03/31/2007  . DEPRESSION 03/31/2007  . HYPERTENSION 03/31/2007  . GERD 07/26/2009  . FATTY LIVER DISEASE 07/18/2008  . 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 HYPERCHOLESTEROLEMIA 10/31/2010  . Dyspepsia   . NASH (nonalcoholic steatohepatitis)   . HSV-1 infection   . ABNORMAL ELECTROCARDIOGRAM 07/26/2009  . Personal history of colonic adenoma 02/10/2013    Past Surgical History  Procedure Laterality Date  . Stress cardiolite  11/22/2003  . Hernia repair      History   Social History  . Marital Status: Married    Spouse Name: N/A    Number of Children: N/A  . Years of Education: N/A   Occupational History  . Unemployed    Social History Main Topics  . Smoking status: Never Smoker   . Smokeless tobacco: Never Used  . Alcohol Use: Yes     Comment: occasional use  . Drug Use: No  . Sexual Activity: Not on file   Other Topics Concern  . Not on file   Social History Narrative  . No narrative on file    Current Outpatient Prescriptions on File Prior to Visit  Medication Sig Dispense Refill  . aspirin 81 MG tablet Take 1 tablet (81 mg total) by mouth daily.  30 tablet  0  . glucose blood (ONE TOUCH ULTRA TEST) test strip Test twice a day as instructed  100 each  5  . HYDROcodone-acetaminophen (NORCO) 10-325 MG per tablet 1/2-1 tablet every 4 hours as needed for pain  50 tablet  3  . JANUVIA 100 MG tablet TAKE 1 TABLET BY MOUTH ONCE DAILY  90 tablet  4    . Lancets (ONETOUCH ULTRASOFT) lancets TEST TWICE A DAY AS DIRECTED  100 each  6  . pantoprazole (PROTONIX) 40 MG tablet Take 1 tablet (40 mg total) by mouth daily.  90 tablet  3  . pioglitazone-metformin (ACTOPLUS MET) 15-500 MG per tablet TAKE 2 TABLETS BY MOUTH EVERY MORNING AND 1 TABLET BY MOUTH EVERY EVENING  270 tablet  4  . triamcinolone (KENALOG) 0.025 % ointment Apply to affected area three times a day as needed for rash  30 g  0   No current facility-administered medications on file prior to visit.    No Known Allergies  Family History  Problem Relation Age of Onset  . Diabetes Mother   . Diabetes Brother   . Cancer Neg Hx   . Colon cancer Neg Hx   . Esophageal cancer Neg Hx   . Stomach cancer Neg Hx   . Rectal cancer Neg Hx     BP 116/60  Pulse 82  Temp(Src) 98.3 F (36.8 C) (Oral)  Ht 5' 7" (1.702 m)  Wt 148 lb (67.132 kg)  BMI 23.17 kg/m2  SpO2 97%  Review of Systems He denies hypoglycemia and weight change    Objective:   Physical Exam VITAL SIGNS:  See vs page GENERAL: no distress  Lab Results  Component Value Date   HGBA1C 7.2* 12/20/2013   Lab Results  Component Value Date   ALT 28 09/16/2013   AST 19 09/16/2013   ALKPHOS 62 09/16/2013   BILITOT 1.0 09/16/2013      Assessment & Plan:  Type 2 DM: he needs increased rx.  He will likely need insulin soon. NASH: well-controlled on actos.

## 2013-12-21 NOTE — Patient Instructions (Addendum)
Please change nateglinide to repaglinide.  i have sent a prescription to your pharmacy.   Please come back for a follow-up appointment in 3 months check your blood sugar once a day.  vary the time of day when you check, between before the 3 meals, and at bedtime.  also check if you have symptoms of your blood sugar being too high or too low.  please keep a record of the readings and bring it to your next appointment here.  You can write it on any piece of paper.  please call us sooner if your blood sugar goes below 70, or if you have a lot of readings over 200. Please see Elijah Cervantes today, to learn about insulin.

## 2014-03-22 ENCOUNTER — Ambulatory Visit: Payer: 59 | Admitting: Endocrinology

## 2014-03-23 ENCOUNTER — Other Ambulatory Visit: Payer: Self-pay | Admitting: Endocrinology

## 2014-03-30 ENCOUNTER — Other Ambulatory Visit: Payer: Self-pay | Admitting: Endocrinology

## 2014-06-15 ENCOUNTER — Other Ambulatory Visit: Payer: Self-pay | Admitting: Endocrinology

## 2014-06-17 ENCOUNTER — Other Ambulatory Visit: Payer: Self-pay

## 2014-06-27 ENCOUNTER — Telehealth: Payer: Self-pay | Admitting: Endocrinology

## 2014-06-27 DIAGNOSIS — E119 Type 2 diabetes mellitus without complications: Secondary | ICD-10-CM

## 2014-06-27 NOTE — Telephone Encounter (Signed)
done

## 2014-06-27 NOTE — Telephone Encounter (Signed)
See below and please advise what lab orders need to be done. Thanks!

## 2014-06-27 NOTE — Telephone Encounter (Signed)
Patient would like to go [facility] for labs tomorrow  Please put lab orders in EPIC   Thank you

## 2014-06-29 ENCOUNTER — Other Ambulatory Visit (INDEPENDENT_AMBULATORY_CARE_PROVIDER_SITE_OTHER): Payer: 59

## 2014-06-29 DIAGNOSIS — E119 Type 2 diabetes mellitus without complications: Secondary | ICD-10-CM

## 2014-06-29 LAB — HEMOGLOBIN A1C: HEMOGLOBIN A1C: 7.2 % — AB (ref 4.6–6.5)

## 2014-06-30 ENCOUNTER — Ambulatory Visit (INDEPENDENT_AMBULATORY_CARE_PROVIDER_SITE_OTHER): Payer: 59 | Admitting: Endocrinology

## 2014-06-30 ENCOUNTER — Encounter: Payer: Self-pay | Admitting: Endocrinology

## 2014-06-30 VITALS — BP 136/74 | HR 78 | Temp 97.9°F | Ht 67.0 in | Wt 149.0 lb

## 2014-06-30 DIAGNOSIS — Z23 Encounter for immunization: Secondary | ICD-10-CM

## 2014-06-30 DIAGNOSIS — E119 Type 2 diabetes mellitus without complications: Secondary | ICD-10-CM

## 2014-06-30 LAB — HM DIABETES EYE EXAM

## 2014-06-30 MED ORDER — TRIAMCINOLONE ACETONIDE 0.025 % EX OINT
TOPICAL_OINTMENT | CUTANEOUS | Status: DC
Start: 2014-06-30 — End: 2021-05-11

## 2014-06-30 NOTE — Progress Notes (Signed)
Subjective:    Patient ID: Elijah Cervantes, male    DOB: 11/19/1955, 58 y.o.   MRN: [id number]  HPI Pt returns for f/u of diabetes mellitus: DM type: 2 Dx'ed: 5035 Complications: none Therapy: 6 oral meds DKA: never Severe hypoglycemia: never Pancreatitis: never Other: he has declined acarbose and welchol; he declines insulin.   Interval history: no cbg record, but states cbg's are well-controlled.  pt states he feels well in general.  He was in the [place] for the past 5 months.  He says he was much more active when he was there.  He ran out of some of his medications then.  He is back on, since he returned here, 2 weeks ago.   Pt states 2 weeks of slight rash on the face, but no assoc itching.   Past Medical History  Diagnosis Date  . DIABETES MELLITUS, TYPE II 03/31/2007  . DEPRESSION 03/31/2007  . HYPERTENSION 03/31/2007  . GERD 07/26/2009  . FATTY LIVER DISEASE 07/18/2008  . 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 HYPERCHOLESTEROLEMIA 10/31/2010  . Dyspepsia   . NASH (nonalcoholic steatohepatitis)   . HSV-1 infection   . ABNORMAL ELECTROCARDIOGRAM 07/26/2009  . Personal history of colonic adenoma 02/10/2013    Past Surgical History  Procedure Laterality Date  . Stress cardiolite  11/22/2003  . Hernia repair      History   Social History  . Marital Status: Married    Spouse Name: N/A    Number of Children: N/A  . Years of Education: N/A   Occupational History  . Unemployed    Social History Main Topics  . Smoking status: Never Smoker   . Smokeless tobacco: Never Used  . Alcohol Use: Yes     Comment: occasional use  . Drug Use: No  . Sexual Activity: Not on file   Other Topics Concern  . Not on file   Social History Narrative  . No narrative on file    Current Outpatient Prescriptions on File Prior to Visit  Medication Sig Dispense Refill  . aspirin 81 MG tablet Take 1 tablet (81 mg total) by mouth daily.  30 tablet  0  . atorvastatin  (LIPITOR) 40 MG tablet TAKE 1 TABLET BY MOUTH DAILY  90 tablet  3  . bromocriptine (PARLODEL) 2.5 MG tablet Take 0.5 tablets (1.25 mg total) by mouth at bedtime.  45 tablet  3  . Canagliflozin (INVOKANA) 100 MG TABS Take 1 tablet (100 mg total) by mouth daily.  90 tablet  3  . glucose blood (ONE TOUCH ULTRA TEST) test strip Test twice a day as instructed  100 each  5  . HYDROcodone-acetaminophen (NORCO) 10-325 MG per tablet 1/2-1 tablet every 4 hours as needed for pain  50 tablet  3  . JANUVIA 100 MG tablet TAKE 1 TABLET BY MOUTH ONCE DAILY  90 tablet  4  . Lancets (ONETOUCH ULTRASOFT) lancets TEST TWICE A DAY AS DIRECTED  100 each  6  . lisinopril (PRINIVIL,ZESTRIL) 5 MG tablet TAKE 1 TABLET BY MOUTH ONCE DAILY  90 tablet  0  . methocarbamol (ROBAXIN) 500 MG tablet TAKE 1 TABLET BY MOUTH AT BEDTIME FOR LEG CRAMPS  90 tablet  1  . pantoprazole (PROTONIX) 40 MG tablet TAKE 1 TABLET (40 MG TOTAL) BY MOUTH DAILY.  90 tablet  0  . pioglitazone-metformin (ACTOPLUS MET) 15-500 MG per tablet TAKE 2 TABLETS BY MOUTH EVERY MORNING AND 1 TABLET BY MOUTH EVERY EVENING  270  tablet  4  . repaglinide (PRANDIN) 2 MG tablet Take 1 tablet (2 mg total) by mouth 3 (three) times daily before meals.  270 tablet  3   No current facility-administered medications on file prior to visit.    No Known Allergies  Family History  Problem Relation Age of Onset  . Diabetes Mother   . Diabetes Brother   . Cancer Neg Hx   . Colon cancer Neg Hx   . Esophageal cancer Neg Hx   . Stomach cancer Neg Hx   . Rectal cancer Neg Hx     BP 136/74  Pulse 78  Temp(Src) 97.9 F (36.6 C) (Oral)  Ht 5' 7" (1.702 m)  Wt 149 lb (67.586 kg)  BMI 23.33 kg/m2  SpO2 96%   Review of Systems He denies hypoglycemia and weight change    Objective:   Physical Exam VITAL SIGNS:  See vs page.   GENERAL: no distress.   Pulses: dorsalis pedis intact bilat.   Feet: no deformity.  no edema.    Skin:  no ulcer on the feet.  normal  color and temp.   Neuro: sensation is intact to touch on the feet.   Face: mild eczematous rash  Lab Results  Component Value Date   HGBA1C 7.2* 06/29/2014       Assessment & Plan:  DM: mild exacerbation.  He declines insulin. Rash, new.    Patient is advised the following: Patient Instructions  Please come back for a regular physical appointment in 3 months (must be after 09/21/14) check your blood sugar once a day.  vary the time of day when you check, between before the 3 meals, and at bedtime.  also check if you have symptoms of your blood sugar being too high or too low.  please keep a record of the readings and bring it to your next appointment here.  You can write it on any piece of paper.  please call us sooner if your blood sugar goes below 70, or if you have a lot of readings over 200.  i have sent a prescription to your pharmacy, for a skin cream.      

## 2014-06-30 NOTE — Patient Instructions (Addendum)
Please come back for a regular physical appointment in 3 months (must be after 09/21/14) check your blood sugar once a day.  vary the time of day when you check, between before the 3 meals, and at bedtime.  also check if you have symptoms of your blood sugar being too high or too low.  please keep a record of the readings and bring it to your next appointment here.  You can write it on any piece of paper.  please call us sooner if your blood sugar goes below 70, or if you have a lot of readings over 200.  i have sent a prescription to your pharmacy, for a skin cream.

## 2014-07-01 ENCOUNTER — Encounter: Payer: Self-pay | Admitting: Endocrinology

## 2014-09-16 ENCOUNTER — Other Ambulatory Visit: Payer: Self-pay

## 2014-09-16 ENCOUNTER — Other Ambulatory Visit (INDEPENDENT_AMBULATORY_CARE_PROVIDER_SITE_OTHER): Payer: 59

## 2014-09-16 DIAGNOSIS — E119 Type 2 diabetes mellitus without complications: Secondary | ICD-10-CM

## 2014-09-16 LAB — HEMOGLOBIN A1C: Hgb A1c MFr Bld: 6.8 % — ABNORMAL HIGH (ref 4.6–6.5)

## 2014-09-19 ENCOUNTER — Other Ambulatory Visit: Payer: Self-pay | Admitting: Endocrinology

## 2014-09-21 ENCOUNTER — Ambulatory Visit (INDEPENDENT_AMBULATORY_CARE_PROVIDER_SITE_OTHER): Payer: 59 | Admitting: Endocrinology

## 2014-09-21 ENCOUNTER — Encounter: Payer: Self-pay | Admitting: Endocrinology

## 2014-09-21 ENCOUNTER — Other Ambulatory Visit: Payer: Self-pay

## 2014-09-21 VITALS — BP 122/80 | HR 90 | Temp 98.3°F | Ht 67.0 in | Wt 148.0 lb

## 2014-09-21 DIAGNOSIS — K7581 Nonalcoholic steatohepatitis (NASH): Secondary | ICD-10-CM

## 2014-09-21 DIAGNOSIS — E119 Type 2 diabetes mellitus without complications: Secondary | ICD-10-CM

## 2014-09-21 DIAGNOSIS — Z125 Encounter for screening for malignant neoplasm of prostate: Secondary | ICD-10-CM

## 2014-09-21 MED ORDER — PANTOPRAZOLE SODIUM 40 MG PO TBEC: DELAYED_RELEASE_TABLET | ORAL | Status: DC

## 2014-09-21 MED ORDER — REPAGLINIDE 2 MG PO TABS: 2.0000 mg | ORAL_TABLET | Freq: Three times a day (TID) | ORAL | Status: DC

## 2014-09-21 MED ORDER — JANUVIA 100 MG PO TABS: 100.0000 mg | ORAL_TABLET | Freq: Every day | ORAL | Status: DC

## 2014-09-21 MED ORDER — ATORVASTATIN CALCIUM 40 MG PO TABS: ORAL_TABLET | ORAL | Status: DC

## 2014-09-21 MED ORDER — CANAGLIFLOZIN 100 MG PO TABS: 100.0000 mg | ORAL_TABLET | Freq: Every day | ORAL | Status: DC

## 2014-09-21 MED ORDER — LISINOPRIL 5 MG PO TABS: 5.0000 mg | ORAL_TABLET | Freq: Every day | ORAL | Status: DC

## 2014-09-21 MED ORDER — PIOGLITAZONE HCL-METFORMIN HCL 15-500 MG PO TABS: ORAL_TABLET | ORAL | Status: DC

## 2014-09-21 MED ORDER — BROMOCRIPTINE MESYLATE 2.5 MG PO TABS: 1.2500 mg | ORAL_TABLET | Freq: Every day | ORAL | Status: DC

## 2014-09-21 NOTE — Patient Instructions (Signed)
please consider these measures for your health:  minimize alcohol.  do not use tobacco products.  have a colonoscopy at least every 10 years from age 60.  keep firearms safely stored.  always use seat belts.  have working smoke alarms in your home.  see an eye doctor and dentist regularly.  never drive under the influence of alcohol or drugs (including prescription drugs).   Please come back for a follow-up appointment in 3 months.

## 2014-09-21 NOTE — Progress Notes (Signed)
Subjective:    Patient ID: Elijah Cervantes, male    DOB: [birth year], 59 y.o.   MRN: [id number]  HPI Pt is here for regular wellness examination, and is feeling pretty well in general, and says chronic med probs are stable, except as noted below Past Medical History  Diagnosis Date  . DIABETES MELLITUS, TYPE II 03/31/2007  . DEPRESSION 03/31/2007  . HYPERTENSION 03/31/2007  . GERD 07/26/2009  . FATTY LIVER DISEASE 07/18/2008  . 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 HYPERCHOLESTEROLEMIA 10/31/2010  . Dyspepsia   . NASH (nonalcoholic steatohepatitis)   . HSV-1 infection   . ABNORMAL ELECTROCARDIOGRAM 07/26/2009  . Personal history of colonic adenoma 02/10/2013    Past Surgical History  Procedure Laterality Date  . Stress cardiolite  11/22/2003  . Hernia repair      History   Social History  . Marital Status: Married    Spouse Name: N/A    Number of Children: N/A  . Years of Education: N/A   Occupational History  . Unemployed    Social History Main Topics  . Smoking status: Never Smoker   . Smokeless tobacco: Never Used  . Alcohol Use: Yes     Comment: occasional use  . Drug Use: No  . Sexual Activity: Not on file   Other Topics Concern  . Not on file   Social History Narrative    Current Outpatient Prescriptions on File Prior to Visit  Medication Sig Dispense Refill  . aspirin 81 MG tablet Take 1 tablet (81 mg total) by mouth daily. 30 tablet 0  . glucose blood (ONE TOUCH ULTRA TEST) test strip Test twice a day as instructed 100 each 5  . HYDROcodone-acetaminophen (NORCO) 10-325 MG per tablet 1/2-1 tablet every 4 hours as needed for pain 50 tablet 3  . Lancets (ONETOUCH ULTRASOFT) lancets TEST TWICE A DAY AS DIRECTED 100 each 6  . methocarbamol (ROBAXIN) 500 MG tablet TAKE 1 TABLET BY MOUTH AT BEDTIME FOR LEG CRAMPS 90 tablet 1  . triamcinolone (KENALOG) 0.025 % ointment Apply to affected area three times a day as needed for rash 80 g 3   No current  facility-administered medications on file prior to visit.    No Known Allergies  Family History  Problem Relation Age of Onset  . Diabetes Mother   . Diabetes Brother   . Cancer Neg Hx   . Colon cancer Neg Hx   . Esophageal cancer Neg Hx   . Stomach cancer Neg Hx   . Rectal cancer Neg Hx     BP 122/80 mmHg  Pulse 90  Temp(Src) 98.3 F (36.8 C) (Oral)  Ht 5\' 7"  (1.702 m)  Wt 148 lb (67.132 kg)  BMI 23.17 kg/m2  SpO2 96%     Review of Systems  Constitutional: Negative for fever.       He has lost a few lbs, due to his efforts  HENT: Negative for hearing loss.   Eyes: Negative for visual disturbance.  Respiratory: Negative for shortness of breath.   Cardiovascular: Negative for chest pain.  Gastrointestinal: Negative for anal bleeding.  Endocrine: Negative for cold intolerance.  Genitourinary: Negative for hematuria.  Musculoskeletal: Negative for back pain.  Skin: Negative for rash.  Allergic/Immunologic: Negative for environmental allergies.  Neurological: Negative for syncope.  Hematological: Does not bruise/bleed easily.  Psychiatric/Behavioral: Negative for dysphoric mood.       Objective:   Physical Exam VS: see vs page GEN: no distress HEAD: head:  no deformity eyes: no periorbital swelling, no proptosis external nose and ears are normal mouth: no lesion seen NECK: supple, thyroid is not enlarged.  CHEST WALL: no deformity LUNGS: clear to auscultation BREASTS:  No gynecomastia CV: reg rate and rhythm, no murmur ABD: abdomen is soft, nontender.  no hepatosplenomegaly.  not distended.  no hernia RECTAL: normal external and internal exam.  heme neg. PROSTATE:  Normal size.  No nodule MUSCULOSKELETAL: muscle bulk and strength are grossly normal.  no obvious joint swelling.  gait is normal and steady. EXTEMITIES: no deformity.  no ulcer on the feet.  feet are of normal color and temp.  no edema PULSES: dorsalis pedis intact bilat.  no carotid  bruit NEURO:  cn 2-12 grossly intact.   readily moves all 4's.  sensation is intact to touch on the feet SKIN:  Normal texture and temperature.  No rash or suspicious lesion is visible.   NODES:  None palpable at the neck.   PSYCH: alert, well-oriented.  Does not appear anxious nor depressed.   i personally reviewed electrocardiogram tracing: normal    Assessment & Plan:  Wellness visit today, with problems stable, except as noted.  Patient is advised the following: Patient Instructions  please consider these measures for your health:  minimize alcohol.  do not use tobacco products.  have a colonoscopy at least every 10 years from age 17.  keep firearms safely stored.  always use seat belts.  have working smoke alarms in your home.  see an eye doctor and dentist regularly.  never drive under the influence of alcohol or drugs (including prescription drugs).   Please come back for a follow-up appointment in 3 months.

## 2014-10-06 ENCOUNTER — Other Ambulatory Visit: Payer: Self-pay | Admitting: Endocrinology

## 2014-12-16 ENCOUNTER — Other Ambulatory Visit: Payer: Self-pay | Admitting: Endocrinology

## 2014-12-19 ENCOUNTER — Other Ambulatory Visit (INDEPENDENT_AMBULATORY_CARE_PROVIDER_SITE_OTHER): Payer: 59

## 2014-12-19 DIAGNOSIS — Z125 Encounter for screening for malignant neoplasm of prostate: Secondary | ICD-10-CM

## 2014-12-19 DIAGNOSIS — E119 Type 2 diabetes mellitus without complications: Secondary | ICD-10-CM | POA: Diagnosis not present

## 2014-12-19 DIAGNOSIS — K7581 Nonalcoholic steatohepatitis (NASH): Secondary | ICD-10-CM | POA: Diagnosis not present

## 2014-12-19 LAB — PSA: PSA: 1.54 ng/mL (ref 0.10–4.00)

## 2014-12-19 LAB — LIPID PANEL
Cholesterol: 126 mg/dL (ref 0–200)
HDL: 43.8 mg/dL (ref >39.00)
LDL CALC: 61 mg/dL (ref 0–99)
NONHDL: 82.2
TRIGLYCERIDES: 104 mg/dL (ref 0.0–149.0)
Total CHOL/HDL Ratio: 3
VLDL: 20.8 mg/dL (ref 0.0–40.0)

## 2014-12-19 LAB — HEPATIC FUNCTION PANEL
ALBUMIN: 4.4 g/dL (ref 3.5–5.2)
ALK PHOS: 54 U/L (ref 39–117)
ALT: 25 U/L (ref 0–53)
AST: 22 U/L (ref 0–37)
BILIRUBIN DIRECT: 0.1 mg/dL (ref 0.0–0.3)
Total Bilirubin: 0.7 mg/dL (ref 0.2–1.2)
Total Protein: 7.4 g/dL (ref 6.0–8.3)

## 2014-12-19 LAB — URINALYSIS, ROUTINE W REFLEX MICROSCOPIC
Bilirubin Urine: NEGATIVE
Ketones, ur: NEGATIVE
Leukocytes, UA: NEGATIVE
NITRITE: NEGATIVE
PH: 5.5 (ref 5.0–8.0)
Specific Gravity, Urine: 1.01 (ref 1.000–1.030)
Total Protein, Urine: NEGATIVE
Urobilinogen, UA: 0.2 (ref 0.0–1.0)

## 2014-12-19 LAB — BASIC METABOLIC PANEL
BUN: 19 mg/dL (ref 6–23)
CO2: 25 mEq/L (ref 19–32)
CREATININE: 1.12 mg/dL (ref 0.40–1.50)
Calcium: 9.9 mg/dL (ref 8.4–10.5)
Chloride: 103 mEq/L (ref 96–112)
GFR: 71.12 mL/min (ref >60.00)
Glucose, Bld: 154 mg/dL — ABNORMAL HIGH (ref 70–99)
Potassium: 4.1 mEq/L (ref 3.5–5.1)
Sodium: 137 mEq/L (ref 135–145)

## 2014-12-19 LAB — TSH: TSH: 1.72 u[IU]/mL (ref 0.35–4.50)

## 2014-12-19 LAB — CBC WITH DIFFERENTIAL/PLATELET
BASOS ABS: 0 10*3/uL (ref 0.0–0.1)
Basophils Relative: 0.6 % (ref 0.0–3.0)
EOS PCT: 1.6 % (ref 0.0–5.0)
Eosinophils Absolute: 0.1 10*3/uL (ref 0.0–0.7)
HEMATOCRIT: 48.1 % (ref 39.0–52.0)
Hemoglobin: 15.9 g/dL (ref 13.0–17.0)
LYMPHS ABS: 2.7 10*3/uL (ref 0.7–4.0)
Lymphocytes Relative: 38.2 % (ref 12.0–46.0)
MCHC: 33.1 g/dL (ref 30.0–36.0)
MCV: 93.5 fl (ref 78.0–100.0)
Monocytes Absolute: 0.3 10*3/uL (ref 0.1–1.0)
Monocytes Relative: 4.5 % (ref 3.0–12.0)
NEUTROS ABS: 3.9 10*3/uL (ref 1.4–7.7)
Neutrophils Relative %: 55.1 % (ref 43.0–77.0)
PLATELETS: 216 10*3/uL (ref 150.0–400.0)
RBC: 5.14 Mil/uL (ref 4.22–5.81)
RDW: 13.6 % (ref 11.5–15.5)
WBC: 7.1 10*3/uL (ref 4.0–10.5)

## 2014-12-19 LAB — MICROALBUMIN / CREATININE URINE RATIO
Creatinine,U: 74.3 mg/dL
MICROALB UR: 1.3 mg/dL (ref 0.0–1.9)
Microalb Creat Ratio: 1.7 mg/g (ref 0.0–30.0)

## 2014-12-19 LAB — HEMOGLOBIN A1C: Hgb A1c MFr Bld: 6.4 % (ref 4.6–6.5)

## 2014-12-19 LAB — URIC ACID: Uric Acid, Serum: 6.5 mg/dL (ref 4.0–7.8)

## 2014-12-21 ENCOUNTER — Encounter: Payer: Self-pay | Admitting: Endocrinology

## 2014-12-21 ENCOUNTER — Ambulatory Visit (INDEPENDENT_AMBULATORY_CARE_PROVIDER_SITE_OTHER): Payer: 59 | Admitting: Endocrinology

## 2014-12-21 VITALS — BP 136/66 | HR 90 | Temp 98.1°F | Ht 67.0 in | Wt 153.0 lb

## 2014-12-21 DIAGNOSIS — E119 Type 2 diabetes mellitus without complications: Secondary | ICD-10-CM

## 2014-12-21 MED ORDER — METHOCARBAMOL 500 MG PO TABS: ORAL_TABLET | ORAL | Status: DC

## 2014-12-21 NOTE — Progress Notes (Signed)
Subjective:    Patient ID: Elijah Cervantes, male    DOB: 06-05-1956, 59 y.o.   MRN: [id number]  HPI  The state of at least three ongoing medical problems is addressed today, with interval history of each noted here: Pt returns for f/u of diabetes mellitus: DM type: 2 Dx'ed: 2947 Complications: none Therapy: 6 oral meds DKA: never Severe hypoglycemia: never Pancreatitis: never Other: he has declined acarbose and welchol; he declines insulin.  Interval history: no cbg record, but states cbg's are well-controlled.  pt states he feels well in general. Dyslipidemia: he denies chest pain HTN: denies sob Past Medical History  Diagnosis Date  . DIABETES MELLITUS, TYPE II 03/31/2007  . DEPRESSION 03/31/2007  . HYPERTENSION 03/31/2007  . GERD 07/26/2009  . FATTY LIVER DISEASE 07/18/2008  . 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 HYPERCHOLESTEROLEMIA 10/31/2010  . Dyspepsia   . NASH (nonalcoholic steatohepatitis)   . HSV-1 infection   . ABNORMAL ELECTROCARDIOGRAM 07/26/2009  . Personal history of colonic adenoma 02/10/2013    Past Surgical History  Procedure Laterality Date  . Stress cardiolite  11/22/2003  . Hernia repair      History   Social History  . Marital Status: Married    Spouse Name: N/A  . Number of Children: N/A  . Years of Education: N/A   Occupational History  . Unemployed    Social History Main Topics  . Smoking status: Never Smoker   . Smokeless tobacco: Never Used  . Alcohol Use: Yes     Comment: occasional use  . Drug Use: No  . Sexual Activity: Not on file   Other Topics Concern  . Not on file   Social History Narrative    Current Outpatient Prescriptions on File Prior to Visit  Medication Sig Dispense Refill  . aspirin 81 MG tablet Take 1 tablet (81 mg total) by mouth daily. 30 tablet 0  . atorvastatin (LIPITOR) 40 MG tablet TAKE 1 TABLET BY MOUTH DAILY 90 tablet 3  . bromocriptine (PARLODEL) 2.5 MG tablet Take 0.5 tablets (1.25 mg  total) by mouth at bedtime. 45 tablet 3  . canagliflozin (INVOKANA) 100 MG TABS tablet Take 1 tablet (100 mg total) by mouth daily. 90 tablet 3  . JANUVIA 100 MG tablet Take 1 tablet (100 mg total) by mouth daily. 90 tablet 4  . Lancets (ONETOUCH ULTRASOFT) lancets USE TO TEST TWICE A DAY AS DIRECTED 100 each 6  . lisinopril (PRINIVIL,ZESTRIL) 5 MG tablet Take 1 tablet (5 mg total) by mouth daily. 90 tablet 0  . ONE TOUCH ULTRA TEST test strip USE TO TEST BLOOD SUGAR TWICE A DAY AS DIRECTED 100 each 5  . pantoprazole (PROTONIX) 40 MG tablet TAKE 1 TABLET (40 MG TOTAL) BY MOUTH DAILY. 90 tablet 0  . pioglitazone-metformin (ACTOPLUS MET) 15-500 MG per tablet TAKE 2 TABLETS BY MOUTH EVERY MORNING AND 1 TABLET BY MOUTH EVERY EVENING 270 tablet 4  . repaglinide (PRANDIN) 2 MG tablet Take 1 tablet (2 mg total) by mouth 3 (three) times daily before meals. 270 tablet 3  . triamcinolone (KENALOG) 0.025 % ointment Apply to affected area three times a day as needed for rash 80 g 3   No current facility-administered medications on file prior to visit.    No Known Allergies  Family History  Problem Relation Age of Onset  . Diabetes Mother   . Diabetes Brother   . Cancer Neg Hx   . Colon cancer Neg Hx   .  Esophageal cancer Neg Hx   . Stomach cancer Neg Hx   . Rectal cancer Neg Hx     BP 136/66 mmHg  Pulse 90  Temp(Src) 98.1 F (36.7 C) (Oral)  Ht $R'5\' 7"'CU$  (1.702 m)  Wt 153 lb (69.4 kg)  BMI 23.96 kg/m2  SpO2 98%    Review of Systems He denies hypoglycemia.  He has gained a few lbs.    Objective:   Physical Exam VITAL SIGNS:  See vs page GENERAL: no distress Pulses: dorsalis pedis intact bilat.   MSK: no deformity of the feet CV: no leg edema Skin:  no ulcer on the feet.  normal color and temp on the feet. Neuro: sensation is intact to touch on the feet.     Lab Results  Component Value Date   HGBA1C 6.4 12/19/2014   Lab Results  Component Value Date   CHOL 126 12/19/2014    HDL 43.80 12/19/2014   LDLCALC 61 12/19/2014   LDLDIRECT 111.2 07/30/2010   TRIG 104.0 12/19/2014   CHOLHDL 3 12/19/2014      Assessment & Plan:  DM: well-controlled. HTN: well-controlled. Dyslipidemia: well-controlled.  Patient is advised the following: Patient Instructions  Please continue the same medications. check your blood sugar once a day.  vary the time of day when you check, between before the 3 meals, and at bedtime.  also check if you have symptoms of your blood sugar being too high or too low.  please keep a record of the readings and bring it to your next appointment here.  You can write it on any piece of paper.  please call us sooner if your blood sugar goes below 70, or if you have a lot of readings over 200. Please come back for a follow-up appointment in 3 months.

## 2014-12-21 NOTE — Patient Instructions (Signed)
Please continue the same medications.   check your blood sugar once a day.  vary the time of day when you check, between before the 3 meals, and at bedtime.  also check if you have symptoms of your blood sugar being too high or too low.  please keep a record of the readings and bring it to your next appointment here.  You can write it on any piece of paper.  please call us sooner if your blood sugar goes below 70, or if you have a lot of readings over 200.   Please come back for a follow-up appointment in 3 months.    

## 2015-03-15 ENCOUNTER — Other Ambulatory Visit: Payer: Self-pay | Admitting: Endocrinology

## 2015-03-20 ENCOUNTER — Other Ambulatory Visit (INDEPENDENT_AMBULATORY_CARE_PROVIDER_SITE_OTHER): Payer: 59

## 2015-03-20 ENCOUNTER — Other Ambulatory Visit: Payer: Self-pay

## 2015-03-20 DIAGNOSIS — E119 Type 2 diabetes mellitus without complications: Secondary | ICD-10-CM

## 2015-03-20 LAB — HEMOGLOBIN A1C: Hgb A1c MFr Bld: 6.9 % — ABNORMAL HIGH (ref 4.6–6.5)

## 2015-03-22 ENCOUNTER — Ambulatory Visit (INDEPENDENT_AMBULATORY_CARE_PROVIDER_SITE_OTHER): Payer: 59 | Admitting: Endocrinology

## 2015-03-22 ENCOUNTER — Encounter: Payer: Self-pay | Admitting: Endocrinology

## 2015-03-22 VITALS — BP 134/62 | HR 92 | Temp 98.1°F | Ht 67.0 in | Wt 156.0 lb

## 2015-03-22 DIAGNOSIS — E119 Type 2 diabetes mellitus without complications: Secondary | ICD-10-CM

## 2015-03-22 NOTE — Progress Notes (Signed)
Subjective:    Patient ID: Elijah Cervantes, male    DOB: [birth year], 59 y.o.   MRN: [id number]  HPI Pt returns for f/u of diabetes mellitus: DM type: 2 Dx'ed: 1996 Complications: none Therapy: 6 oral meds DKA: never Severe hypoglycemia: never.   Pancreatitis: never.   Other: he has declined acarbose and welchol; he declines insulin.  Interval history: no cbg record, but states cbg's are well-controlled.  pt states he feels well in general.   Past Medical History  Diagnosis Date  . DIABETES MELLITUS, TYPE II 03/31/2007  . DEPRESSION 03/31/2007  . HYPERTENSION 03/31/2007  . GERD 07/26/2009  . FATTY LIVER DISEASE 07/18/2008  . 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 HYPERCHOLESTEROLEMIA 10/31/2010  . Dyspepsia   . NASH (nonalcoholic steatohepatitis)   . HSV-1 infection   . ABNORMAL ELECTROCARDIOGRAM 07/26/2009  . Personal history of colonic adenoma 02/10/2013    Past Surgical History  Procedure Laterality Date  . Stress cardiolite  11/22/2003  . Hernia repair      History   Social History  . Marital Status: Married    Spouse Name: N/A  . Number of Children: N/A  . Years of Education: N/A   Occupational History  . Unemployed    Social History Main Topics  . Smoking status: Never Smoker   . Smokeless tobacco: Never Used  . Alcohol Use: Yes     Comment: occasional use  . Drug Use: No  . Sexual Activity: Not on file   Other Topics Concern  . Not on file   Social History Narrative    Current Outpatient Prescriptions on File Prior to Visit  Medication Sig Dispense Refill  . aspirin 81 MG tablet Take 1 tablet (81 mg total) by mouth daily. 30 tablet 0  . atorvastatin (LIPITOR) 40 MG tablet TAKE 1 TABLET BY MOUTH DAILY 90 tablet 3  . bromocriptine (PARLODEL) 2.5 MG tablet Take 0.5 tablets (1.25 mg total) by mouth at bedtime. 45 tablet 3  . canagliflozin (INVOKANA) 100 MG TABS tablet Take 1 tablet (100 mg total) by mouth daily. 90 tablet 3  . JANUVIA 100 MG  tablet Take 1 tablet (100 mg total) by mouth daily. 90 tablet 4  . Lancets (ONETOUCH ULTRASOFT) lancets USE TO TEST TWICE A DAY AS DIRECTED 100 each 6  . lisinopril (PRINIVIL,ZESTRIL) 5 MG tablet TAKE 1 TABLET BY MOUTH ONCE DAILY 90 tablet 0  . methocarbamol (ROBAXIN) 500 MG tablet TAKE 1 TABLET BY MOUTH AT BEDTIME FOR LEG CRAMPS 90 tablet 1  . ONE TOUCH ULTRA TEST test strip USE TO TEST BLOOD SUGAR TWICE A DAY AS DIRECTED 100 each 5  . pantoprazole (PROTONIX) 40 MG tablet TAKE 1 TABLET (40 MG TOTAL) BY MOUTH DAILY. 90 tablet 0  . pioglitazone-metformin (ACTOPLUS MET) 15-500 MG per tablet TAKE 2 TABLETS BY MOUTH EVERY MORNING AND 1 TABLET BY MOUTH EVERY EVENING 270 tablet 4  . repaglinide (PRANDIN) 2 MG tablet Take 1 tablet (2 mg total) by mouth 3 (three) times daily before meals. 270 tablet 3  . triamcinolone (KENALOG) 0.025 % ointment Apply to affected area three times a day as needed for rash 80 g 3   No current facility-administered medications on file prior to visit.    No Known Allergies  Family History  Problem Relation Age of Onset  . Diabetes Mother   . Diabetes Brother   . Cancer Neg Hx   . Colon cancer Neg Hx   . Esophageal cancer  Neg Hx   . Stomach cancer Neg Hx   . Rectal cancer Neg Hx     BP 134/62 mmHg  Pulse 92  Temp(Src) 98.1 F (36.7 C) (Oral)  Ht $R'5\' 7"'UJ$  (1.702 m)  Wt 156 lb (70.761 kg)  BMI 24.43 kg/m2  SpO2 97%  Review of Systems He denies hypoglycemia    Objective:   Physical Exam VITAL SIGNS:  See vs page GENERAL: no distress Pulses: dorsalis pedis intact bilat.   MSK: no deformity of the feet CV: no leg edema Skin:  no ulcer on the feet.  normal color and temp on the feet. Neuro: sensation is intact to touch on the feet.    Lab Results  Component Value Date   HGBA1C 6.9* 03/20/2015      Assessment & Plan:  DM: well-controlled  Patient is advised the following: Patient Instructions  Please continue the same medications. check your blood  sugar once a day.  vary the time of day when you check, between before the 3 meals, and at bedtime.  also check if you have symptoms of your blood sugar being too high or too low.  please keep a record of the readings and bring it to your next appointment here.  You can write it on any piece of paper.  please call us sooner if your blood sugar goes below 70, or if you have a lot of readings over 200. Please come back for a follow-up appointment in 4 months.

## 2015-03-22 NOTE — Patient Instructions (Addendum)
Please continue the same medications.   check your blood sugar once a day.  vary the time of day when you check, between before the 3 meals, and at bedtime.  also check if you have symptoms of your blood sugar being too high or too low.  please keep a record of the readings and bring it to your next appointment here.  You can write it on any piece of paper.  please call us sooner if your blood sugar goes below 70, or if you have a lot of readings over 200.   Please come back for a follow-up appointment in 4 months.    

## 2015-06-16 ENCOUNTER — Other Ambulatory Visit: Payer: Self-pay | Admitting: *Deleted

## 2015-06-16 MED ORDER — LISINOPRIL 5 MG PO TABS: 5.0000 mg | ORAL_TABLET | Freq: Every day | ORAL | Status: DC

## 2015-06-27 ENCOUNTER — Encounter: Payer: Self-pay | Admitting: Endocrinology

## 2015-06-27 LAB — HM DIABETES EYE EXAM

## 2015-07-24 ENCOUNTER — Encounter: Payer: Self-pay | Admitting: Endocrinology

## 2015-07-24 ENCOUNTER — Ambulatory Visit (INDEPENDENT_AMBULATORY_CARE_PROVIDER_SITE_OTHER): Payer: 59 | Admitting: Endocrinology

## 2015-07-24 VITALS — BP 136/70 | HR 102 | Temp 98.2°F | Ht 67.0 in | Wt 156.0 lb

## 2015-07-24 DIAGNOSIS — E119 Type 2 diabetes mellitus without complications: Secondary | ICD-10-CM

## 2015-07-24 DIAGNOSIS — Z23 Encounter for immunization: Secondary | ICD-10-CM | POA: Diagnosis not present

## 2015-07-24 DIAGNOSIS — Z0000 Encounter for general adult medical examination without abnormal findings: Secondary | ICD-10-CM | POA: Insufficient documentation

## 2015-07-24 DIAGNOSIS — Z119 Encounter for screening for infectious and parasitic diseases, unspecified: Secondary | ICD-10-CM

## 2015-07-24 LAB — POCT GLYCOSYLATED HEMOGLOBIN (HGB A1C): Hemoglobin A1C: 6.9

## 2015-07-24 NOTE — Progress Notes (Signed)
Subjective:    Patient ID: Elijah Cervantes, male    DOB: 10-Aug-1956, 59 y.o.   MRN: [id number]  HPI Pt returns for f/u of diabetes mellitus:  DM type: 2 Dx'ed: 5701 Complications: none Therapy: 6 oral meds DKA: never Severe hypoglycemia: never.   Pancreatitis: never.   Other: he has declined acarbose and welchol; he declines insulin.  Interval history: no cbg record, but states cbg's are well-controlled.  pt states he feels well in general.   Past Medical History  Diagnosis Date  . DIABETES MELLITUS, TYPE II 03/31/2007  . DEPRESSION 03/31/2007  . HYPERTENSION 03/31/2007  . GERD 07/26/2009  . FATTY LIVER DISEASE 07/18/2008  . 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 HYPERCHOLESTEROLEMIA 10/31/2010  . Dyspepsia   . NASH (nonalcoholic steatohepatitis)   . HSV-1 infection   . ABNORMAL ELECTROCARDIOGRAM 07/26/2009  . Personal history of colonic adenoma 02/10/2013    Past Surgical History  Procedure Laterality Date  . Stress cardiolite  11/22/2003  . Hernia repair      Social History   Social History  . Marital Status: Married    Spouse Name: N/A  . Number of Children: N/A  . Years of Education: N/A   Occupational History  . Unemployed    Social History Main Topics  . Smoking status: Never Smoker   . Smokeless tobacco: Never Used  . Alcohol Use: Yes     Comment: occasional use  . Drug Use: No  . Sexual Activity: Not on file   Other Topics Concern  . Not on file   Social History Narrative    Current Outpatient Prescriptions on File Prior to Visit  Medication Sig Dispense Refill  . aspirin 81 MG tablet Take 1 tablet (81 mg total) by mouth daily. 30 tablet 0  . atorvastatin (LIPITOR) 40 MG tablet TAKE 1 TABLET BY MOUTH DAILY 90 tablet 3  . bromocriptine (PARLODEL) 2.5 MG tablet Take 0.5 tablets (1.25 mg total) by mouth at bedtime. 45 tablet 3  . canagliflozin (INVOKANA) 100 MG TABS tablet Take 1 tablet (100 mg total) by mouth daily. 90 tablet 3  . JANUVIA  100 MG tablet Take 1 tablet (100 mg total) by mouth daily. 90 tablet 4  . Lancets (ONETOUCH ULTRASOFT) lancets USE TO TEST TWICE A DAY AS DIRECTED 100 each 6  . lisinopril (PRINIVIL,ZESTRIL) 5 MG tablet Take 1 tablet (5 mg total) by mouth daily. 90 tablet 0  . methocarbamol (ROBAXIN) 500 MG tablet TAKE 1 TABLET BY MOUTH AT BEDTIME FOR LEG CRAMPS 90 tablet 1  . ONE TOUCH ULTRA TEST test strip USE TO TEST BLOOD SUGAR TWICE A DAY AS DIRECTED 100 each 5  . pantoprazole (PROTONIX) 40 MG tablet TAKE 1 TABLET (40 MG TOTAL) BY MOUTH DAILY. 90 tablet 0  . pioglitazone-metformin (ACTOPLUS MET) 15-500 MG per tablet TAKE 2 TABLETS BY MOUTH EVERY MORNING AND 1 TABLET BY MOUTH EVERY EVENING 270 tablet 4  . repaglinide (PRANDIN) 2 MG tablet Take 1 tablet (2 mg total) by mouth 3 (three) times daily before meals. 270 tablet 3  . triamcinolone (KENALOG) 0.025 % ointment Apply to affected area three times a day as needed for rash 80 g 3   No current facility-administered medications on file prior to visit.    No Known Allergies  Family History  Problem Relation Age of Onset  . Diabetes Mother   . Diabetes Brother   . Cancer Neg Hx   . Colon cancer Neg Hx   .  Esophageal cancer Neg Hx   . Stomach cancer Neg Hx   . Rectal cancer Neg Hx     BP 136/70 mmHg  Pulse 102  Temp(Src) 98.2 F (36.8 C) (Oral)  Ht _0  (1.702 m)  Wt 156 lb (70.761 kg)  BMI 24.43 kg/m2  SpO2 98%  Review of Systems He has gained a few lbs.  He denies hypoglycemia.     Objective:   Physical Exam VITAL SIGNS:  See vs page GENERAL: no distress Pulses: dorsalis pedis intact bilat.   MSK: no deformity of the feet CV: no leg edema Skin:  no ulcer on the feet.  normal color and temp on the feet. Neuro: sensation is intact to touch on the feet   A1c=6.9%    Assessment & Plan:  DM: well-controlled  Patient is advised the following: Patient Instructions  Please continue the same medications.   check your blood sugar  once a day.  vary the time of day when you check, between before the 3 meals, and at bedtime.  also check if you have symptoms of your blood sugar being too high or too low.  please keep a record of the readings and bring it to your next appointment here.  You can write it on any piece of paper.  please call us sooner if your blood sugar goes below 70, or if you have a lot of readings over 200. Please come back for a regular physical appointment in 4 months.

## 2015-07-24 NOTE — Patient Instructions (Addendum)
Please continue the same medications.   check your blood sugar once a day.  vary the time of day when you check, between before the 3 meals, and at bedtime.  also check if you have symptoms of your blood sugar being too high or too low.  please keep a record of the readings and bring it to your next appointment here.  You can write it on any piece of paper.  please call us sooner if your blood sugar goes below 70, or if you have a lot of readings over 200. Please come back for a regular physical appointment in 4 months.

## 2015-07-24 NOTE — Addendum Note (Signed)
Addended by: Moody Bruins E on: 07/24/2015 09:33 AM   Modules accepted: Orders

## 2015-08-22 ENCOUNTER — Other Ambulatory Visit: Payer: Self-pay | Admitting: Endocrinology

## 2015-09-08 MED FILL — REPAGLINIDE 2 MG TABLET: 2 | 90 days supply | Qty: 270 | Fill #2

## 2015-09-08 MED FILL — LISINOPRIL 5 MG TABLET: 5 | 90 days supply | Qty: 90 | Fill #0

## 2015-09-11 MED FILL — PIOGLIT-METFORMIN 15-500: 15-500 | 90 days supply | Qty: 270 | Fill #2

## 2015-09-14 MED FILL — INVOKANA 100 MG TABLET: 100 | 90 days supply | Qty: 90 | Fill #3

## 2015-09-14 MED FILL — BROMOCRIPTINE 2.5 MG TABLET: 2.5 | 90 days supply | Qty: 45 | Fill #2

## 2015-09-14 MED FILL — ATORVASTATIN 40 MG TABLET: 40 | 90 days supply | Qty: 90 | Fill #2

## 2015-09-27 ENCOUNTER — Other Ambulatory Visit: Payer: Self-pay | Admitting: Endocrinology

## 2015-09-27 MED FILL — PANTOPRAZOLE SOD DR 40 MG T: 40 | 90 days supply | Qty: 90 | Fill #0

## 2015-11-19 NOTE — Progress Notes (Signed)
   Subjective:    Patient ID: Elijah Cervantes, male    DOB: 1956-08-26, 60 y.o.   MRN: [id number]  HPI Pt returns for f/u of diabetes mellitus:  DM type: 2 Dx'ed: 99991111 Complications: none Therapy: 6 oral meds DKA: never Severe hypoglycemia: never.   Pancreatitis: never.   Other: he has declined acarbose and welchol; he declines insulin.  Interval history: no cbg record, but states cbg's are well-controlled.  pt states he feels well in general.     Review of Systems     Objective:   Physical Exam        Assessment & Plan:   This encounter was created in error - please disregard.

## 2015-11-20 ENCOUNTER — Encounter: Payer: 59 | Admitting: Endocrinology

## 2015-11-21 ENCOUNTER — Ambulatory Visit: Payer: 59 | Admitting: Endocrinology

## 2015-11-21 ENCOUNTER — Telehealth: Payer: Self-pay | Admitting: Endocrinology

## 2015-11-21 NOTE — Telephone Encounter (Signed)
Patient no showed today's appt. Please advise on how to follow up. °A. No follow up necessary. °B. Follow up urgent. Contact patient immediately. °C. Follow up necessary. Contact patient and schedule visit in ___ days. °D. Follow up advised. Contact patient and schedule visit in ____weeks. ° °

## 2015-11-22 NOTE — Telephone Encounter (Signed)
Pt scheduled for 11/27/2015.

## 2015-11-22 NOTE — Telephone Encounter (Signed)
Please come back for a follow-up appointment in 2 months.    

## 2015-11-26 NOTE — Progress Notes (Signed)
Subjective:    Patient ID: Elijah Cervantes, male    DOB: 23-Aug-1956, 60 y.o.   MRN: [id number]  HPI Pt is here for regular wellness examination, and is feeling pretty well in general, and says chronic med probs are stable, except as noted below Past Medical History  Diagnosis Date  . DIABETES MELLITUS, TYPE II 03/31/2007  . DEPRESSION 03/31/2007  . HYPERTENSION 03/31/2007  . GERD 07/26/2009  . FATTY LIVER DISEASE 07/18/2008  . 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 HYPERCHOLESTEROLEMIA 10/31/2010  . Dyspepsia   . NASH (nonalcoholic steatohepatitis)   . HSV-1 infection   . ABNORMAL ELECTROCARDIOGRAM 07/26/2009  . Personal history of colonic adenoma 02/10/2013    Past Surgical History  Procedure Laterality Date  . Stress cardiolite  11/22/2003  . Hernia repair      Social History   Social History  . Marital Status: Married    Spouse Name: N/A  . Number of Children: N/A  . Years of Education: N/A   Occupational History  . Unemployed    Social History Main Topics  . Smoking status: Never Smoker   . Smokeless tobacco: Never Used  . Alcohol Use: Yes     Comment: occasional use  . Drug Use: No  . Sexual Activity: Not on file   Other Topics Concern  . Not on file   Social History Narrative    Current Outpatient Prescriptions on File Prior to Visit  Medication Sig Dispense Refill  . aspirin 81 MG tablet Take 1 tablet (81 mg total) by mouth daily. 30 tablet 0  . atorvastatin (LIPITOR) 40 MG tablet TAKE 1 TABLET BY MOUTH DAILY 90 tablet 3  . bromocriptine (PARLODEL) 2.5 MG tablet Take 0.5 tablets (1.25 mg total) by mouth at bedtime. 45 tablet 3  . canagliflozin (INVOKANA) 100 MG TABS tablet Take 1 tablet (100 mg total) by mouth daily. 90 tablet 3  . JANUVIA 100 MG tablet Take 1 tablet (100 mg total) by mouth daily. 90 tablet 4  . Lancets (ONETOUCH ULTRASOFT) lancets USE TO TEST TWICE A DAY AS DIRECTED 100 each 6  . lisinopril (PRINIVIL,ZESTRIL) 5 MG tablet TAKE 1  TABLET BY MOUTH ONCE DAILY 90 tablet 0  . methocarbamol (ROBAXIN) 500 MG tablet TAKE 1 TABLET BY MOUTH AT BEDTIME FOR LEG CRAMPS 90 tablet 1  . ONE TOUCH ULTRA TEST test strip USE TO TEST BLOOD SUGAR TWICE A DAY AS DIRECTED 100 each 5  . pantoprazole (PROTONIX) 40 MG tablet TAKE 1 TABLET BY MOUTH ONCE DAILY 90 tablet 0  . pioglitazone-metformin (ACTOPLUS MET) 15-500 MG per tablet TAKE 2 TABLETS BY MOUTH EVERY MORNING AND 1 TABLET BY MOUTH EVERY EVENING 270 tablet 4  . repaglinide (PRANDIN) 2 MG tablet Take 1 tablet (2 mg total) by mouth 3 (three) times daily before meals. 270 tablet 3  . triamcinolone (KENALOG) 0.025 % ointment Apply to affected area three times a day as needed for rash 80 g 3   No current facility-administered medications on file prior to visit.    No Known Allergies  Family History  Problem Relation Age of Onset  . Diabetes Mother   . Diabetes Brother   . Cancer Neg Hx   . Colon cancer Neg Hx   . Esophageal cancer Neg Hx   . Stomach cancer Neg Hx   . Rectal cancer Neg Hx     BP 137/70 mmHg  Pulse 84  Temp(Src) 98.4 F (36.9 C) (Oral)  Ht 5'  7" (1.702 m)  Wt 152 lb (68.947 kg)  BMI 23.80 kg/m2  SpO2 97%  Review of Systems  Constitutional: Negative for fever and unexpected weight change.  HENT: Negative for hearing loss.   Eyes: Negative for visual disturbance.  Respiratory: Negative for shortness of breath.   Cardiovascular: Negative for chest pain.  Gastrointestinal: Negative for anal bleeding.  Endocrine: Negative for cold intolerance.  Genitourinary: Negative for hematuria and difficulty urinating.  Musculoskeletal: Negative for back pain.  Skin: Negative for rash.  Allergic/Immunologic: Negative for environmental allergies.  Neurological: Negative for syncope and numbness.  Hematological: Does not bruise/bleed easily.  Psychiatric/Behavioral: Negative for dysphoric mood.       Objective:   Physical Exam VS: see vs page GEN: no  distress HEAD: head: no deformity eyes: no periorbital swelling, no proptosis external nose and ears are normal mouth: no lesion seen NECK: supple, thyroid is not enlarged CHEST WALL: no deformity LUNGS: clear to auscultation BREASTS:  No gynecomastia CV: reg rate and rhythm, no murmur ABD: abdomen is soft, nontender.  no hepatosplenomegaly.  not distended.  no hernia.     RECTAL/PROSTATE: declined MUSCULOSKELETAL: muscle bulk and strength are grossly normal.  no obvious joint swelling.  gait is normal and steady EXTEMITIES: no deformity.  no ulcer on the feet.  feet are of normal color and temp.  no edema PULSES: dorsalis pedis intact bilat.  no carotid bruit NEURO:  cn 2-12 grossly intact.   readily moves all 4's.  sensation is intact to touch on the feet SKIN:  Normal texture and temperature.  No rash or suspicious lesion is visible.   NODES:  None palpable at the neck PSYCH: alert, well-oriented.  Does not appear anxious nor depressed.  i personally reviewed electrocardiogram tracing (today):  Indication: DM Impression: normal    Assessment & Plan:  Wellness visit today, with problems stable, except as noted.   Patient is advised the following: Patient Instructions  please consider these measures for your health:  minimize alcohol.  do not use tobacco products.  have a colonoscopy at least every 10 years from age 70.  keep firearms safely stored.  always use seat belts.  have working smoke alarms in your home.  see an eye doctor and dentist regularly.  never drive under the influence of alcohol or drugs (including prescription drugs).   Please continue the same medications.  Please come back for a follow-up appointment in 5-6 months.

## 2015-11-27 ENCOUNTER — Encounter: Payer: Self-pay | Admitting: Endocrinology

## 2015-11-27 ENCOUNTER — Ambulatory Visit (INDEPENDENT_AMBULATORY_CARE_PROVIDER_SITE_OTHER): Payer: 59 | Admitting: Endocrinology

## 2015-11-27 VITALS — BP 137/70 | HR 84 | Temp 98.4°F | Ht 67.0 in | Wt 152.0 lb

## 2015-11-27 DIAGNOSIS — E119 Type 2 diabetes mellitus without complications: Secondary | ICD-10-CM | POA: Diagnosis not present

## 2015-11-27 LAB — POCT GLYCOSYLATED HEMOGLOBIN (HGB A1C): HEMOGLOBIN A1C: 6.9

## 2015-11-27 NOTE — Progress Notes (Signed)
we discussed code status.  pt requests full code, but would not want to be started or maintained on artificial life-support measures if there was not a reasonable chance of recovery 

## 2015-11-27 NOTE — Patient Instructions (Addendum)
please consider these measures for your health:  minimize alcohol.  do not use tobacco products.  have a colonoscopy at least every 10 years from age 60.  keep firearms safely stored.  always use seat belts.  have working smoke alarms in your home.  see an eye doctor and dentist regularly.  never drive under the influence of alcohol or drugs (including prescription drugs).   Please continue the same medications.  Please come back for a follow-up appointment in 5-6 months.

## 2015-12-04 ENCOUNTER — Other Ambulatory Visit: Payer: Self-pay | Admitting: Endocrinology

## 2015-12-04 MED FILL — PIOGLIT-METFORMIN 15-500: 15-500 | 90 days supply | Qty: 270 | Fill #3

## 2015-12-04 MED FILL — LISINOPRIL 5 MG TABLET: 5 | 90 days supply | Qty: 90 | Fill #0

## 2015-12-04 MED FILL — REPAGLINIDE 2 MG TABLET: 2 | 90 days supply | Qty: 270 | Fill #3

## 2015-12-07 ENCOUNTER — Other Ambulatory Visit: Payer: Self-pay | Admitting: Endocrinology

## 2015-12-07 MED FILL — INVOKANA 100 MG TABLET: 100 | 90 days supply | Qty: 90 | Fill #0

## 2015-12-07 MED FILL — BROMOCRIPTINE 2.5 MG TABLET: 2.5 | 90 days supply | Qty: 45 | Fill #0

## 2015-12-07 MED FILL — ATORVASTATIN 40 MG TABLET: 40 | 90 days supply | Qty: 90 | Fill #0

## 2015-12-12 ENCOUNTER — Other Ambulatory Visit: Payer: Self-pay | Admitting: Endocrinology

## 2015-12-12 MED FILL — JANUVIA 100 MG TABLET: 100 | 90 days supply | Qty: 90 | Fill #0

## 2015-12-20 ENCOUNTER — Other Ambulatory Visit: Payer: Self-pay | Admitting: Endocrinology

## 2015-12-20 MED FILL — ONE TOUCH ULTRA TEST STRIPS: 50 days supply | Qty: 100 | Fill #0

## 2015-12-20 MED FILL — METHOCARBAMOL 500 MG TABLET: 500 | 90 days supply | Qty: 90 | Fill #1

## 2015-12-20 MED FILL — ONE TOUCH ULTRASOFT LANCETS: 50 days supply | Qty: 100 | Fill #0

## 2016-01-11 DIAGNOSIS — H40012 Open angle with borderline findings, low risk, left eye: Secondary | ICD-10-CM | POA: Diagnosis not present

## 2016-01-11 DIAGNOSIS — H40011 Open angle with borderline findings, low risk, right eye: Secondary | ICD-10-CM | POA: Diagnosis not present

## 2016-03-04 ENCOUNTER — Other Ambulatory Visit: Payer: Self-pay | Admitting: Endocrinology

## 2016-03-04 MED FILL — INVOKANA 100 MG TABLET: 100 | 90 days supply | Qty: 90 | Fill #1

## 2016-03-04 MED FILL — JANUVIA 100 MG TABLET: 100 | 90 days supply | Qty: 90 | Fill #1

## 2016-03-04 MED FILL — LISINOPRIL 5 MG TABLET: 5 | 90 days supply | Qty: 90 | Fill #0

## 2016-03-04 MED FILL — REPAGLINIDE 2 MG TABLET: 2 | 90 days supply | Qty: 270 | Fill #0

## 2016-03-04 MED FILL — ATORVASTATIN 40 MG TABLET: 40 | 90 days supply | Qty: 90 | Fill #1

## 2016-03-06 MED FILL — PIOGLIT-METFORMIN 15-500: 15-500 | 90 days supply | Qty: 270 | Fill #4

## 2016-05-03 MED FILL — BROMOCRIPTINE 2.5 MG TABLET: 2.5 | 90 days supply | Qty: 45 | Fill #1

## 2016-05-27 ENCOUNTER — Other Ambulatory Visit: Payer: Self-pay | Admitting: Endocrinology

## 2016-05-27 ENCOUNTER — Encounter: Payer: Self-pay | Admitting: Endocrinology

## 2016-05-27 ENCOUNTER — Ambulatory Visit (INDEPENDENT_AMBULATORY_CARE_PROVIDER_SITE_OTHER): Payer: 59 | Admitting: Endocrinology

## 2016-05-27 VITALS — BP 126/66 | HR 79 | Ht 67.0 in | Wt 158.0 lb

## 2016-05-27 DIAGNOSIS — E119 Type 2 diabetes mellitus without complications: Secondary | ICD-10-CM | POA: Diagnosis not present

## 2016-05-27 DIAGNOSIS — Z23 Encounter for immunization: Secondary | ICD-10-CM | POA: Diagnosis not present

## 2016-05-27 LAB — POCT GLYCOSYLATED HEMOGLOBIN (HGB A1C): Hemoglobin A1C: 7

## 2016-05-27 MED FILL — LISINOPRIL 5 MG TABLET: 5 | 90 days supply | Qty: 90 | Fill #0

## 2016-05-27 MED FILL — JANUVIA 100 MG TABLET: 100 | 90 days supply | Qty: 90 | Fill #2

## 2016-05-27 MED FILL — REPAGLINIDE 2 MG TABLET: 2 | 90 days supply | Qty: 270 | Fill #1

## 2016-05-27 MED FILL — PANTOPRAZOLE SOD DR 40 MG T: 40 | 90 days supply | Qty: 90 | Fill #0

## 2016-05-27 MED FILL — INVOKANA 100 MG TABLET: 100 | 90 days supply | Qty: 90 | Fill #2

## 2016-05-27 NOTE — Patient Instructions (Addendum)
Please continue the same medications.   If you attend [facility]," you can get free medications and testing strips.   Please come back for a regular physical appointment in 6 months (must be after 11/26/16).

## 2016-05-27 NOTE — Progress Notes (Signed)
Subjective:    Patient ID: Elijah Cervantes, male    DOB: April 03, 1956, 60 y.o.   MRN: [id number]  HPI Pt returns for f/u of diabetes mellitus:  DM type: 2 Dx'ed: 1308 Complications: none Therapy: 6 oral meds DKA: never Severe hypoglycemia: never.   Pancreatitis: never.   Other: he has declined acarbose and welchol; he declines insulin; however, wife is Therapist, sports, so he could take in emergency if necessary.   Interval history: no cbg record, but states cbg's are well-controlled.  pt states he feels well in general.   Past Medical History:  Diagnosis Date  . ABNORMAL ELECTROCARDIOGRAM 07/26/2009  . DEPRESSION 03/31/2007  . DIABETES MELLITUS, TYPE II 03/31/2007  . Dyspepsia   . 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 FATTY LIVER DISEASE 07/18/2008  . GERD 07/26/2009  . HSV-1 infection   . HYPERCHOLESTEROLEMIA 10/31/2010  . HYPERTENSION 03/31/2007  . NASH (nonalcoholic steatohepatitis)   . Personal history of colonic adenoma 02/10/2013    Past Surgical History:  Procedure Laterality Date  . hernia repair    . Stress Cardiolite  11/22/2003    Social History   Social History  . Marital status: Married    Spouse name: N/A  . Number of children: N/A  . Years of education: N/A   Occupational History  . Unemployed    Social History Main Topics  . Smoking status: Never Smoker  . Smokeless tobacco: Never Used  . Alcohol use Yes     Comment: occasional use  . Drug use: No  . Sexual activity: Not on file   Other Topics Concern  . Not on file   Social History Narrative  . No narrative on file    Current Outpatient Prescriptions on File Prior to Visit  Medication Sig Dispense Refill  . aspirin 81 MG tablet Take 1 tablet (81 mg total) by mouth daily. 30 tablet 0  . atorvastatin (LIPITOR) 40 MG tablet TAKE 1 TABLET BY MOUTH ONCE DAILY 90 tablet 3  . bromocriptine (PARLODEL) 2.5 MG tablet TAKE 1/2 TABLET BY MOUTH AT BEDTIME 45 tablet 3  . INVOKANA 100 MG TABS tablet TAKE 1  TABLET BY MOUTH ONCE DAILY 90 tablet 3  . JANUVIA 100 MG tablet TAKE 1 TABLET BY MOUTH ONCE DAILY 90 tablet 4  . Lancets (ONETOUCH ULTRASOFT) lancets USE TO TEST TWICE A DAY AS DIRECTED 100 each 6  . methocarbamol (ROBAXIN) 500 MG tablet TAKE 1 TABLET BY MOUTH AT BEDTIME FOR LEG CRAMPS 90 tablet 1  . ONE TOUCH ULTRA TEST test strip USE TO TEST BLOOD SUGAR TWICE A DAY AS DIRECTED 100 each 5  . pioglitazone-metformin (ACTOPLUS MET) 15-500 MG per tablet TAKE 2 TABLETS BY MOUTH EVERY MORNING AND 1 TABLET BY MOUTH EVERY EVENING 270 tablet 4  . repaglinide (PRANDIN) 2 MG tablet Take 1 tablet (2 mg total) by mouth 3 (three) times daily before meals. 270 tablet 3  . triamcinolone (KENALOG) 0.025 % ointment Apply to affected area three times a day as needed for rash 80 g 3   No current facility-administered medications on file prior to visit.     No Known Allergies  Family History  Problem Relation Age of Onset  . Diabetes Mother   . Diabetes Brother   . Cancer Neg Hx   . Colon cancer Neg Hx   . Esophageal cancer Neg Hx   . Stomach cancer Neg Hx   . Rectal cancer Neg Hx     BP 126/66  Pulse 79   Ht _0  (1.702 m)   Wt 158 lb (71.7 kg)   SpO2 97%   BMI 24.75 kg/m   Review of Systems He denies hypoglycemia.      Objective:   Physical Exam VITAL SIGNS:  See vs page.  GENERAL: no distress.  Pulses: dorsalis pedis intact bilat.   MSK: no deformity of the feet.  CV: no leg edema.  Skin:  no ulcer on the feet.  normal color and temp on the feet.  Neuro: sensation is intact to touch on the feet.     A1c=7.0%    Assessment & Plan:  type 2 DM: well-controlled HTN: well-controlled.

## 2016-05-28 ENCOUNTER — Other Ambulatory Visit: Payer: Self-pay | Admitting: Endocrinology

## 2016-05-28 MED FILL — PIOGLIT-METFORMIN 15-500: 15-500 | 90 days supply | Qty: 270 | Fill #0

## 2016-07-10 DIAGNOSIS — H5212 Myopia, left eye: Secondary | ICD-10-CM | POA: Diagnosis not present

## 2016-07-10 DIAGNOSIS — H40012 Open angle with borderline findings, low risk, left eye: Secondary | ICD-10-CM | POA: Diagnosis not present

## 2016-07-10 DIAGNOSIS — H40011 Open angle with borderline findings, low risk, right eye: Secondary | ICD-10-CM | POA: Diagnosis not present

## 2016-07-10 LAB — HM DIABETES EYE EXAM

## 2016-07-15 ENCOUNTER — Encounter: Payer: Self-pay | Admitting: Endocrinology

## 2016-08-06 ENCOUNTER — Other Ambulatory Visit: Payer: Self-pay | Admitting: Endocrinology

## 2016-08-06 MED FILL — JANUVIA 100 MG TABLET: 100 | 90 days supply | Qty: 90 | Fill #3

## 2016-08-12 MED FILL — INVOKANA 100 MG TABLET: 100 | 90 days supply | Qty: 90 | Fill #3

## 2016-08-12 MED FILL — ATORVASTATIN 40 MG TABLET: 40 | 90 days supply | Qty: 90 | Fill #2

## 2016-08-12 MED FILL — PIOGLIT-METFORMIN 15-500: 15-500 | 90 days supply | Qty: 270 | Fill #1

## 2016-08-12 MED FILL — LISINOPRIL 5 MG TABLET: 5 | 90 days supply | Qty: 90 | Fill #0

## 2016-08-12 MED FILL — BROMOCRIPTINE 2.5 MG TABLET: 2.5 | 90 days supply | Qty: 45 | Fill #2

## 2016-08-12 MED FILL — REPAGLINIDE 2 MG TABLET: 2 | 90 days supply | Qty: 270 | Fill #2

## 2016-08-12 MED FILL — PANTOPRAZOLE SOD DR 40 MG T: 40 | 90 days supply | Qty: 90 | Fill #0

## 2016-09-17 MED FILL — AZITHROMYCIN 500 MG TABLET: 500 | 9 days supply | Qty: 9 | Fill #0

## 2016-09-21 ENCOUNTER — Telehealth: Payer: Self-pay | Admitting: Endocrinology

## 2016-09-21 NOTE — Telephone Encounter (Signed)
[facility] note dated 09/20/16 at 5:52: Pt needs refill on medication. [place] starting on 09/30/16. Has enough meds for 3 months and is wanting to see if they can get enough for 6 months.

## 2016-09-22 ENCOUNTER — Encounter: Payer: Self-pay | Admitting: Endocrinology

## 2016-09-23 ENCOUNTER — Other Ambulatory Visit: Payer: Self-pay

## 2016-09-23 MED ORDER — JANUVIA 100 MG PO TABS: 100.0000 mg | ORAL_TABLET | Freq: Every day | ORAL | 4 refills | Status: DC

## 2016-09-23 MED ORDER — ATORVASTATIN CALCIUM 40 MG PO TABS: 40.0000 mg | ORAL_TABLET | Freq: Every day | ORAL | 3 refills | Status: DC

## 2016-09-23 MED ORDER — LISINOPRIL 5 MG PO TABS: 5.0000 mg | ORAL_TABLET | Freq: Every day | ORAL | 0 refills | Status: DC

## 2016-09-23 MED ORDER — PIOGLITAZONE HCL-METFORMIN HCL 15-500 MG PO TABS: ORAL_TABLET | ORAL | 4 refills | Status: DC

## 2016-09-23 MED ORDER — PANTOPRAZOLE SODIUM 40 MG PO TBEC: 40.0000 mg | DELAYED_RELEASE_TABLET | Freq: Every day | ORAL | 0 refills | Status: DC

## 2016-09-23 MED ORDER — CANAGLIFLOZIN 100 MG PO TABS: 100.0000 mg | ORAL_TABLET | Freq: Every day | ORAL | 3 refills | Status: DC

## 2016-09-23 MED ORDER — BROMOCRIPTINE MESYLATE 2.5 MG PO TABS: 1.2500 mg | ORAL_TABLET | Freq: Every day | ORAL | 3 refills | Status: DC

## 2016-09-23 MED ORDER — REPAGLINIDE 2 MG PO TABS: 2.0000 mg | ORAL_TABLET | Freq: Three times a day (TID) | ORAL | 3 refills | Status: DC

## 2016-09-23 NOTE — Telephone Encounter (Signed)
Patient notified Rx's have been submitted to [facility] via [facility].

## 2016-09-24 MED FILL — BROMOCRIPTINE 2.5 MG TABLET: 2.5 | 90 days supply | Qty: 45 | Fill #0

## 2016-09-24 MED FILL — PIOGLIT-METFORMIN 15-500: 15-500 | 90 days supply | Qty: 270 | Fill #0

## 2016-09-24 MED FILL — LISINOPRIL 5 MG TABLET: 5 | 90 days supply | Qty: 90 | Fill #0

## 2016-09-24 MED FILL — REPAGLINIDE 2 MG TABLET: 2 | 90 days supply | Qty: 270 | Fill #0

## 2016-09-24 MED FILL — ATORVASTATIN 40 MG TABLET: 40 | 90 days supply | Qty: 90 | Fill #0

## 2016-09-24 MED FILL — INVOKANA 100 MG TABLET: 100 | 90 days supply | Qty: 90 | Fill #0

## 2016-09-24 MED FILL — PANTOPRAZOLE SOD DR 40 MG T: 40 | 90 days supply | Qty: 90 | Fill #0

## 2016-09-26 MED FILL — JANUVIA 100 MG TABLET: 100 | 90 days supply | Qty: 90 | Fill #0

## 2016-11-25 ENCOUNTER — Ambulatory Visit: Payer: 59 | Admitting: Endocrinology

## 2016-12-19 ENCOUNTER — Other Ambulatory Visit: Payer: Self-pay | Admitting: Endocrinology

## 2016-12-19 MED FILL — BROMOCRIPTINE 2.5 MG TABLET: 2.5 | 90 days supply | Qty: 45 | Fill #1

## 2016-12-19 MED FILL — LISINOPRIL 5 MG TABLET: 5 | 90 days supply | Qty: 90 | Fill #0

## 2016-12-19 MED FILL — PIOGLIT-METFORMIN 15-500: 15-500 | 90 days supply | Qty: 270 | Fill #1

## 2016-12-19 MED FILL — ATORVASTATIN 40 MG TABLET: 40 | 90 days supply | Qty: 90 | Fill #1

## 2016-12-19 MED FILL — INVOKANA 100 MG TABLET: 100 | 90 days supply | Qty: 90 | Fill #1

## 2016-12-19 MED FILL — REPAGLINIDE 2 MG TABLET: 2 | 90 days supply | Qty: 270 | Fill #1

## 2016-12-19 MED FILL — JANUVIA 100 MG TABLET: 100 | 90 days supply | Qty: 90 | Fill #1

## 2016-12-19 MED FILL — PANTOPRAZOLE SOD DR 40 MG T: 40 | 90 days supply | Qty: 90 | Fill #0

## 2017-03-17 ENCOUNTER — Other Ambulatory Visit: Payer: Self-pay | Admitting: Endocrinology

## 2017-03-17 MED FILL — JANUVIA 100 MG TABLET: 100 | 90 days supply | Qty: 90 | Fill #2

## 2017-03-17 MED FILL — PIOGLITAZONE-METFORMIN 15-5: 15-500 | 90 days supply | Qty: 270 | Fill #2

## 2017-03-17 MED FILL — REPAGLINIDE 2 MG TABLET: 2 | 90 days supply | Qty: 270 | Fill #2

## 2017-03-17 MED FILL — INVOKANA 100 MG TABLET: 100 | 90 days supply | Qty: 90 | Fill #2

## 2017-03-17 MED FILL — BROMOCRIPTINE 2.5 MG TABLET: 2.5 | 90 days supply | Qty: 45 | Fill #2

## 2017-03-17 MED FILL — ATORVASTATIN 40 MG TABLET: 40 | 90 days supply | Qty: 90 | Fill #2

## 2017-03-18 MED FILL — LISINOPRIL 5 MG TABLET: 5 | 90 days supply | Qty: 90 | Fill #0

## 2017-04-21 ENCOUNTER — Ambulatory Visit (INDEPENDENT_AMBULATORY_CARE_PROVIDER_SITE_OTHER): Payer: 59 | Admitting: Endocrinology

## 2017-04-21 ENCOUNTER — Encounter: Payer: Self-pay | Admitting: Endocrinology

## 2017-04-21 VITALS — BP 122/70 | HR 72 | Wt 155.6 lb

## 2017-04-21 DIAGNOSIS — Z0000 Encounter for general adult medical examination without abnormal findings: Secondary | ICD-10-CM

## 2017-04-21 DIAGNOSIS — M25511 Pain in right shoulder: Secondary | ICD-10-CM | POA: Diagnosis not present

## 2017-04-21 DIAGNOSIS — Z125 Encounter for screening for malignant neoplasm of prostate: Secondary | ICD-10-CM | POA: Diagnosis not present

## 2017-04-21 DIAGNOSIS — E119 Type 2 diabetes mellitus without complications: Secondary | ICD-10-CM

## 2017-04-21 DIAGNOSIS — Z119 Encounter for screening for infectious and parasitic diseases, unspecified: Secondary | ICD-10-CM | POA: Diagnosis not present

## 2017-04-21 DIAGNOSIS — Z23 Encounter for immunization: Secondary | ICD-10-CM

## 2017-04-21 LAB — POCT GLYCOSYLATED HEMOGLOBIN (HGB A1C): Hemoglobin A1C: 7.2

## 2017-04-21 MED ORDER — GLUCOSE BLOOD VI STRP: 1.0000 | ORAL_STRIP | Freq: Every day | 3 refills | Status: DC

## 2017-04-21 NOTE — Progress Notes (Signed)
Subjective:    Patient ID: Elijah Cervantes, male    DOB: 11/04/1955, 61 y.o.   MRN: [id number]  HPI Pt is here for regular wellness examination, and is feeling pretty well in general, and says chronic med probs are stable, except as noted below Past Medical History:  Diagnosis Date  . ABNORMAL ELECTROCARDIOGRAM 07/26/2009  . DEPRESSION 03/31/2007  . DIABETES MELLITUS, TYPE II 03/31/2007  . Dyspepsia   . 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 FATTY LIVER DISEASE 07/18/2008  . GERD 07/26/2009  . HSV-1 infection   . HYPERCHOLESTEROLEMIA 10/31/2010  . HYPERTENSION 03/31/2007  . NASH (nonalcoholic steatohepatitis)   . Personal history of colonic adenoma 02/10/2013    Past Surgical History:  Procedure Laterality Date  . hernia repair    . Stress Cardiolite  11/22/2003    Social History   Social History  . Marital status: Married    Spouse name: N/A  . Number of children: N/A  . Years of education: N/A   Occupational History  . Unemployed    Social History Main Topics  . Smoking status: Never Smoker  . Smokeless tobacco: Never Used  . Alcohol use Yes     Comment: occasional use  . Drug use: No  . Sexual activity: Not on file   Other Topics Concern  . Not on file   Social History Narrative  . No narrative on file    Current Outpatient Prescriptions on File Prior to Visit  Medication Sig Dispense Refill  . aspirin 81 MG tablet Take 1 tablet (81 mg total) by mouth daily. 30 tablet 0  . atorvastatin (LIPITOR) 40 MG tablet Take 1 tablet (40 mg total) by mouth daily. 90 tablet 3  . bromocriptine (PARLODEL) 2.5 MG tablet Take 0.5 tablets (1.25 mg total) by mouth at bedtime. 45 tablet 3  . canagliflozin (INVOKANA) 100 MG TABS tablet Take 1 tablet (100 mg total) by mouth daily. 90 tablet 3  . JANUVIA 100 MG tablet Take 1 tablet (100 mg total) by mouth daily. 90 tablet 4  . Lancets (ONETOUCH ULTRASOFT) lancets USE TO TEST TWICE A DAY AS DIRECTED 100 each 6  . lisinopril  (PRINIVIL,ZESTRIL) 5 MG tablet TAKE 1 TABLET BY MOUTH ONCE DAILY 90 tablet 0  . methocarbamol (ROBAXIN) 500 MG tablet TAKE 1 TABLET BY MOUTH AT BEDTIME FOR LEG CRAMPS 90 tablet 1  . pantoprazole (PROTONIX) 40 MG tablet TAKE 1 TABLET (40 MG TOTAL) BY MOUTH DAILY. 90 tablet 0  . pioglitazone-metformin (ACTOPLUS MET) 15-500 MG per tablet TAKE 2 TABLETS BY MOUTH EVERY MORNING AND 1 TABLET BY MOUTH EVERY EVENING 270 tablet 4  . pioglitazone-metformin (ACTOPLUS MET) 15-500 MG tablet TAKE 2 TABLETS BY MOUTH EVERY MORNING AND 1 TABLET BY MOUTH EVERY EVENING 270 tablet 4  . repaglinide (PRANDIN) 2 MG tablet Take 1 tablet (2 mg total) by mouth 3 (three) times daily before meals. 270 tablet 3  . triamcinolone (KENALOG) 0.025 % ointment Apply to affected area three times a day as needed for rash 80 g 3   No current facility-administered medications on file prior to visit.     No Known Allergies  Family History  Problem Relation Age of Onset  . Diabetes Mother   . Diabetes Brother   . Cancer Neg Hx   . Colon cancer Neg Hx   . Esophageal cancer Neg Hx   . Stomach cancer Neg Hx   . Rectal cancer Neg Hx     BP  122/70   Pulse 72   Wt 155 lb 9.6 oz (70.6 kg)   SpO2 95%   BMI 24.37 kg/m     Review of Systems  Constitutional: Negative for fever and unexpected weight change.  HENT: Negative for hearing loss.   Eyes: Negative for visual disturbance.  Respiratory: Negative for shortness of breath.   Cardiovascular: Negative for chest pain.  Gastrointestinal: Negative for anal bleeding.  Endocrine: Negative for cold intolerance.  Genitourinary: Negative for difficulty urinating and hematuria.  Musculoskeletal: Negative for back pain.  Allergic/Immunologic: Negative for environmental allergies.  Hematological: Does not bruise/bleed easily.  Psychiatric/Behavioral: Negative for dysphoric mood.       Objective:   Physical Exam VS: see vs page GEN: no distress HEAD: head: no deformity eyes:  no periorbital swelling, no proptosis external nose and ears are normal mouth: no lesion seen NECK: supple, thyroid is not enlarged CHEST WALL: no deformity LUNGS: clear to auscultation BREASTS:  No gynecomastia CV: reg rate and rhythm, no murmur ABD: abdomen is soft, nontender.  no hepatosplenomegaly.  not distended.  no hernia GENITALIA:  Normal male.   RECTAL: normal external and internal exam.  heme neg. PROSTATE:  Normal size.  No nodule MUSCULOSKELETAL: muscle bulk and strength are grossly normal.  no obvious joint swelling.  gait is normal and steady EXTEMITIES: no deformity.  no ulcer on the feet.  feet are of normal color and temp.  no edema PULSES: dorsalis pedis intact bilat.  no carotid bruit NEURO:  cn 2-12 grossly intact.   readily moves all 4's.  sensation is intact to touch on the feet SKIN:  Normal texture and temperature.  No rash or suspicious lesion is visible.   NODES:  None palpable at the neck PSYCH: alert, well-oriented.  Does not appear anxious nor depressed.   I personally reviewed electrocardiogram tracing (today): Indication: DM Impression: NSR.  No MI.  Voltage criteria for LVH   Abnormal repolarization.    Compared to 2017: no significant change      Assessment & Plan:  Wellness visit today, with problems stable, except as noted.   SEPARATE EVALUATION FOLLOWS--EACH PROBLEM HERE IS NEW, NOT RESPONDING TO TREATMENT, OR POSES SIGNIFICANT RISK TO THE PATIENT'S HEALTH: HISTORY OF THE PRESENT ILLNESS: Pt states few mod of moderate pain at the right shoulder, but no assoc numbness.  He had steroid injection there approx 10 years ago.   PAST MEDICAL HISTORY Past Medical History:  Diagnosis Date  . ABNORMAL ELECTROCARDIOGRAM 07/26/2009  . DEPRESSION 03/31/2007  . DIABETES MELLITUS, TYPE II 03/31/2007  . Dyspepsia   . 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 FATTY LIVER DISEASE 07/18/2008  . GERD 07/26/2009  . HSV-1 infection   . HYPERCHOLESTEROLEMIA  10/31/2010  . HYPERTENSION 03/31/2007  . NASH (nonalcoholic steatohepatitis)   . Personal history of colonic adenoma 02/10/2013    Past Surgical History:  Procedure Laterality Date  . hernia repair    . Stress Cardiolite  11/22/2003    Social History   Social History  . Marital status: Married    Spouse name: N/A  . Number of children: N/A  . Years of education: N/A   Occupational History  . Unemployed    Social History Main Topics  . Smoking status: Never Smoker  . Smokeless tobacco: Never Used  . Alcohol use Yes     Comment: occasional use  . Drug use: No  . Sexual activity: Not on file   Other Topics Concern  .  Not on file   Social History Narrative  . No narrative on file    Current Outpatient Prescriptions on File Prior to Visit  Medication Sig Dispense Refill  . aspirin 81 MG tablet Take 1 tablet (81 mg total) by mouth daily. 30 tablet 0  . atorvastatin (LIPITOR) 40 MG tablet Take 1 tablet (40 mg total) by mouth daily. 90 tablet 3  . bromocriptine (PARLODEL) 2.5 MG tablet Take 0.5 tablets (1.25 mg total) by mouth at bedtime. 45 tablet 3  . canagliflozin (INVOKANA) 100 MG TABS tablet Take 1 tablet (100 mg total) by mouth daily. 90 tablet 3  . JANUVIA 100 MG tablet Take 1 tablet (100 mg total) by mouth daily. 90 tablet 4  . Lancets (ONETOUCH ULTRASOFT) lancets USE TO TEST TWICE A DAY AS DIRECTED 100 each 6  . lisinopril (PRINIVIL,ZESTRIL) 5 MG tablet TAKE 1 TABLET BY MOUTH ONCE DAILY 90 tablet 0  . methocarbamol (ROBAXIN) 500 MG tablet TAKE 1 TABLET BY MOUTH AT BEDTIME FOR LEG CRAMPS 90 tablet 1  . pantoprazole (PROTONIX) 40 MG tablet TAKE 1 TABLET (40 MG TOTAL) BY MOUTH DAILY. 90 tablet 0  . pioglitazone-metformin (ACTOPLUS MET) 15-500 MG per tablet TAKE 2 TABLETS BY MOUTH EVERY MORNING AND 1 TABLET BY MOUTH EVERY EVENING 270 tablet 4  . pioglitazone-metformin (ACTOPLUS MET) 15-500 MG tablet TAKE 2 TABLETS BY MOUTH EVERY MORNING AND 1 TABLET BY MOUTH EVERY EVENING  270 tablet 4  . repaglinide (PRANDIN) 2 MG tablet Take 1 tablet (2 mg total) by mouth 3 (three) times daily before meals. 270 tablet 3  . triamcinolone (KENALOG) 0.025 % ointment Apply to affected area three times a day as needed for rash 80 g 3   No current facility-administered medications on file prior to visit.     No Known Allergies  Family History  Problem Relation Age of Onset  . Diabetes Mother   . Diabetes Brother   . Cancer Neg Hx   . Colon cancer Neg Hx   . Esophageal cancer Neg Hx   . Stomach cancer Neg Hx   . Rectal cancer Neg Hx     BP 122/70   Pulse 72   Wt 155 lb 9.6 oz (70.6 kg)   SpO2 95%   BMI 24.37 kg/m   REVIEW OF SYSTEMS: Denies rash.  PHYSICAL EXAMINATION: VITAL SIGNS:  See vs page GENERAL: no distress Right shoulder: nontender.  Full ROM, bot ROM is painful.   IMPRESSION: Shoulder pain, new PLAN:  Ref Dr Tamala Julian.

## 2017-04-21 NOTE — Patient Instructions (Signed)
blood tests are requested for you today.  We'll let you know about the results. Please see a specialist for the shoulder pain.  you will receive a phone call, about a day and time for an appointment. Please continue the same medications. Please come back for a follow-up appointment in 3-6 months. Please consider these measures for your health:  minimize alcohol.  Do not use tobacco products.  Have a colonoscopy at least every 10 years from age 61.  Keep firearms safely stored.  Always use seat belts.  have working smoke alarms in your home.  See an eye doctor and dentist regularly.  Never drive under the influence of alcohol or drugs (including prescription drugs).

## 2017-04-30 ENCOUNTER — Ambulatory Visit (INDEPENDENT_AMBULATORY_CARE_PROVIDER_SITE_OTHER): Payer: 59 | Admitting: Family Medicine

## 2017-04-30 ENCOUNTER — Encounter: Payer: Self-pay | Admitting: Family Medicine

## 2017-04-30 VITALS — BP 150/70 | Ht 66.0 in | Wt 155.0 lb

## 2017-04-30 DIAGNOSIS — M25511 Pain in right shoulder: Secondary | ICD-10-CM

## 2017-04-30 MED ORDER — NAPROXEN 500 MG PO TABS: ORAL_TABLET | ORAL | 1 refills | Status: DC

## 2017-04-30 MED FILL — NAPROXEN 500 MG TABLET: 500 | 30 days supply | Qty: 60 | Fill #0

## 2017-04-30 NOTE — Progress Notes (Signed)
   HPI 61 yo male presenting with right shoulder pain for the past 2 months.  Pain is intermittent and localized over the [facility] joint.  Describes pain as dull and aching in nature.  He sleeps on his right side although he tries not to, and therefore pain is worse in the morning upon awakening.  History of prior pain in this shoulder.  In the past has had a cortisone injection which initially helped but symptoms then returned.  He is currently using Tylenol as needed in addition to Salonpas patches which do provide some relief.  Has not tried applying ice or heating pads.  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  Denies numbness and tingling.  Denies swelling.   CC: R shoulder pain   Traumatic: No  Location: right AC joint  Quality: dull, aching  Duration: 2 months  Timing: nighttime and upon awakening as he sleeps on his R side  Improving/Worsening: no change  Makes better: as day progresses Makes worse: sleeping on shoulder  Associated symptoms: none   Previous Interventions Tried: Tylenol, PT, cortisone injection 9-10 years ago   ROS: Per HPI; in addition no fever, no rash, no additional weakness, no additional numbness, no additional paresthesias, and no additional falls/injury.   Objective: BP (!) 150/70   Ht 5\' 6"  (1.676 m)   Wt 155 lb (70.3 kg)   BMI 25.02 kg/m  Gen: right-hand Dominant. NAD, well groomed, a/o x3, normal affect.   R shoulder: Inspection reveals no abnormalities, atrophy or asymmetry.  Tender to palpation over AC joint.  No tenderness over bicipital groove. ROM is full in all planes.  Rotator cuff strength normal throughout.  No signs of impingement with negative Neer and Hawkin's tests, empty can.  Normal scapular function observed.  No painful arc and no drop arm sign.  No apprehension sign.   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  Exam without signs of impingement.   -order plain films AP, lateral, and outlet  -will contact patient with XR results  -Naproxen 500 mg BID x 3days -follow up prn     Orders Placed This Encounter  Procedures  . DG Shoulder Right    Standing Status:   Future    Standing Expiration Date:   06/30/2018    Order Specific Question:   Reason for Exam (SYMPTOM  OR DIAGNOSIS REQUIRED)    Answer:   right shoulder pain; views to be AP,LAT, OUTLET    Order Specific Question:   Preferred imaging location?    Answer:   [facility]    Order Specific Question:   Radiology Contrast Protocol - do NOT remove file path    Answer:   [email or web address]   Meds ordered this encounter  Medications  . naproxen (NAPROSYN) 500 MG tablet    Sig: Take 1 tablet 2 times a day, daily, for 5 days. Then take 1 tablet 2 times a day AS NEEDED after that. Take with food.    Dispense:  60 tablet    Refill:  1   Lovenia Kim, MD [place], PGY-2 05/01/2017 4:10 PM

## 2017-05-01 NOTE — Assessment & Plan Note (Addendum)
Right shoulder pain likely secondary to arthritis of AC joint given findings on exam.  Exam without signs of impingement.   -order plain films AP, lateral, and outlet  -will contact patient with XR results  -Naproxen 500 mg BID x 3days -follow up prn

## 2017-06-16 ENCOUNTER — Other Ambulatory Visit: Payer: Self-pay | Admitting: Endocrinology

## 2017-06-16 MED FILL — REPAGLINIDE 2 MG TABLET: 2 | 90 days supply | Qty: 270 | Fill #3

## 2017-06-16 MED FILL — ATORVASTATIN 40 MG TABLET: 40 | 90 days supply | Qty: 90 | Fill #3

## 2017-06-16 MED FILL — INVOKANA 100 MG TABLET: 100 | 90 days supply | Qty: 90 | Fill #3

## 2017-06-16 MED FILL — JANUVIA 100 MG TABLET: 100 | 90 days supply | Qty: 90 | Fill #3

## 2017-06-16 MED FILL — BROMOCRIPTINE 2.5 MG TABLET: 2.5 | 90 days supply | Qty: 45 | Fill #3

## 2017-06-16 MED FILL — PIOGLITAZONE-METFORMIN 15-5: 15-500 | 90 days supply | Qty: 270 | Fill #3

## 2017-06-17 MED FILL — LISINOPRIL 5 MG TAB: 5 | 90 days supply | Qty: 90 | Fill #0

## 2017-07-10 ENCOUNTER — Ambulatory Visit (INDEPENDENT_AMBULATORY_CARE_PROVIDER_SITE_OTHER): Payer: 59

## 2017-07-10 DIAGNOSIS — Z23 Encounter for immunization: Secondary | ICD-10-CM

## 2017-07-10 DIAGNOSIS — H40013 Open angle with borderline findings, low risk, bilateral: Secondary | ICD-10-CM | POA: Diagnosis not present

## 2017-07-10 DIAGNOSIS — H524 Presbyopia: Secondary | ICD-10-CM | POA: Diagnosis not present

## 2017-07-10 LAB — HM DIABETES EYE EXAM

## 2017-07-10 MED FILL — FREESTYLE LITE TEST STRIP: 90 days supply | Qty: 100 | Fill #0

## 2017-07-10 MED FILL — FREESTYLE LITE METER: 30 days supply | Qty: 1 | Fill #0

## 2017-07-10 MED FILL — FREESTYLE LANCETS: 90 days supply | Qty: 100 | Fill #0

## 2017-09-03 ENCOUNTER — Other Ambulatory Visit: Payer: Self-pay | Admitting: Endocrinology

## 2017-09-03 NOTE — Telephone Encounter (Signed)
Ok to refill 

## 2017-09-08 MED FILL — METHOCARBAMOL 500 MG TABS: 500 | 90 days supply | Qty: 90 | Fill #0

## 2017-09-11 ENCOUNTER — Telehealth: Payer: Self-pay

## 2017-09-12 ENCOUNTER — Telehealth: Payer: Self-pay | Admitting: Endocrinology

## 2017-09-12 MED FILL — PIOGLIT-METFORMIN 15-500: 15-500 | 90 days supply | Qty: 270 | Fill #4

## 2017-09-12 MED FILL — JANUVIA 100 MG TABLET: 100 | 60 days supply | Qty: 60 | Fill #4

## 2017-09-12 MED FILL — BROMOCRIPTINE 2.5 MG TABLET: 2.5 | 90 days supply | Qty: 45 | Fill #0

## 2017-09-12 MED FILL — LISINOPRIL 5 MG TAB: 5 | 90 days supply | Qty: 90 | Fill #0

## 2017-09-12 MED FILL — ATORVASTATIN 40 MG TABLET: 40 | 90 days supply | Qty: 90 | Fill #0

## 2017-09-12 MED FILL — REPAGLINIDE 2 MG TABLET: 2 | 90 days supply | Qty: 270 | Fill #0

## 2017-09-12 MED FILL — PANTOPRAZOLE SOD DR 40 MG T: 40 | 90 days supply | Qty: 90 | Fill #0

## 2017-09-12 NOTE — Telephone Encounter (Signed)
I left patient a VM to call back with name of medication. I also stated that I had not seen any paperwork from pharmacy or cover my meds for PA.

## 2017-09-12 NOTE — Telephone Encounter (Signed)
Patient need a PA for medication, please advise [phone]

## 2017-09-16 MED FILL — INVOKANA 100 MG TABLET: 100 | 90 days supply | Qty: 90 | Fill #0

## 2017-09-16 NOTE — Telephone Encounter (Signed)
Pt wife called back returning your call, Thank you!  Call wife @ [phone].

## 2017-09-17 ENCOUNTER — Other Ambulatory Visit: Payer: Self-pay

## 2017-09-17 NOTE — Telephone Encounter (Signed)
OK 

## 2017-09-17 NOTE — Telephone Encounter (Signed)
I called patient's wife & made lab appt for ordered labs 1/22 8:30 am.

## 2017-09-17 NOTE — Telephone Encounter (Signed)
I called and spoke with patient's wife & she wanted patient to come in to have labs before his appt. 2//20/19. I see where on his last visit that labs were ordered but not done. Should I make him a lab appt to have those labs completed that are already ordered?

## 2017-09-23 ENCOUNTER — Other Ambulatory Visit: Payer: Self-pay | Admitting: Endocrinology

## 2017-09-23 ENCOUNTER — Other Ambulatory Visit (INDEPENDENT_AMBULATORY_CARE_PROVIDER_SITE_OTHER): Payer: No Typology Code available for payment source

## 2017-09-23 DIAGNOSIS — K769 Liver disease, unspecified: Secondary | ICD-10-CM

## 2017-09-23 DIAGNOSIS — Z125 Encounter for screening for malignant neoplasm of prostate: Secondary | ICD-10-CM | POA: Diagnosis not present

## 2017-09-23 DIAGNOSIS — E119 Type 2 diabetes mellitus without complications: Secondary | ICD-10-CM | POA: Diagnosis not present

## 2017-09-23 DIAGNOSIS — M25511 Pain in right shoulder: Secondary | ICD-10-CM

## 2017-09-23 DIAGNOSIS — Z0000 Encounter for general adult medical examination without abnormal findings: Secondary | ICD-10-CM

## 2017-09-23 LAB — URINALYSIS, ROUTINE W REFLEX MICROSCOPIC
Bilirubin Urine: NEGATIVE
Ketones, ur: NEGATIVE
Leukocytes, UA: NEGATIVE
NITRITE: NEGATIVE
RBC / HPF: NONE SEEN (ref 0–?)
SPECIFIC GRAVITY, URINE: 1.015 (ref 1.000–1.030)
Total Protein, Urine: NEGATIVE
Urine Glucose: >=1000 — AB
Urobilinogen, UA: 0.2 (ref 0.0–1.0)
WBC, UA: NONE SEEN (ref 0–?)
pH: 5 (ref 5.0–8.0)

## 2017-09-23 LAB — LIPID PANEL
Cholesterol: 146 mg/dL (ref 0–200)
HDL: 44.3 mg/dL (ref >39.00)
LDL Cholesterol: 69 mg/dL (ref 0–99)
NonHDL: 101.87
TRIGLYCERIDES: 164 mg/dL — AB (ref 0.0–149.0)
Total CHOL/HDL Ratio: 3
VLDL: 32.8 mg/dL (ref 0.0–40.0)

## 2017-09-23 LAB — SEDIMENTATION RATE: Sed Rate: 47 mm/hr — ABNORMAL HIGH (ref 0–20)

## 2017-09-23 LAB — BASIC METABOLIC PANEL
BUN: 17 mg/dL (ref 6–23)
CHLORIDE: 100 meq/L (ref 96–112)
CO2: 29 meq/L (ref 19–32)
CREATININE: 1 mg/dL (ref 0.40–1.50)
Calcium: 9.6 mg/dL (ref 8.4–10.5)
GFR: 80.32 mL/min (ref >60.00)
GLUCOSE: 202 mg/dL — AB (ref 70–99)
Potassium: 4.2 mEq/L (ref 3.5–5.1)
Sodium: 138 mEq/L (ref 135–145)

## 2017-09-23 LAB — TSH: TSH: 1.66 u[IU]/mL (ref 0.35–4.50)

## 2017-09-23 LAB — HEPATIC FUNCTION PANEL
ALK PHOS: 65 U/L (ref 39–117)
ALT: 28 U/L (ref 0–53)
AST: 21 U/L (ref 0–37)
Albumin: 4.4 g/dL (ref 3.5–5.2)
Bilirubin, Direct: 0.1 mg/dL (ref 0.0–0.3)
TOTAL PROTEIN: 7 g/dL (ref 6.0–8.3)
Total Bilirubin: 0.7 mg/dL (ref 0.2–1.2)

## 2017-09-23 LAB — CBC WITH DIFFERENTIAL/PLATELET
Basophils Absolute: 0.1 10*3/uL (ref 0.0–0.1)
Basophils Relative: 1 % (ref 0.0–3.0)
EOS PCT: 2.5 % (ref 0.0–5.0)
Eosinophils Absolute: 0.1 10*3/uL (ref 0.0–0.7)
HEMATOCRIT: 46.8 % (ref 39.0–52.0)
HEMOGLOBIN: 15.5 g/dL (ref 13.0–17.0)
LYMPHS PCT: 37.2 % (ref 12.0–46.0)
Lymphs Abs: 2.1 10*3/uL (ref 0.7–4.0)
MCHC: 33.1 g/dL (ref 30.0–36.0)
MCV: 94.8 fl (ref 78.0–100.0)
MONOS PCT: 4.8 % (ref 3.0–12.0)
Monocytes Absolute: 0.3 10*3/uL (ref 0.1–1.0)
Neutro Abs: 3 10*3/uL (ref 1.4–7.7)
Neutrophils Relative %: 54.5 % (ref 43.0–77.0)
Platelets: 207 10*3/uL (ref 150.0–400.0)
RBC: 4.94 Mil/uL (ref 4.22–5.81)
RDW: 13 % (ref 11.5–15.5)
WBC: 5.6 10*3/uL (ref 4.0–10.5)

## 2017-09-23 LAB — URIC ACID: URIC ACID, SERUM: 6 mg/dL (ref 4.0–7.8)

## 2017-09-23 LAB — MICROALBUMIN / CREATININE URINE RATIO
CREATININE, U: 57.2 mg/dL
MICROALB/CREAT RATIO: 6.2 mg/g (ref 0.0–30.0)
Microalb, Ur: 3.5 mg/dL — ABNORMAL HIGH (ref 0.0–1.9)

## 2017-09-23 LAB — PSA: PSA: 1.68 ng/mL (ref 0.10–4.00)

## 2017-09-26 LAB — HEPATITIS C ANTIBODY
HEP C AB: NONREACTIVE
SIGNAL TO CUT-OFF: 0.02 (ref <1.00)

## 2017-10-01 ENCOUNTER — Other Ambulatory Visit: Payer: Self-pay

## 2017-10-01 MED ORDER — EMPAGLIFLOZIN 10 MG PO TABS: 10.0000 mg | ORAL_TABLET | Freq: Every day | ORAL | 3 refills | Status: DC

## 2017-10-01 MED ORDER — EMPAGLIFLOZIN 10 MG PO TABS: 10.0000 mg | ORAL_TABLET | Freq: Every day | ORAL | 4 refills | Status: DC

## 2017-10-01 NOTE — Telephone Encounter (Signed)
Spoke with kendal at [facility] and let her know patient will now be on jardiance instead of invokana because of insurance coverage

## 2017-10-01 NOTE — Telephone Encounter (Signed)
[place] [facility] pharmacy called and stated the Dr sent over script for  empagliflozin (JARDIANCE) 10 MG TABS tablet  And would like to know if the patient is coming off the  [facility] 100 MG TABS tablet     Please advise [phone]

## 2017-10-22 ENCOUNTER — Ambulatory Visit (INDEPENDENT_AMBULATORY_CARE_PROVIDER_SITE_OTHER): Payer: No Typology Code available for payment source | Admitting: Endocrinology

## 2017-10-22 VITALS — BP 158/78 | HR 93

## 2017-10-22 DIAGNOSIS — E119 Type 2 diabetes mellitus without complications: Secondary | ICD-10-CM

## 2017-10-22 LAB — POCT GLYCOSYLATED HEMOGLOBIN (HGB A1C): Hemoglobin A1C: 8.2

## 2017-10-22 MED ORDER — REPAGLINIDE 2 MG PO TABS: 4.0000 mg | ORAL_TABLET | Freq: Three times a day (TID) | ORAL | 3 refills | Status: DC

## 2017-10-22 MED ORDER — BROMOCRIPTINE MESYLATE 2.5 MG PO TABS: 2.5000 mg | ORAL_TABLET | Freq: Every day | ORAL | 3 refills | Status: DC

## 2017-10-22 NOTE — Patient Instructions (Addendum)
I have sent a prescription to your pharmacy, to double the bromocriptine and repaglinide. check your blood sugar once a day.  vary the time of day when you check, between before the 3 meals, and at bedtime.  also check if you have symptoms of your blood sugar being too high or too low.  please keep a record of the readings and bring it to your next appointment here (or you can bring the meter itself).  You can write it on any piece of paper.  please call us sooner if your blood sugar goes below 70, or if you have a lot of readings over 200.   Please come back for a follow-up appointment in 3 months.

## 2017-10-22 NOTE — Progress Notes (Signed)
Subjective:    Patient ID: Elijah Cervantes, male    DOB: October 13, 1955, 62 y.o.   MRN: [id number]  HPI Pt returns for f/u of diabetes mellitus:  DM type: 2 Dx'ed: 7564 Complications: none Therapy: 6 oral meds DKA: never Severe hypoglycemia: never.   Pancreatitis: never.   Other: he has declined acarbose and welchol; he declines insulin; however, wife is Therapist, sports, so he could take in emergency if necessary.   Interval history: no cbg record, but states cbg's are well-controlled.  pt states he feels well in general.  Past Medical History:  Diagnosis Date  . ABNORMAL ELECTROCARDIOGRAM 07/26/2009  . DEPRESSION 03/31/2007  . DIABETES MELLITUS, TYPE II 03/31/2007  . Dyspepsia   . 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 FATTY LIVER DISEASE 07/18/2008  . GERD 07/26/2009  . HSV-1 infection   . HYPERCHOLESTEROLEMIA 10/31/2010  . HYPERTENSION 03/31/2007  . NASH (nonalcoholic steatohepatitis)   . Personal history of colonic adenoma 02/10/2013    Past Surgical History:  Procedure Laterality Date  . hernia repair    . Stress Cardiolite  11/22/2003    Social History   Socioeconomic History  . Marital status: Married    Spouse name: Not on file  . Number of children: Not on file  . Years of education: Not on file  . Highest education level: Not on file  Social Needs  . Financial resource strain: Not on file  . Food insecurity - worry: Not on file  . Food insecurity - inability: Not on file  . Transportation needs - medical: Not on file  . Transportation needs - non-medical: Not on file  Occupational History  . Occupation: Unemployed  Tobacco Use  . Smoking status: Never Smoker  . Smokeless tobacco: Never Used  Substance and Sexual Activity  . Alcohol use: Yes    Comment: occasional use  . Drug use: No  . Sexual activity: Not on file  Other Topics Concern  . Not on file  Social History Narrative  . Not on file    Current Outpatient Medications on File Prior to Visit    Medication Sig Dispense Refill  . aspirin 81 MG tablet Take 1 tablet (81 mg total) by mouth daily. 30 tablet 0  . atorvastatin (LIPITOR) 40 MG tablet TAKE 1 TABLET BY MOUTH DAILY. 90 tablet 3  . glucose blood (ONETOUCH VERIO) test strip 1 each by Other route daily. And lancets 1/day 100 each 3  . INVOKANA 100 MG TABS tablet TAKE 1 TABLET (100 MG TOTAL) BY MOUTH DAILY. 90 tablet 3  . JANUVIA 100 MG tablet Take 1 tablet (100 mg total) by mouth daily. 90 tablet 4  . Lancets (ONETOUCH ULTRASOFT) lancets USE TO TEST TWICE A DAY AS DIRECTED 100 each 6  . lisinopril (PRINIVIL,ZESTRIL) 5 MG tablet TAKE 1 TABLET BY MOUTH ONCE DAILY 90 tablet 0  . methocarbamol (ROBAXIN) 500 MG tablet TAKE 1 TABLET BY MOUTH AT BEDTIME FOR LEG CRAMPS 90 tablet 1  . naproxen (NAPROSYN) 500 MG tablet Take 1 tablet 2 times a day, daily, for 5 days. Then take 1 tablet 2 times a day AS NEEDED after that. Take with food. 60 tablet 1  . pantoprazole (PROTONIX) 40 MG tablet TAKE 1 TABLET BY MOUTH ONCE DAILY 90 tablet 0  . pioglitazone-metformin (ACTOPLUS MET) 15-500 MG per tablet TAKE 2 TABLETS BY MOUTH EVERY MORNING AND 1 TABLET BY MOUTH EVERY EVENING 270 tablet 4  . triamcinolone (KENALOG) 0.025 % ointment Apply to  affected area three times a day as needed for rash 80 g 3   No current facility-administered medications on file prior to visit.     No Known Allergies  Family History  Problem Relation Age of Onset  . Diabetes Mother   . Diabetes Brother   . Cancer Neg Hx   . Colon cancer Neg Hx   . Esophageal cancer Neg Hx   . Stomach cancer Neg Hx   . Rectal cancer Neg Hx     BP (!) 158/78 (BP Location: Left Arm, Patient Position: Sitting, Cuff Size: Normal)   Pulse 93    Review of Systems He denies hypoglycemia    Objective:   Physical Exam VITAL SIGNS:  See vs page GENERAL: no distress Pulses: dorsalis pedis intact bilat.   MSK: no deformity of the feet CV: no leg edema Skin:  no ulcer on the feet.   normal color and temp on the feet.  Neuro: sensation is intact to touch on the feet.   A1c=8.2%     Assessment & Plan:  Type 2 DM: worse. HTN: we'll recheck next time, as increasing bromocriptine might help.  Patient Instructions  I have sent a prescription to your pharmacy, to double the bromocriptine and repaglinide. check your blood sugar once a day.  vary the time of day when you check, between before the 3 meals, and at bedtime.  also check if you have symptoms of your blood sugar being too high or too low.  please keep a record of the readings and bring it to your next appointment here (or you can bring the meter itself).  You can write it on any piece of paper.  please call us sooner if your blood sugar goes below 70, or if you have a lot of readings over 200.   Please come back for a follow-up appointment in 3 months.

## 2017-12-09 ENCOUNTER — Other Ambulatory Visit: Payer: Self-pay | Admitting: Endocrinology

## 2017-12-09 MED FILL — REPAGLINIDE 2 MG TABLET: 2 | 90 days supply | Qty: 270 | Fill #1

## 2017-12-09 MED FILL — PIOGLIT-METFORMIN 15-500: 15-500 | 90 days supply | Qty: 270 | Fill #0

## 2017-12-09 MED FILL — ATORVASTATIN 40 MG TABLET: 40 | 90 days supply | Qty: 90 | Fill #1

## 2017-12-09 MED FILL — PANTOPRAZOLE SOD DR 40 MG T: 40 | 90 days supply | Qty: 90 | Fill #0

## 2017-12-09 MED FILL — JANUVIA 100 MG TABLET: 100 | 90 days supply | Qty: 90 | Fill #0

## 2017-12-09 MED FILL — LISINOPRIL 5 MG TABLET: 5 | 90 days supply | Qty: 90 | Fill #0

## 2017-12-11 MED FILL — BROMOCRIPTINE 2.5 MG TABLET: 2.5 | 90 days supply | Qty: 90 | Fill #0

## 2017-12-12 ENCOUNTER — Telehealth: Payer: Self-pay | Admitting: Endocrinology

## 2017-12-12 ENCOUNTER — Other Ambulatory Visit: Payer: Self-pay

## 2017-12-12 MED ORDER — REPAGLINIDE 2 MG PO TABS: 4.0000 mg | ORAL_TABLET | Freq: Three times a day (TID) | ORAL | 3 refills | Status: DC

## 2017-12-12 MED ORDER — CANAGLIFLOZIN 100 MG PO TABS: ORAL_TABLET | ORAL | 3 refills | Status: DC

## 2017-12-12 MED FILL — INVOKANA 100 MG TABLET: 100 | 90 days supply | Qty: 90 | Fill #0

## 2017-12-12 NOTE — Telephone Encounter (Signed)
Jardiance was sent over for the patient. He picked up the medication but will not take it, He told the pharmacy he wants to go back to talking the  [facility] 100 MG TABS tablet   [place], [place]     repaglinide (PRANDIN) 2 MG tablet 270 tablets were sent in for patient, he takes 6 a day instead of 3.  Pharmacy would like 540 tablets sent in for patient. (90 day supply)

## 2017-12-12 NOTE — Telephone Encounter (Signed)
Ok to change jardiance to [employer]

## 2017-12-12 NOTE — Telephone Encounter (Signed)
I have resent prescription for regaglinide & I am resending prescription for invokana. It is still on patient's current med list. Should he be taking this as well?

## 2017-12-16 ENCOUNTER — Other Ambulatory Visit: Payer: Self-pay

## 2017-12-16 NOTE — Telephone Encounter (Signed)
Ok prescription was sent on Friday. I LVM for patient stating that it was ok to d/c jardiance & I have sent [facility]ana to [facility].

## 2018-01-21 ENCOUNTER — Telehealth: Payer: Self-pay | Admitting: Endocrinology

## 2018-01-21 ENCOUNTER — Ambulatory Visit (INDEPENDENT_AMBULATORY_CARE_PROVIDER_SITE_OTHER): Payer: No Typology Code available for payment source | Admitting: Endocrinology

## 2018-01-21 ENCOUNTER — Encounter: Payer: Self-pay | Admitting: Endocrinology

## 2018-01-21 VITALS — BP 154/78 | HR 84 | Ht 66.0 in | Wt 155.0 lb

## 2018-01-21 DIAGNOSIS — E119 Type 2 diabetes mellitus without complications: Secondary | ICD-10-CM | POA: Diagnosis not present

## 2018-01-21 DIAGNOSIS — Z860100 Personal history of colon polyps, unspecified: Secondary | ICD-10-CM

## 2018-01-21 LAB — POCT GLYCOSYLATED HEMOGLOBIN (HGB A1C): HEMOGLOBIN A1C: 6.6 % — AB (ref 4.0–5.6)

## 2018-01-21 MED ORDER — BROMOCRIPTINE MESYLATE 2.5 MG PO TABS: 2.5000 mg | ORAL_TABLET | Freq: Every day | ORAL | 3 refills | Status: DC

## 2018-01-21 MED ORDER — GLUCOSE BLOOD VI STRP: 1.0000 | ORAL_STRIP | Freq: Every day | 3 refills | Status: DC

## 2018-01-21 MED ORDER — CANAGLIFLOZIN 100 MG PO TABS: ORAL_TABLET | ORAL | 3 refills | Status: DC

## 2018-01-21 MED ORDER — PIOGLITAZONE HCL-METFORMIN HCL 15-500 MG PO TABS: ORAL_TABLET | ORAL | 4 refills | Status: DC

## 2018-01-21 MED ORDER — REPAGLINIDE 2 MG PO TABS: 4.0000 mg | ORAL_TABLET | Freq: Three times a day (TID) | ORAL | 3 refills | Status: DC

## 2018-01-21 NOTE — Telephone Encounter (Signed)
Pharmacy called in regards to patient [facility].  Insurance does not cover this and would like the FREESTYLE meter, test strips and lancets sent into the pharmacy    [place], [place] - [address] [facility].

## 2018-01-21 NOTE — Progress Notes (Signed)
Subjective:    Patient ID: Elijah Cervantes, male    DOB: 01/27/1956, 62 y.o.   MRN: [id number]  HPI Pt returns for f/u of diabetes mellitus:  DM type: 2 Dx'ed: 6599 Complications: none Therapy: 6 oral meds DKA: never Severe hypoglycemia: never.   Pancreatitis: never.   Other: he has declined acarbose and welchol; he declines insulin; however, wife is Therapist, sports, so he could take in emergency if necessary.   Interval history: no cbg record, but states cbg's are well-controlled.  pt states he feels well in general.  He ran out of bromocriptine.   Past Medical History:  Diagnosis Date  . ABNORMAL ELECTROCARDIOGRAM 07/26/2009  . DEPRESSION 03/31/2007  . DIABETES MELLITUS, TYPE II 03/31/2007  . Dyspepsia   . 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 FATTY LIVER DISEASE 07/18/2008  . GERD 07/26/2009  . HSV-1 infection   . HYPERCHOLESTEROLEMIA 10/31/2010  . HYPERTENSION 03/31/2007  . NASH (nonalcoholic steatohepatitis)   . Personal history of colonic adenoma 02/10/2013    Past Surgical History:  Procedure Laterality Date  . hernia repair    . Stress Cardiolite  11/22/2003    Social History   Socioeconomic History  . Marital status: Married    Spouse name: Not on file  . Number of children: Not on file  . Years of education: Not on file  . Highest education level: Not on file  Occupational History  . Occupation: Unemployed  Social Needs  . Financial resource strain: Not on file  . Food insecurity:    Worry: Not on file    Inability: Not on file  . Transportation needs:    Medical: Not on file    Non-medical: Not on file  Tobacco Use  . Smoking status: Never Smoker  . Smokeless tobacco: Never Used  Substance and Sexual Activity  . Alcohol use: Yes    Comment: occasional use  . Drug use: No  . Sexual activity: Not on file  Lifestyle  . Physical activity:    Days per week: Not on file    Minutes per session: Not on file  . Stress: Not on file  Relationships  . Social  connections:    Talks on phone: Not on file    Gets together: Not on file    Attends religious service: Not on file    Active member of club or organization: Not on file    Attends meetings of clubs or organizations: Not on file    Relationship status: Not on file  . Intimate partner violence:    Fear of current or ex partner: Not on file    Emotionally abused: Not on file    Physically abused: Not on file    Forced sexual activity: Not on file  Other Topics Concern  . Not on file  Social History Narrative  . Not on file    Current Outpatient Medications on File Prior to Visit  Medication Sig Dispense Refill  . aspirin 81 MG tablet Take 1 tablet (81 mg total) by mouth daily. 30 tablet 0  . atorvastatin (LIPITOR) 40 MG tablet TAKE 1 TABLET BY MOUTH DAILY. 90 tablet 3  . JANUVIA 100 MG tablet TAKE 1 TABLET BY MOUTH ONCE DAILY 90 tablet 4  . lisinopril (PRINIVIL,ZESTRIL) 5 MG tablet TAKE 1 TABLET BY MOUTH ONCE DAILY 90 tablet 0  . methocarbamol (ROBAXIN) 500 MG tablet TAKE 1 TABLET BY MOUTH AT BEDTIME FOR LEG CRAMPS 90 tablet 1  . pantoprazole (PROTONIX)  40 MG tablet TAKE 1 TABLET BY MOUTH ONCE DAILY 90 tablet 0  . triamcinolone (KENALOG) 0.025 % ointment Apply to affected area three times a day as needed for rash 80 g 3   No current facility-administered medications on file prior to visit.     No Known Allergies  Family History  Problem Relation Age of Onset  . Diabetes Mother   . Diabetes Brother   . Cancer Neg Hx   . Colon cancer Neg Hx   . Esophageal cancer Neg Hx   . Stomach cancer Neg Hx   . Rectal cancer Neg Hx     BP (!) 154/78   Pulse 84   Ht 5\' 6"  (1.676 m)   Wt 155 lb (70.3 kg)   SpO2 94%   BMI 25.02 kg/m    Review of Systems He denies hypoglycemia.      Objective:   Physical Exam VITAL SIGNS:  See vs page GENERAL: no distress Pulses: dorsalis pedis intact bilat.   MSK: no deformity of the feet CV: no leg edema Skin:  no ulcer on the feet.   normal color and temp on the feet. Neuro: sensation is intact to touch on the feet.    Lab Results  Component Value Date   HGBA1C 6.6 (A) 01/21/2018       Assessment & Plan:  Type 2 DM: well-controlled HTN: with prob situational component: recheck next time.  Patient Instructions  Please continue the same medications check your blood sugar once a day.  vary the time of day when you check, between before the 3 meals, and at bedtime.  also check if you have symptoms of your blood sugar being too high or too low.  please keep a record of the readings and bring it to your next appointment here (or you can bring the meter itself).  You can write it on any piece of paper.  please call us sooner if your blood sugar goes below 70, or if you have a lot of readings over 200.   Please come back for a follow-up appointment in 3 months (after 04/21/18).

## 2018-01-21 NOTE — Patient Instructions (Addendum)
Please continue the same medications check your blood sugar once a day.  vary the time of day when you check, between before the 3 meals, and at bedtime.  also check if you have symptoms of your blood sugar being too high or too low.  please keep a record of the readings and bring it to your next appointment here (or you can bring the meter itself).  You can write it on any piece of paper.  please call us sooner if your blood sugar goes below 70, or if you have a lot of readings over 200.   Please come back for a follow-up appointment in 3 months (after 04/21/18).

## 2018-01-22 ENCOUNTER — Other Ambulatory Visit: Payer: Self-pay

## 2018-01-22 MED ORDER — FREESTYLE SYSTEM KIT: 1.0000 | PACK | 0 refills | Status: DC | PRN

## 2018-01-22 MED ORDER — FREESTYLE LANCETS MISC: 3 refills | Status: DC

## 2018-01-22 MED ORDER — GLUCOSE BLOOD VI STRP: ORAL_STRIP | 3 refills | Status: DC

## 2018-01-22 MED FILL — FREESTYLE LANCETS: 90 days supply | Qty: 100 | Fill #0

## 2018-01-22 MED FILL — FREESTYLE LITE TEST STRIP: 90 days supply | Qty: 100 | Fill #0

## 2018-01-22 MED FILL — REPAGLINIDE 2 MG TABLET: 2 | 90 days supply | Qty: 540 | Fill #0

## 2018-01-22 NOTE — Telephone Encounter (Signed)
This has been done.

## 2018-02-09 ENCOUNTER — Encounter: Payer: Self-pay | Admitting: Internal Medicine

## 2018-03-17 ENCOUNTER — Other Ambulatory Visit: Payer: Self-pay | Admitting: Endocrinology

## 2018-03-17 MED FILL — JANUVIA 100 MG TABLET: 100 | 60 days supply | Qty: 60 | Fill #1

## 2018-03-17 MED FILL — INVOKANA 100 MG TABLET: 100 | 90 days supply | Qty: 90 | Fill #1

## 2018-03-17 MED FILL — ATORVASTATIN 40 MG TABLET: 40 | 90 days supply | Qty: 90 | Fill #2

## 2018-03-17 MED FILL — PIOGLIT-METFORMIN 15-500: 15-500 | 90 days supply | Qty: 270 | Fill #1

## 2018-03-18 MED FILL — LISINOPRIL 5 MG TABLET: 5 | 90 days supply | Qty: 90 | Fill #0

## 2018-03-18 MED FILL — BROMOCRIPTINE 2.5 MG TABLET: 2.5 | 90 days supply | Qty: 90 | Fill #1

## 2018-04-14 ENCOUNTER — Ambulatory Visit (AMBULATORY_SURGERY_CENTER): Payer: Self-pay | Admitting: *Deleted

## 2018-04-14 VITALS — Ht 62.5 in | Wt 153.4 lb

## 2018-04-14 DIAGNOSIS — Z86010 Personal history of colonic polyps: Secondary | ICD-10-CM

## 2018-04-14 MED ORDER — NA SULFATE-K SULFATE-MG SULF 17.5-3.13-1.6 GM/177ML PO SOLN: 1.0000 | Freq: Once | ORAL | 0 refills | Status: AC

## 2018-04-14 MED FILL — SUPREP BOWEL PREP KIT: 17.5-3.13-1 | 1 days supply | Qty: 354 | Fill #0

## 2018-04-14 NOTE — Progress Notes (Signed)
No egg or soy allergy known to patient  No issues with past sedation with any surgeries  or procedures, no intubation problems  No diet pills per patient No home 02 use per patient  No blood thinners per patient  Pt denies issues with constipation  No A fib or A flutter  EMMI video sent to pt's e mail pt declined   

## 2018-04-20 ENCOUNTER — Encounter: Payer: Self-pay | Admitting: Internal Medicine

## 2018-04-22 ENCOUNTER — Ambulatory Visit (INDEPENDENT_AMBULATORY_CARE_PROVIDER_SITE_OTHER): Payer: No Typology Code available for payment source | Admitting: Endocrinology

## 2018-04-22 ENCOUNTER — Encounter: Payer: Self-pay | Admitting: Endocrinology

## 2018-04-22 VITALS — BP 140/60 | HR 75 | Temp 98.5°F | Ht 62.5 in | Wt 151.6 lb

## 2018-04-22 DIAGNOSIS — Z23 Encounter for immunization: Secondary | ICD-10-CM

## 2018-04-22 DIAGNOSIS — E119 Type 2 diabetes mellitus without complications: Secondary | ICD-10-CM

## 2018-04-22 DIAGNOSIS — Z0000 Encounter for general adult medical examination without abnormal findings: Secondary | ICD-10-CM

## 2018-04-22 LAB — POCT GLYCOSYLATED HEMOGLOBIN (HGB A1C): HEMOGLOBIN A1C: 6.6 % — AB (ref 4.0–5.6)

## 2018-04-22 MED ORDER — CANAGLIFLOZIN 100 MG PO TABS: ORAL_TABLET | ORAL | 3 refills | Status: DC

## 2018-04-22 MED ORDER — JANUVIA 100 MG PO TABS: 100.0000 mg | ORAL_TABLET | Freq: Every day | ORAL | 4 refills | Status: DC

## 2018-04-22 MED ORDER — PANTOPRAZOLE SODIUM 40 MG PO TBEC: 40.0000 mg | DELAYED_RELEASE_TABLET | Freq: Every day | ORAL | 0 refills | Status: DC

## 2018-04-22 MED ORDER — PIOGLITAZONE HCL-METFORMIN HCL 15-500 MG PO TABS: ORAL_TABLET | ORAL | 4 refills | Status: DC

## 2018-04-22 MED ORDER — BROMOCRIPTINE MESYLATE 2.5 MG PO TABS: 2.5000 mg | ORAL_TABLET | Freq: Every day | ORAL | 3 refills | Status: DC

## 2018-04-22 MED ORDER — REPAGLINIDE 2 MG PO TABS: 4.0000 mg | ORAL_TABLET | Freq: Three times a day (TID) | ORAL | 3 refills | Status: DC

## 2018-04-22 MED ORDER — LISINOPRIL 5 MG PO TABS: 5.0000 mg | ORAL_TABLET | Freq: Every day | ORAL | 0 refills | Status: DC

## 2018-04-22 MED ORDER — METHOCARBAMOL 500 MG PO TABS: 500.0000 mg | ORAL_TABLET | Freq: Every evening | ORAL | 1 refills | Status: DC | PRN

## 2018-04-22 MED ORDER — ATORVASTATIN CALCIUM 40 MG PO TABS: 40.0000 mg | ORAL_TABLET | Freq: Every day | ORAL | 3 refills | Status: DC

## 2018-04-22 MED FILL — METHOCARBAMOL 500 MG TABLET: 500 | 90 days supply | Qty: 90 | Fill #0

## 2018-04-22 MED FILL — REPAGLINIDE 2 MG TABLET: 2 | 90 days supply | Qty: 540 | Fill #0

## 2018-04-22 MED FILL — PANTOPRAZOLE SOD DR 40 MG T: 40 | 90 days supply | Qty: 90 | Fill #0

## 2018-04-22 NOTE — Progress Notes (Signed)
Subjective:    Patient ID: Elijah Cervantes, male    DOB: 1956-05-15, 62 y.o.   MRN: [id number]  HPI Pt is here for regular wellness examination, and is feeling pretty well in general, and says chronic med probs are stable, except as noted below Past Medical History:  Diagnosis Date  . ABNORMAL ELECTROCARDIOGRAM 07/26/2009  . DEPRESSION 03/31/2007  . DIABETES MELLITUS, TYPE II 03/31/2007  . Dyspepsia   . 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 FATTY LIVER DISEASE 07/18/2008  . GERD 07/26/2009  . HSV-1 infection   . HYPERCHOLESTEROLEMIA 10/31/2010  . HYPERTENSION 03/31/2007  . NASH (nonalcoholic steatohepatitis)   . Personal history of colonic adenoma 02/10/2013    Past Surgical History:  Procedure Laterality Date  . COLONOSCOPY    . hernia repair    . POLYPECTOMY    . Stress Cardiolite  11/22/2003    Social History   Socioeconomic History  . Marital status: Married    Spouse name: Not on file  . Number of children: Not on file  . Years of education: Not on file  . Highest education level: Not on file  Occupational History  . Occupation: Unemployed  Social Needs  . Financial resource strain: Not on file  . Food insecurity:    Worry: Not on file    Inability: Not on file  . Transportation needs:    Medical: Not on file    Non-medical: Not on file  Tobacco Use  . Smoking status: Never Smoker  . Smokeless tobacco: Never Used  Substance and Sexual Activity  . Alcohol use: Yes    Comment: occasional use  . Drug use: No  . Sexual activity: Not on file  Lifestyle  . Physical activity:    Days per week: Not on file    Minutes per session: Not on file  . Stress: Not on file  Relationships  . Social connections:    Talks on phone: Not on file    Gets together: Not on file    Attends religious service: Not on file    Active member of club or organization: Not on file    Attends meetings of clubs or organizations: Not on file    Relationship status: Not on file    . Intimate partner violence:    Fear of current or ex partner: Not on file    Emotionally abused: Not on file    Physically abused: Not on file    Forced sexual activity: Not on file  Other Topics Concern  . Not on file  Social History Narrative  . Not on file    Current Outpatient Medications on File Prior to Visit  Medication Sig Dispense Refill  . aspirin 81 MG tablet Take 1 tablet (81 mg total) by mouth daily. 30 tablet 0  . glucose blood (FREESTYLE TEST STRIPS) test strip Use to test blood sugar once daily 100 each 3  . glucose monitoring kit (FREESTYLE) monitoring kit 1 each by Does not apply route as needed for other. 1 each 0  . Lancets (FREESTYLE) lancets Use to test blood sugar once daily 100 each 3  . triamcinolone (KENALOG) 0.025 % ointment Apply to affected area three times a day as needed for rash 80 g 3   No current facility-administered medications on file prior to visit.     No Known Allergies  Family History  Problem Relation Age of Onset  . Diabetes Mother   . Diabetes Brother   .  Cancer Neg Hx   . Colon cancer Neg Hx   . Esophageal cancer Neg Hx   . Stomach cancer Neg Hx   . Rectal cancer Neg Hx   . Colon polyps Neg Hx     BP 140/60 (BP Location: Right Arm, Patient Position: Sitting, Cuff Size: Normal)   Pulse 75   Temp 98.5 F (36.9 C) (Oral)   Ht 5' 2.5" (1.588 m)   Wt 151 lb 9.6 oz (68.8 kg)   SpO2 97%   BMI 27.29 kg/m     Review of Systems Denies fever, fatigue, visual loss, hearing loss, chest pain, sob, back pain, depression, cold intolerance, BRBPR, hematuria, syncope, numbness, allergy sxs, easy bruising, and rash.      Objective:   Physical Exam VS: see vs page GEN: no distress HEAD: head: no deformity eyes: no periorbital swelling, no proptosis external nose and ears are normal mouth: no lesion seen NECK: supple, thyroid is not enlarged CHEST WALL: no deformity LUNGS: clear to auscultation CV: reg rate and rhythm, no  murmur ABD: abdomen is soft, nontender.  no hepatosplenomegaly.  not distended.  no hernia MUSCULOSKELETAL: muscle bulk and strength are grossly normal.  no obvious joint swelling.  gait is normal and steady EXTEMITIES: no deformity.  no ulcer on the feet.  feet are of normal color and temp.  no edema PULSES: dorsalis pedis intact bilat.  no carotid bruit NEURO:  cn 2-12 grossly intact.   readily moves all 4's.  sensation is intact to touch on the feet SKIN:  Normal texture and temperature.  No rash or suspicious lesion is visible.   NODES:  None palpable at the neck PSYCH: alert, well-oriented.  Does not appear anxious nor depressed.   I personally reviewed electrocardiogram tracing (today):  Indication: wellness Impression: NSR.  No MI.  No hypertrophy.  Compared to 2018: high voltage and abnormal repolarization are improved       Assessment & Plan:  Wellness visit today, with problems stable, except as noted.   SEPARATE EVALUATION FOLLOWS--EACH PROBLEM HERE IS NEW, NOT RESPONDING TO TREATMENT, OR POSES SIGNIFICANT RISK TO THE PATIENT'S HEALTH: HISTORY OF THE PRESENT ILLNESS: Pt returns for f/u of diabetes mellitus:  DM type: 2 Dx'ed: 6378 Complications: none Therapy: 6 oral meds DKA: never Severe hypoglycemia: never.   Pancreatitis: never.   Other: he has declined acarbose and welchol; he declines insulin; however, wife is Therapist, sports, so he could take in emergency if necessary.   Interval history: no cbg record, but states cbg's are well-controlled.  pt states he feels well in general.  He ran out of bromocriptine.  PAST MEDICAL HISTORY Past Medical History:  Diagnosis Date  . ABNORMAL ELECTROCARDIOGRAM 07/26/2009  . DEPRESSION 03/31/2007  . DIABETES MELLITUS, TYPE II 03/31/2007  . Dyspepsia   . 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 FATTY LIVER DISEASE 07/18/2008  . GERD 07/26/2009  . HSV-1 infection   . HYPERCHOLESTEROLEMIA 10/31/2010  . HYPERTENSION 03/31/2007  . NASH  (nonalcoholic steatohepatitis)   . Personal history of colonic adenoma 02/10/2013    Past Surgical History:  Procedure Laterality Date  . COLONOSCOPY    . hernia repair    . POLYPECTOMY    . Stress Cardiolite  11/22/2003    Social History   Socioeconomic History  . Marital status: Married    Spouse name: Not on file  . Number of children: Not on file  . Years of education: Not on file  . Highest education level:  Not on file  Occupational History  . Occupation: Unemployed  Social Needs  . Financial resource strain: Not on file  . Food insecurity:    Worry: Not on file    Inability: Not on file  . Transportation needs:    Medical: Not on file    Non-medical: Not on file  Tobacco Use  . Smoking status: Never Smoker  . Smokeless tobacco: Never Used  Substance and Sexual Activity  . Alcohol use: Yes    Comment: occasional use  . Drug use: No  . Sexual activity: Not on file  Lifestyle  . Physical activity:    Days per week: Not on file    Minutes per session: Not on file  . Stress: Not on file  Relationships  . Social connections:    Talks on phone: Not on file    Gets together: Not on file    Attends religious service: Not on file    Active member of club or organization: Not on file    Attends meetings of clubs or organizations: Not on file    Relationship status: Not on file  . Intimate partner violence:    Fear of current or ex partner: Not on file    Emotionally abused: Not on file    Physically abused: Not on file    Forced sexual activity: Not on file  Other Topics Concern  . Not on file  Social History Narrative  . Not on file    Current Outpatient Medications on File Prior to Visit  Medication Sig Dispense Refill  . aspirin 81 MG tablet Take 1 tablet (81 mg total) by mouth daily. 30 tablet 0  . glucose blood (FREESTYLE TEST STRIPS) test strip Use to test blood sugar once daily 100 each 3  . glucose monitoring kit (FREESTYLE) monitoring kit 1 each by  Does not apply route as needed for other. 1 each 0  . Lancets (FREESTYLE) lancets Use to test blood sugar once daily 100 each 3  . triamcinolone (KENALOG) 0.025 % ointment Apply to affected area three times a day as needed for rash 80 g 3   No current facility-administered medications on file prior to visit.     No Known Allergies  Family History  Problem Relation Age of Onset  . Diabetes Mother   . Diabetes Brother   . Cancer Neg Hx   . Colon cancer Neg Hx   . Esophageal cancer Neg Hx   . Stomach cancer Neg Hx   . Rectal cancer Neg Hx   . Colon polyps Neg Hx     BP 140/60 (BP Location: Right Arm, Patient Position: Sitting, Cuff Size: Normal)   Pulse 75   Temp 98.5 F (36.9 C) (Oral)   Ht 5' 2.5" (1.588 m)   Wt 151 lb 9.6 oz (68.8 kg)   SpO2 97%   BMI 27.29 kg/m   REVIEW OF SYSTEMS: He denies hypoglycemia PHYSICAL EXAMINATION: VITAL SIGNS:  See vs page GENERAL: no distress Pulses: dorsalis pedis intact bilat.   MSK: no deformity of the feet CV: no leg edema Skin:  no ulcer on the feet.  normal color and temp on the feet. Neuro: sensation is intact to touch on the feet, but decreased from normal LAB/XRAY RESULTS: Lab Results  Component Value Date   HGBA1C 6.6 (A) 04/22/2018   IMPRESSION: Type 2 DM: well-controlled PLAN:  I have sent a prescription to your pharmacy, to resume the bromocriptine

## 2018-04-22 NOTE — Patient Instructions (Signed)
Please consider these measures for your health:  minimize alcohol.  Do not use tobacco products.  Have a colonoscopy at least every 10 years from age 62.  Keep firearms safely stored.  Always use seat belts.  have working smoke alarms in your home.  See an eye doctor and dentist regularly.  Never drive under the influence of alcohol or drugs (including prescription drugs).  Those with fair skin should take precautions against the sun, and should carefully examine their skin once per month, for any new or changed moles.   Please come back for a follow-up appointment in 3-6 months.

## 2018-04-28 ENCOUNTER — Ambulatory Visit (AMBULATORY_SURGERY_CENTER): Payer: No Typology Code available for payment source | Admitting: Internal Medicine

## 2018-04-28 ENCOUNTER — Encounter: Payer: Self-pay | Admitting: Internal Medicine

## 2018-04-28 VITALS — BP 124/62 | HR 70 | Temp 98.6°F | Resp 16 | Ht 62.0 in | Wt 151.0 lb

## 2018-04-28 DIAGNOSIS — Z86010 Personal history of colonic polyps: Secondary | ICD-10-CM | POA: Diagnosis present

## 2018-04-28 MED ORDER — SODIUM CHLORIDE 0.9 % IV SOLN: 500.0000 mL | Freq: Once | INTRAVENOUS | Status: DC

## 2018-04-28 NOTE — Progress Notes (Signed)
Pt's states no medical or surgical changes since previsit or office visit. 

## 2018-04-28 NOTE — Op Note (Signed)
[facility] Patient Name: Elijah Cervantes Procedure Date: 04/28/2018 6:58 AM MRN: [id number] Endoscopist: Gatha Mayer , MD Age: 62 Referring MD:  Date of Birth: 05/17/1956 Gender: Male Account #: [id number] Procedure:                Colonoscopy Indications:              Surveillance: Personal history of adenomatous                            polyps on last colonoscopy 5 years ago Medicines:                Propofol per Anesthesia, Monitored Anesthesia Care Procedure:                Pre-Anesthesia Assessment:                           - Prior to the procedure, a History and Physical                            was performed, and patient medications and                            allergies were reviewed. The patient's tolerance of                            previous anesthesia was also reviewed. The risks                            and benefits of the procedure and the sedation                            options and risks were discussed with the patient.                            All questions were answered, and informed consent                            was obtained. Prior Anticoagulants: The patient has                            taken no previous anticoagulant or antiplatelet                            agents. ASA Grade Assessment: II - A patient with                            mild systemic disease. After reviewing the risks                            and benefits, the patient was deemed in                            satisfactory condition to undergo the procedure.  After obtaining informed consent, the colonoscope                            was passed under direct vision. Throughout the                            procedure, the patient's blood pressure, pulse, and                            oxygen saturations were monitored continuously. The                            Model CF-HQ190L [id number]) scope was introduced   through the anus and advanced to the the cecum,                            identified by appendiceal orifice and ileocecal                            valve. The colonoscopy was performed without                            difficulty. The patient tolerated the procedure                            well. The quality of the bowel preparation was                            excellent. The bowel preparation used was Miralax.                            The ileocecal valve, appendiceal orifice, and                            rectum were photographed. Scope In: 7:58:20 AM Scope Out: 8:09:58 AM Scope Withdrawal Time: 0 hours 10 minutes 3 seconds  Total Procedure Duration: 0 hours 11 minutes 38 seconds  Findings:                 The perianal and digital rectal examinations were                            normal. Pertinent negatives include normal prostate                            (size, shape, and consistency).                           The entire examined colon appeared normal on direct                            and retroflexion views. Complications:            No immediate complications. Estimated Blood Loss:     Estimated blood loss: none. Impression:               -  The entire examined colon is normal on direct and                            retroflexion views.                           - No specimens collected.                           - Personal history of colonic polyp - diminutive                            adenoma x 1 2014. Recommendation:           - Patient has a contact number available for                            emergencies. The signs and symptoms of potential                            delayed complications were discussed with the                            patient. Return to normal activities tomorrow.                            Written discharge instructions were provided to the                            patient.                           - Resume previous diet.                            - Continue present medications.                           - Repeat colonoscopy in 10 years. Gatha Mayer, MD 04/28/2018 8:16:44 AM This report has been signed electronically.

## 2018-04-28 NOTE — Patient Instructions (Addendum)
   No polyps today! Next routine colonoscopy or other screening test in 10 years - 2029   I appreciate the opportunity to care for you. Gatha Mayer, MD, FACG  YOU HAD AN ENDOSCOPIC PROCEDURE TODAY AT [facility]:   Refer to the procedure report that was given to you for any specific questions about what was found during the examination.  If the procedure report does not answer your questions, please call your gastroenterologist to clarify.  If you requested that your care partner not be given the details of your procedure findings, then the procedure report has been included in a sealed envelope for you to review at your convenience later.  YOU SHOULD EXPECT: Some feelings of bloating in the abdomen. Passage of more gas than usual.  Walking can help get rid of the air that was put into your GI tract during the procedure and reduce the bloating. If you had a lower endoscopy (such as a colonoscopy or flexible sigmoidoscopy) you may notice spotting of blood in your stool or on the toilet paper. If you underwent a bowel prep for your procedure, you may not have a normal bowel movement for a few days.  Please Note:  You might notice some irritation and congestion in your nose or some drainage.  This is from the oxygen used during your procedure.  There is no need for concern and it should clear up in a day or so.  SYMPTOMS TO REPORT IMMEDIATELY:   Following lower endoscopy (colonoscopy or flexible sigmoidoscopy):  Excessive amounts of blood in the stool  Significant tenderness or worsening of abdominal pains  Swelling of the abdomen that is new, acute  Fever of 100F or higher     For urgent or emergent issues, a gastroenterologist can be reached at any hour by calling [phone].   DIET:  We do recommend a small meal at first, but then you may proceed to your regular diet.  Drink plenty of fluids but you should avoid alcoholic beverages for 24  hours.  ACTIVITY:  You should plan to take it easy for the rest of today and you should NOT DRIVE or use heavy machinery until tomorrow (because of the sedation medicines used during the test).    FOLLOW UP: Our staff will call the number listed on your records the next business day following your procedure to check on you and address any questions or concerns that you may have regarding the information given to you following your procedure. If we do not reach you, we will leave a message.  However, if you are feeling well and you are not experiencing any problems, there is no need to return our call.  We will assume that you have returned to your regular daily activities without incident.  If any biopsies were taken you will be contacted by phone or by letter within the next 1-3 weeks.  Please call us at [phone] if you have not heard about the biopsies in 3 weeks.    SIGNATURES/CONFIDENTIALITY: You and/or your care partner have signed paperwork which will be entered into your electronic medical record.  These signatures attest to the fact that that the information above on your After Visit Summary has been reviewed and is understood.  Full responsibility of the confidentiality of this discharge information lies with you and/or your care-partner.

## 2018-04-28 NOTE — Progress Notes (Signed)
Report to PACU, RN, vss, BBS= Clear.  

## 2018-04-29 ENCOUNTER — Telehealth: Payer: Self-pay

## 2018-04-29 NOTE — Telephone Encounter (Signed)
  Follow up Call-  Call back number 04/28/2018  Post procedure Call Back phone  # [phone]  Permission to leave phone message Yes  Some recent data might be hidden     Patient questions:  Do you have a fever, pain , or abdominal swelling? No. Pain Score  0 *  Have you tolerated food without any problems? Yes.    Have you been able to return to your normal activities? Yes.    Do you have any questions about your discharge instructions: Diet   No. Medications  No. Follow up visit  No.  Do you have questions or concerns about your Care? No.  Actions: * If pain score is 4 or above: No action needed, pain <4.

## 2018-06-15 ENCOUNTER — Other Ambulatory Visit: Payer: Self-pay | Admitting: Endocrinology

## 2018-06-15 MED FILL — INVOKANA 100 MG TABLET: 100 | 90 days supply | Qty: 90 | Fill #2

## 2018-06-15 MED FILL — JANUVIA 100 MG TABLET: 100 | 60 days supply | Qty: 60 | Fill #2

## 2018-06-15 MED FILL — PIOGLIT-METFORMIN 15-500: 15-500 | 90 days supply | Qty: 270 | Fill #2

## 2018-06-15 MED FILL — BROMOCRIPTINE 2.5 MG TABLET: 2.5 | 90 days supply | Qty: 90 | Fill #2

## 2018-06-15 MED FILL — ATORVASTATIN 40 MG TABLET: 40 | 90 days supply | Qty: 90 | Fill #3

## 2018-06-15 MED FILL — LISINOPRIL 5 MG TABLET: 5 | 90 days supply | Qty: 90 | Fill #0

## 2018-07-15 ENCOUNTER — Encounter: Payer: Self-pay | Admitting: Endocrinology

## 2018-07-15 LAB — HM DIABETES EYE EXAM

## 2018-07-17 MED FILL — REPAGLINIDE 2 MG TABS: 2 | 90 days supply | Qty: 540 | Fill #1

## 2018-09-09 ENCOUNTER — Other Ambulatory Visit: Payer: Self-pay | Admitting: Endocrinology

## 2018-09-09 MED FILL — PIOGLIT-METFORMIN 15-500: 15-500 | 90 days supply | Qty: 270 | Fill #3

## 2018-09-09 MED FILL — INVOKANA 100 MG TABLET: 100 | 90 days supply | Qty: 90 | Fill #3

## 2018-09-09 MED FILL — ATORVASTATIN CALCIUM 40 MG: 40 | 90 days supply | Qty: 90 | Fill #0

## 2018-09-09 MED FILL — LISINOPRIL 5 MG TABLET: 5 | 90 days supply | Qty: 90 | Fill #0

## 2018-09-09 MED FILL — BROMOCRIPTINE 2.5 MG TABLET: 2.5 | 90 days supply | Qty: 90 | Fill #3

## 2018-09-09 NOTE — Telephone Encounter (Signed)
Please refill x 3 months Further refills would have to be considered by new PCP   

## 2018-09-17 ENCOUNTER — Ambulatory Visit (INDEPENDENT_AMBULATORY_CARE_PROVIDER_SITE_OTHER): Payer: No Typology Code available for payment source | Admitting: Family Medicine

## 2018-09-17 ENCOUNTER — Encounter: Payer: Self-pay | Admitting: Family Medicine

## 2018-09-17 VITALS — BP 126/70 | HR 78 | Ht 62.0 in | Wt 151.2 lb

## 2018-09-17 DIAGNOSIS — I10 Essential (primary) hypertension: Secondary | ICD-10-CM

## 2018-09-17 DIAGNOSIS — E119 Type 2 diabetes mellitus without complications: Secondary | ICD-10-CM | POA: Diagnosis not present

## 2018-09-17 DIAGNOSIS — E7800 Pure hypercholesterolemia, unspecified: Secondary | ICD-10-CM

## 2018-09-17 NOTE — Progress Notes (Signed)
Established Patient Office Visit  Subjective:  Patient ID: Elijah Cervantes, male    DOB: 12-22-1955  Age: 63 y.o. MRN: [id number]  CC:  Chief Complaint  Patient presents with  . Establish Care    HPI Elijah Cervantes presents for establishment of care by way of transfer from Dr. Loanne Drilling. He had a physical back in August last year and did receive his flu vaccine.  Patient has been doing well.  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  He is accompanied by his wife today.  He stays active in the yard.  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  He is Nurse, learning disability.  They have 4 sons 3 of them live close by their 2 grandchildren.  He rarely drinks alcohol and does not smoke or use illicit drugs.  Past Medical History:  Diagnosis Date  . ABNORMAL ELECTROCARDIOGRAM 07/26/2009  . DEPRESSION 03/31/2007  . DIABETES MELLITUS, TYPE II 03/31/2007  . Dyspepsia   . 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 FATTY LIVER DISEASE 07/18/2008  . GERD 07/26/2009  . HSV-1 infection   . HYPERCHOLESTEROLEMIA 10/31/2010  . HYPERTENSION 03/31/2007  . NASH (nonalcoholic steatohepatitis)   . Personal history of colonic adenoma 02/10/2013    Past Surgical History:  Procedure Laterality Date  . COLONOSCOPY     2014 1 adenoma 2019 none  . hernia repair    . Stress Cardiolite  11/22/2003    Family History  Problem Relation Age of Onset  . Diabetes Mother   . Diabetes Brother   . Cancer Neg Hx   . Colon cancer Neg Hx   . Esophageal cancer Neg Hx   . Stomach cancer Neg Hx   . Rectal cancer Neg Hx   . Colon polyps Neg Hx     Social History   Socioeconomic History  . Marital status: Married    Spouse name: Not on file  . Number of children: Not on file  . Years of education: Not on file  . Highest education level: Not on file  Occupational History  . Occupation: Unemployed  Social Needs  . Financial resource  strain: Not on file  . Food insecurity:    Worry: Not on file    Inability: Not on file  . Transportation needs:    Medical: Not on file    Non-medical: Not on file  Tobacco Use  . Smoking status: Never Smoker  . Smokeless tobacco: Never Used  Substance and Sexual Activity  . Alcohol use: Yes    Comment: occasional use  . Drug use: No  . Sexual activity: Not on file  Lifestyle  . Physical activity:    Days per week: Not on file    Minutes per session: Not on file  . Stress: Not on file  Relationships  . Social connections:    Talks on phone: Not on file    Gets together: Not on file    Attends religious service: Not on file    Active member of club or organization: Not on file    Attends meetings of clubs or organizations: Not on file    Relationship status: Not on file  . Intimate partner violence:    Fear of current or ex partner: Not on file    Emotionally abused: Not on file    Physically abused: Not on file  Forced sexual activity: Not on file  Other Topics Concern  . Not on file  Social History Narrative  . Not on file    Outpatient Medications Prior to Visit  Medication Sig Dispense Refill  . aspirin 81 MG tablet Take 1 tablet (81 mg total) by mouth daily. 30 tablet 0  . atorvastatin (LIPITOR) 40 MG tablet Take 1 tablet (40 mg total) by mouth daily. 90 tablet 3  . bromocriptine (PARLODEL) 2.5 MG tablet Take 1 tablet (2.5 mg total) by mouth daily. 90 tablet 3  . canagliflozin (INVOKANA) 100 MG TABS tablet TAKE 1 TABLET (100 MG TOTAL) BY MOUTH DAILY. 90 tablet 3  . glucose blood (FREESTYLE TEST STRIPS) test strip Use to test blood sugar once daily 100 each 3  . glucose monitoring kit (FREESTYLE) monitoring kit 1 each by Does not apply route as needed for other. 1 each 0  . JANUVIA 100 MG tablet Take 1 tablet (100 mg total) by mouth daily. 90 tablet 4  . Lancets (FREESTYLE) lancets Use to test blood sugar once daily 100 each 3  . lisinopril (PRINIVIL,ZESTRIL) 5  MG tablet TAKE 1 TABLET BY MOUTH ONCE DAILY 90 tablet 0  . lisinopril (PRINIVIL,ZESTRIL) 5 MG tablet TAKE 1 TABLET BY MOUTH DAILY. 90 tablet 0  . methocarbamol (ROBAXIN) 500 MG tablet Take 1 tablet (500 mg total) by mouth at bedtime as needed for muscle spasms. For leg cramps 90 tablet 1  . pantoprazole (PROTONIX) 40 MG tablet Take 1 tablet (40 mg total) by mouth daily. 90 tablet 0  . pioglitazone-metformin (ACTOPLUS MET) 15-500 MG tablet TAKE 2 TABLETS BY MOUTH EVERY MORNING AND 1 TABLET BY MOUTH EVERY EVENING 270 tablet 4  . repaglinide (PRANDIN) 2 MG tablet Take 2 tablets (4 mg total) by mouth 3 (three) times daily before meals. 540 tablet 3  . triamcinolone (KENALOG) 0.025 % ointment Apply to affected area three times a day as needed for rash 80 g 3   No facility-administered medications prior to visit.     No Known Allergies  ROS Review of Systems  Constitutional: Negative.   HENT: Negative.   Eyes: Negative.   Respiratory: Negative.   Cardiovascular: Negative.   Gastrointestinal: Negative.   Endocrine: Negative for polyphagia and polyuria.  Skin: Negative for pallor and rash.  Neurological: Negative.   Hematological: Negative.   Psychiatric/Behavioral: Negative.       Objective:    Physical Exam  Constitutional: He is oriented to person, place, and time. He appears well-developed and well-nourished. No distress.  HENT:  Head: Normocephalic and atraumatic.  Right Ear: External ear normal.  Left Ear: External ear normal.  Mouth/Throat: Oropharynx is clear and moist. No oropharyngeal exudate.  Eyes: Pupils are equal, round, and reactive to light. Conjunctivae are normal. Right eye exhibits no discharge. Left eye exhibits no discharge. No scleral icterus.  Neck: Neck supple. No JVD present. No tracheal deviation present. No thyromegaly present.  Cardiovascular: Normal rate, regular rhythm and normal heart sounds.  Pulmonary/Chest: Effort normal and breath sounds normal. No  stridor.  Abdominal: He exhibits distension. He exhibits no mass. There is no abdominal tenderness. There is no rebound and no guarding.  Musculoskeletal:        General: No edema.  Lymphadenopathy:    He has no cervical adenopathy.  Neurological: He is alert and oriented to person, place, and time.  Skin: Skin is warm and dry. No rash noted. He is not diaphoretic. No erythema.  Psychiatric:  He has a normal mood and affect. His behavior is normal.    BP 126/70   Pulse 78   Ht _0  (1.575 m)   Wt 151 lb 4 oz (68.6 kg)   SpO2 96%   BMI 27.66 kg/m  Wt Readings from Last 3 Encounters:  09/17/18 151 lb 4 oz (68.6 kg)  04/28/18 151 lb (68.5 kg)  04/22/18 151 lb 9.6 oz (68.8 kg)   BP Readings from Last 3 Encounters:  09/17/18 126/70  04/28/18 124/62  04/22/18 140/60   Guideline developer:  UpToDate (see UpToDate for funding source) Date Released: June 2014  There are no preventive care reminders to display for this patient.  There are no preventive care reminders to display for this patient.  Lab Results  Component Value Date   TSH 1.66 09/23/2017   Lab Results  Component Value Date   WBC 5.6 09/23/2017   HGB 15.5 09/23/2017   HCT 46.8 09/23/2017   MCV 94.8 09/23/2017   PLT 207.0 09/23/2017   Lab Results  Component Value Date   NA 138 09/23/2017   K 4.2 09/23/2017   CO2 29 09/23/2017   GLUCOSE 202 (H) 09/23/2017   BUN 17 09/23/2017   CREATININE 1.00 09/23/2017   BILITOT 0.7 09/23/2017   ALKPHOS 65 09/23/2017   AST 21 09/23/2017   ALT 28 09/23/2017   PROT 7.0 09/23/2017   ALBUMIN 4.4 09/23/2017   CALCIUM 9.6 09/23/2017   GFR 80.32 09/23/2017   Lab Results  Component Value Date   CHOL 146 09/23/2017   Lab Results  Component Value Date   HDL 44.30 09/23/2017   Lab Results  Component Value Date   LDLCALC 69 09/23/2017   Lab Results  Component Value Date   TRIG 164.0 (H) 09/23/2017   Lab Results  Component Value Date   CHOLHDL 3 09/23/2017    Lab Results  Component Value Date   HGBA1C 6.6 (A) 04/22/2018      Assessment & Plan:   Problem List Items Addressed This Visit      Cardiovascular and Mediastinum   Essential hypertension - Primary     Endocrine   Diabetes ([place])   Relevant Orders   Ambulatory referral to Endocrinology     Other   HYPERCHOLESTEROLEMIA      No orders of the defined types were placed in this encounter.   Follow-up: Return return for physical exam.

## 2018-10-12 MED FILL — REPAGLINIDE 2 MG TABS: 2 | 90 days supply | Qty: 540 | Fill #2

## 2018-10-26 ENCOUNTER — Ambulatory Visit: Payer: No Typology Code available for payment source | Admitting: Endocrinology

## 2018-10-28 ENCOUNTER — Other Ambulatory Visit: Payer: Self-pay

## 2018-10-28 ENCOUNTER — Encounter: Payer: Self-pay | Admitting: Endocrinology

## 2018-10-28 ENCOUNTER — Ambulatory Visit (INDEPENDENT_AMBULATORY_CARE_PROVIDER_SITE_OTHER): Payer: No Typology Code available for payment source | Admitting: Endocrinology

## 2018-10-28 VITALS — BP 134/62 | HR 103 | Ht 62.0 in | Wt 150.0 lb

## 2018-10-28 DIAGNOSIS — E119 Type 2 diabetes mellitus without complications: Secondary | ICD-10-CM | POA: Diagnosis not present

## 2018-10-28 LAB — POCT GLYCOSYLATED HEMOGLOBIN (HGB A1C): HEMOGLOBIN A1C: 6.7 % — AB (ref 4.0–5.6)

## 2018-10-28 NOTE — Patient Instructions (Signed)
Please continue the same medications. check your blood sugar once a day.  vary the time of day when you check, between before the 3 meals, and at bedtime.  also check if you have symptoms of your blood sugar being too high or too low.  please keep a record of the readings and bring it to your next appointment here (or you can bring the meter itself).  You can write it on any piece of paper.  please call us sooner if your blood sugar goes below 70, or if you have a lot of readings over 200. Please come back for a follow-up appointment in 3-6 months.

## 2018-10-28 NOTE — Progress Notes (Signed)
Subjective:    Patient ID: Elijah Cervantes, male    DOB: 01/02/1956, 63 y.o.   MRN: [id number]  HPI Pt returns for f/u of diabetes mellitus:  DM type: 2 Dx'ed: 7782 Complications: none Therapy: 6 oral meds DKA: never Severe hypoglycemia: never.   Pancreatitis: never.   Other: he has declined acarbose and welchol; he declines insulin; however, wife is Therapist, sports, so he could take in emergency if necessary.   Interval history: He brings a record of his cbg's which I have reviewed today.  cbg varies from 64-128.  pt states he feels well in general.   Past Medical History:  Diagnosis Date  . ABNORMAL ELECTROCARDIOGRAM 07/26/2009  . DEPRESSION 03/31/2007  . DIABETES MELLITUS, TYPE II 03/31/2007  . Dyspepsia   . 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 FATTY LIVER DISEASE 07/18/2008  . GERD 07/26/2009  . HSV-1 infection   . HYPERCHOLESTEROLEMIA 10/31/2010  . HYPERTENSION 03/31/2007  . NASH (nonalcoholic steatohepatitis)   . Personal history of colonic adenoma 02/10/2013    Past Surgical History:  Procedure Laterality Date  . COLONOSCOPY     2014 1 adenoma 2019 none  . hernia repair    . Stress Cardiolite  11/22/2003    Social History   Socioeconomic History  . Marital status: Married    Spouse name: Not on file  . Number of children: Not on file  . Years of education: Not on file  . Highest education level: Not on file  Occupational History  . Occupation: Unemployed  Social Needs  . Financial resource strain: Not on file  . Food insecurity:    Worry: Not on file    Inability: Not on file  . Transportation needs:    Medical: Not on file    Non-medical: Not on file  Tobacco Use  . Smoking status: Never Smoker  . Smokeless tobacco: Never Used  Substance and Sexual Activity  . Alcohol use: Yes    Comment: occasional use  . Drug use: No  . Sexual activity: Not on file  Lifestyle  . Physical activity:    Days per week: Not on file    Minutes per session: Not on file  .  Stress: Not on file  Relationships  . Social connections:    Talks on phone: Not on file    Gets together: Not on file    Attends religious service: Not on file    Active member of club or organization: Not on file    Attends meetings of clubs or organizations: Not on file    Relationship status: Not on file  . Intimate partner violence:    Fear of current or ex partner: Not on file    Emotionally abused: Not on file    Physically abused: Not on file    Forced sexual activity: Not on file  Other Topics Concern  . Not on file  Social History Narrative  . Not on file    Current Outpatient Medications on File Prior to Visit  Medication Sig Dispense Refill  . aspirin 81 MG tablet Take 1 tablet (81 mg total) by mouth daily. 30 tablet 0  . atorvastatin (LIPITOR) 40 MG tablet Take 1 tablet (40 mg total) by mouth daily. 90 tablet 3  . bromocriptine (PARLODEL) 2.5 MG tablet Take 1 tablet (2.5 mg total) by mouth daily. 90 tablet 3  . canagliflozin (INVOKANA) 100 MG TABS tablet TAKE 1 TABLET (100 MG TOTAL) BY MOUTH DAILY. 90 tablet 3  .  glucose blood (FREESTYLE TEST STRIPS) test strip Use to test blood sugar once daily 100 each 3  . glucose monitoring kit (FREESTYLE) monitoring kit 1 each by Does not apply route as needed for other. 1 each 0  . JANUVIA 100 MG tablet Take 1 tablet (100 mg total) by mouth daily. 90 tablet 4  . Lancets (FREESTYLE) lancets Use to test blood sugar once daily 100 each 3  . lisinopril (PRINIVIL,ZESTRIL) 5 MG tablet TAKE 1 TABLET BY MOUTH ONCE DAILY 90 tablet 0  . methocarbamol (ROBAXIN) 500 MG tablet Take 1 tablet (500 mg total) by mouth at bedtime as needed for muscle spasms. For leg cramps 90 tablet 1  . pantoprazole (PROTONIX) 40 MG tablet Take 1 tablet (40 mg total) by mouth daily. 90 tablet 0  . pioglitazone-metformin (ACTOPLUS MET) 15-500 MG tablet TAKE 2 TABLETS BY MOUTH EVERY MORNING AND 1 TABLET BY MOUTH EVERY EVENING 270 tablet 4  . repaglinide (PRANDIN) 2  MG tablet Take 2 tablets (4 mg total) by mouth 3 (three) times daily before meals. 540 tablet 3  . triamcinolone (KENALOG) 0.025 % ointment Apply to affected area three times a day as needed for rash 80 g 3   No current facility-administered medications on file prior to visit.     No Known Allergies  Family History  Problem Relation Age of Onset  . Diabetes Mother   . Diabetes Brother   . Cancer Neg Hx   . Colon cancer Neg Hx   . Esophageal cancer Neg Hx   . Stomach cancer Neg Hx   . Rectal cancer Neg Hx   . Colon polyps Neg Hx     BP 134/62 (BP Location: Left Arm, Patient Position: Sitting, Cuff Size: Normal)   Pulse (!) 103   Ht '5\' 2"'$  (1.575 m)   Wt 150 lb (68 kg)   SpO2 95%   BMI 27.44 kg/m    Review of Systems He denies hypoglycemia.      Objective:   Physical Exam VITAL SIGNS:  See vs page GENERAL: no distress Pulses: dorsalis pedis intact bilat.   MSK: no deformity of the feet CV: no leg edema Skin:  no ulcer on the feet.  normal color and temp on the feet. Neuro: sensation is intact to touch on the feet  Lab Results  Component Value Date   HGBA1C 6.7 (A) 10/28/2018        Assessment & Plan:  Type 2 DM: well-controlled Hypoglycemia: This limits aggressiveness of glycemic control   Patient Instructions  Please continue the same medications. check your blood sugar once a day.  vary the time of day when you check, between before the 3 meals, and at bedtime.  also check if you have symptoms of your blood sugar being too high or too low.  please keep a record of the readings and bring it to your next appointment here (or you can bring the meter itself).  You can write it on any piece of paper.  please call us sooner if your blood sugar goes below 70, or if you have a lot of readings over 200. Please come back for a follow-up appointment in 3-6 months.

## 2018-11-23 MED FILL — JANUVIA 100 MG TABLET: 100 | 60 days supply | Qty: 60 | Fill #4

## 2018-12-04 MED FILL — PIOGLIT-METFORMIN 15-500: 15-500 | 90 days supply | Qty: 270 | Fill #4

## 2018-12-04 MED FILL — ATORVASTATIN 40 MG TABLET: 40 | 90 days supply | Qty: 90 | Fill #1

## 2018-12-04 MED FILL — LISINOPRIL 5 MG TABLET: 5 | 90 days supply | Qty: 90 | Fill #0

## 2018-12-04 MED FILL — INVOKANA 100 MG TABLET: 100 | 90 days supply | Qty: 90 | Fill #0

## 2018-12-04 MED FILL — BROMOCRIPTINE 2.5 MG TABLET: 2.5 | 90 days supply | Qty: 90 | Fill #0

## 2018-12-30 ENCOUNTER — Telehealth: Payer: Self-pay | Admitting: Family Medicine

## 2018-12-30 NOTE — Telephone Encounter (Signed)
Called pt on behalf of Dr [name] to see if he wanted to go ahead and schedule next CPE which would be sometime after August 21 of 2020, left message

## 2019-01-07 MED FILL — REPAGLINIDE 2 MG TABS: 2 | 90 days supply | Qty: 540 | Fill #3

## 2019-01-18 MED FILL — JANUVIA 100 MG TABLET: 100 | 60 days supply | Qty: 60 | Fill #0

## 2019-03-02 ENCOUNTER — Other Ambulatory Visit: Payer: Self-pay | Admitting: Endocrinology

## 2019-03-02 MED FILL — BROMOCRIPTINE 2.5 MG TABLET: 2.5 | 90 days supply | Qty: 90 | Fill #1

## 2019-03-02 MED FILL — PIOGLIT-METFORMIN 15-500: 15-500 | 90 days supply | Qty: 270 | Fill #0

## 2019-03-02 MED FILL — ATORVASTATIN 40 MG TABLET: 40 | 90 days supply | Qty: 90 | Fill #2

## 2019-03-02 MED FILL — INVOKANA 100 MG TABLET: 100 | 90 days supply | Qty: 90 | Fill #1

## 2019-03-11 ENCOUNTER — Other Ambulatory Visit: Payer: Self-pay | Admitting: Endocrinology

## 2019-03-11 MED FILL — LISINOPRIL 5 MG TABLET: 5 | 90 days supply | Qty: 90 | Fill #0

## 2019-03-11 NOTE — Telephone Encounter (Signed)
Please forward refill request to pt's new primary care provider.  

## 2019-03-11 NOTE — Telephone Encounter (Signed)
At Dr. Cordelia Pen request, I am forwarding you this refill request.

## 2019-03-11 NOTE — Telephone Encounter (Signed)
Please advise if you wish to manage and refill 

## 2019-03-11 NOTE — Telephone Encounter (Signed)
Needs to return fasting for a physical

## 2019-03-16 ENCOUNTER — Telehealth: Payer: Self-pay | Admitting: Endocrinology

## 2019-03-16 NOTE — Telephone Encounter (Signed)
We have not received anything to date to complete a PA. Also reviewed pt chart to determine if pt is still taking this medication. According to Dr. Cordelia Pen note, pt is taking this medication as Rx'd. Further reviewed chart to determine insurance provider. Last insurance card scanned in to system from 09/2017 is for [facility], [employer]. Called pt to inquire if he is still on this plan. If not, will need to provide new card so PA can be completed. LVM requesting returned call.

## 2019-03-16 NOTE — Telephone Encounter (Signed)
[employer] has called in regards on confirming if we have starting patients prior auth for there canagliflozin (INVOKANA) 100 MG TABS tablet   Please Advise, Thanks

## 2019-03-19 MED FILL — JANUVIA 100 MG TABLET: 100 | 60 days supply | Qty: 60 | Fill #1

## 2019-03-24 ENCOUNTER — Telehealth: Payer: Self-pay

## 2019-03-24 NOTE — Telephone Encounter (Signed)
PA initiated today through [employer] for Invokana. Will await insurance response re: approval/denial.  Zerek Litsey (Key: [id number]) Invokana 100MG  tablets   Form [employer] ePA Form Created 5 minutes ago Sent to Plan less than a minute ago Plan Response less than a minute ago Submit Clinical Questions Determination N/A Message from Plan Prior Authorization is not required for this medication dosage form and strength at the quantity and days supply requested.  Called [employer] @ [phone] to verify above response. Spoke with Theondrick Ref #[id number]. Confirmed above information, no PA required for 90 day supply tablets of this medication.

## 2019-04-02 ENCOUNTER — Other Ambulatory Visit: Payer: Self-pay | Admitting: Endocrinology

## 2019-04-03 MED FILL — REPAGLINIDE 2 MG TABS: 2 | 90 days supply | Qty: 540 | Fill #0

## 2019-04-12 ENCOUNTER — Other Ambulatory Visit: Payer: Self-pay | Admitting: Endocrinology

## 2019-04-12 NOTE — Telephone Encounter (Signed)
Please forward refill request to pt's new primary care provider.  

## 2019-04-12 NOTE — Telephone Encounter (Signed)
Please advise if you wish to manage and refill 

## 2019-04-15 ENCOUNTER — Telehealth: Payer: Self-pay | Admitting: Endocrinology

## 2019-04-15 NOTE — Telephone Encounter (Signed)
[facility] ph# [phone] called re: status of refill request Pantoprazole. Please call above PHARM to advise.

## 2019-04-15 NOTE — Telephone Encounter (Signed)
Dr. Loanne Drilling last ordered this a year ago while still doing primary care. This will need refilled by PCP.

## 2019-04-16 ENCOUNTER — Other Ambulatory Visit: Payer: Self-pay

## 2019-04-16 MED ORDER — PANTOPRAZOLE SODIUM 40 MG PO TBEC: 40.0000 mg | DELAYED_RELEASE_TABLET | Freq: Every day | ORAL | 0 refills | Status: DC

## 2019-04-16 MED FILL — PANTOPRAZOLE SOD DR 40 MG T: 40 | 90 days supply | Qty: 90 | Fill #0

## 2019-04-28 ENCOUNTER — Encounter: Payer: Self-pay | Admitting: Endocrinology

## 2019-04-28 ENCOUNTER — Other Ambulatory Visit: Payer: Self-pay

## 2019-04-28 ENCOUNTER — Ambulatory Visit (INDEPENDENT_AMBULATORY_CARE_PROVIDER_SITE_OTHER): Payer: No Typology Code available for payment source | Admitting: Endocrinology

## 2019-04-28 ENCOUNTER — Telehealth: Payer: Self-pay

## 2019-04-28 VITALS — BP 128/70 | HR 72 | Ht 62.0 in | Wt 153.4 lb

## 2019-04-28 DIAGNOSIS — E785 Hyperlipidemia, unspecified: Secondary | ICD-10-CM

## 2019-04-28 DIAGNOSIS — E119 Type 2 diabetes mellitus without complications: Secondary | ICD-10-CM

## 2019-04-28 DIAGNOSIS — K7581 Nonalcoholic steatohepatitis (NASH): Secondary | ICD-10-CM | POA: Diagnosis not present

## 2019-04-28 DIAGNOSIS — Z23 Encounter for immunization: Secondary | ICD-10-CM

## 2019-04-28 LAB — POCT GLYCOSYLATED HEMOGLOBIN (HGB A1C): Hemoglobin A1C: 6.7 % — AB (ref 4.0–5.6)

## 2019-04-28 NOTE — Progress Notes (Signed)
Subjective:    Patient ID: Elijah Cervantes, male    DOB: 08-06-1956, 62 y.o.   MRN: [id number]  HPI Pt returns for f/u of diabetes mellitus:  DM type: 2 Dx'ed: 4128 Complications: none Therapy: 6 oral meds DKA: never Severe hypoglycemia: never.   Pancreatitis: never.   Other: he has declined acarbose and welchol; he declines insulin; however, wife is Therapist, sports, so he could take in emergency if necessary.   Interval history: He brings a record of his cbg's which I have reviewed today.  cbg varies from 102-158.  pt states he feels well in general.   Past Medical History:  Diagnosis Date  . ABNORMAL ELECTROCARDIOGRAM 07/26/2009  . DEPRESSION 03/31/2007  . DIABETES MELLITUS, TYPE II 03/31/2007  . Dyspepsia   . 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 FATTY LIVER DISEASE 07/18/2008  . GERD 07/26/2009  . HSV-1 infection   . HYPERCHOLESTEROLEMIA 10/31/2010  . HYPERTENSION 03/31/2007  . NASH (nonalcoholic steatohepatitis)   . Personal history of colonic adenoma 02/10/2013    Past Surgical History:  Procedure Laterality Date  . COLONOSCOPY     2014 1 adenoma 2019 none  . hernia repair    . Stress Cardiolite  11/22/2003    Social History   Socioeconomic History  . Marital status: Married    Spouse name: Not on file  . Number of children: Not on file  . Years of education: Not on file  . Highest education level: Not on file  Occupational History  . Occupation: Unemployed  Social Needs  . Financial resource strain: Not on file  . Food insecurity    Worry: Not on file    Inability: Not on file  . Transportation needs    Medical: Not on file    Non-medical: Not on file  Tobacco Use  . Smoking status: Never Smoker  . Smokeless tobacco: Never Used  Substance and Sexual Activity  . Alcohol use: Yes    Comment: occasional use  . Drug use: No  . Sexual activity: Not on file  Lifestyle  . Physical activity    Days per week: Not on file    Minutes per session: Not on file  .  Stress: Not on file  Relationships  . Social Herbalist on phone: Not on file    Gets together: Not on file    Attends religious service: Not on file    Active member of club or organization: Not on file    Attends meetings of clubs or organizations: Not on file    Relationship status: Not on file  . Intimate partner violence    Fear of current or ex partner: Not on file    Emotionally abused: Not on file    Physically abused: Not on file    Forced sexual activity: Not on file  Other Topics Concern  . Not on file  Social History Narrative  . Not on file    Current Outpatient Medications on File Prior to Visit  Medication Sig Dispense Refill  . aspirin 81 MG tablet Take 1 tablet (81 mg total) by mouth daily. 30 tablet 0  . bromocriptine (PARLODEL) 2.5 MG tablet Take 1 tablet (2.5 mg total) by mouth daily. 90 tablet 3  . canagliflozin (INVOKANA) 100 MG TABS tablet TAKE 1 TABLET (100 MG TOTAL) BY MOUTH DAILY. 90 tablet 3  . glucose blood (FREESTYLE TEST STRIPS) test strip Use to test blood sugar once daily 100 each 3  .  glucose monitoring kit (FREESTYLE) monitoring kit 1 each by Does not apply route as needed for other. 1 each 0  . JANUVIA 100 MG tablet Take 1 tablet (100 mg total) by mouth daily. 90 tablet 4  . Lancets (FREESTYLE) lancets Use to test blood sugar once daily 100 each 3  . methocarbamol (ROBAXIN) 500 MG tablet Take 1 tablet (500 mg total) by mouth at bedtime as needed for muscle spasms. For leg cramps 90 tablet 1  . pantoprazole (PROTONIX) 40 MG tablet Take 1 tablet (40 mg total) by mouth daily. 90 tablet 0  . pioglitazone-metformin (ACTOPLUS MET) 15-500 MG tablet TAKE 2 TABLETS BY MOUTH EVERY MORNING AND 1 TABLET BY MOUTH EVERY EVENING 270 tablet 4  . repaglinide (PRANDIN) 2 MG tablet TAKE 2 TABLETS BY MOUTH 3 TIMES DAILY BEFORE MEALS. 540 tablet 3  . triamcinolone (KENALOG) 0.025 % ointment Apply to affected area three times a day as needed for rash 80 g 3    No current facility-administered medications on file prior to visit.     No Known Allergies  Family History  Problem Relation Age of Onset  . Diabetes Mother   . Diabetes Brother   . Cancer Neg Hx   . Colon cancer Neg Hx   . Esophageal cancer Neg Hx   . Stomach cancer Neg Hx   . Rectal cancer Neg Hx   . Colon polyps Neg Hx     BP 128/70 (BP Location: Left Arm, Patient Position: Sitting, Cuff Size: Normal)   Pulse 72   Ht _0  (1.575 m)   Wt 153 lb 6.4 oz (69.6 kg)   SpO2 95%   BMI 28.06 kg/m    Review of Systems He denies hypoglycemia    Objective:   Physical Exam VITAL SIGNS:  See vs page GENERAL: no distress Pulses: dorsalis pedis intact bilat.   MSK: no deformity of the feet CV: no leg edema Skin:  no ulcer on the feet.  normal color and temp on the feet. Neuro: sensation is intact to touch on the feet.   Lab Results  Component Value Date   HGBA1C 6.7 (A) 04/28/2019   Lab Results  Component Value Date   CHOL 101 04/29/2019   HDL 51.10 04/29/2019   LDLCALC 38 04/29/2019   LDLDIRECT 38.0 04/29/2019   TRIG 59.0 04/29/2019   CHOLHDL 2 04/29/2019        Assessment & Plan:  Type 2 DM: well-controlled Dyslipidemia: well-controlled NASH: recheck today  Patient Instructions  Please continue the same medications. check your blood sugar once a day.  vary the time of day when you check, between before the 3 meals, and at bedtime.  also check if you have symptoms of your blood sugar being too high or too low.  please keep a record of the readings and bring it to your next appointment here (or you can bring the meter itself).  You can write it on any piece of paper.  please call us sooner if your blood sugar goes below 70, or if you have a lot of readings over 200. Please come back for a follow-up appointment in 3-6 months.

## 2019-04-28 NOTE — Patient Instructions (Signed)
Please continue the same medications. check your blood sugar once a day.  vary the time of day when you check, between before the 3 meals, and at bedtime.  also check if you have symptoms of your blood sugar being too high or too low.  please keep a record of the readings and bring it to your next appointment here (or you can bring the meter itself).  You can write it on any piece of paper.  please call us sooner if your blood sugar goes below 70, or if you have a lot of readings over 200. Please come back for a follow-up appointment in 3-6 months.

## 2019-04-28 NOTE — Telephone Encounter (Signed)
Questions for Screening COVID-19  Symptom onset: n/a  Travel or Contacts: none  During this illness, did/does the patient experience any of the following symptoms? Fever >100.33F []   Yes [x]   No []   Unknown Subjective fever (felt feverish) []   Yes [x]   No []   Unknown Chills []   Yes [x]   No []   Unknown Muscle aches (myalgia) []   Yes [x]   No []   Unknown Runny nose (rhinorrhea) []   Yes [x]   No []   Unknown Sore throat []   Yes [x]   No []   Unknown Cough (new onset or worsening of chronic cough) []   Yes [x]   No []   Unknown Shortness of breath (dyspnea) []   Yes [x]   No []   Unknown Nausea or vomiting []   Yes [x]   No []   Unknown Headache []   Yes [x]   No []   Unknown Abdominal pain  []   Yes [x]   No []   Unknown Diarrhea (?3 loose/looser than normal stools/24hr period) []   Yes [x]   No []   Unknown Other, specify:

## 2019-04-29 ENCOUNTER — Ambulatory Visit (INDEPENDENT_AMBULATORY_CARE_PROVIDER_SITE_OTHER): Payer: No Typology Code available for payment source | Admitting: Family Medicine

## 2019-04-29 ENCOUNTER — Encounter: Payer: Self-pay | Admitting: Family Medicine

## 2019-04-29 VITALS — BP 120/70 | HR 76 | Ht 62.0 in | Wt 153.0 lb

## 2019-04-29 DIAGNOSIS — Z0000 Encounter for general adult medical examination without abnormal findings: Secondary | ICD-10-CM

## 2019-04-29 DIAGNOSIS — I10 Essential (primary) hypertension: Secondary | ICD-10-CM | POA: Diagnosis not present

## 2019-04-29 DIAGNOSIS — E7800 Pure hypercholesterolemia, unspecified: Secondary | ICD-10-CM | POA: Diagnosis not present

## 2019-04-29 LAB — URINALYSIS, ROUTINE W REFLEX MICROSCOPIC
Bilirubin Urine: NEGATIVE
Hgb urine dipstick: NEGATIVE
Ketones, ur: NEGATIVE
Leukocytes,Ua: NEGATIVE
Nitrite: NEGATIVE
RBC / HPF: NONE SEEN (ref 0–?)
Specific Gravity, Urine: <=1.005 — AB (ref 1.000–1.030)
Total Protein, Urine: NEGATIVE
Urine Glucose: >=1000 — AB
Urobilinogen, UA: 0.2 (ref 0.0–1.0)
WBC, UA: NONE SEEN (ref 0–?)
pH: 6 (ref 5.0–8.0)

## 2019-04-29 LAB — LIPID PANEL
Cholesterol: 101 mg/dL (ref 0–200)
HDL: 51.1 mg/dL (ref >39.00)
LDL Cholesterol: 38 mg/dL (ref 0–99)
NonHDL: 50.2
Total CHOL/HDL Ratio: 2
Triglycerides: 59 mg/dL (ref 0.0–149.0)
VLDL: 11.8 mg/dL (ref 0.0–40.0)

## 2019-04-29 LAB — MICROALBUMIN / CREATININE URINE RATIO
Creatinine,U: 21.1 mg/dL
Microalb Creat Ratio: 3.3 mg/g (ref 0.0–30.0)
Microalb, Ur: <0.7 mg/dL (ref 0.0–1.9)

## 2019-04-29 LAB — COMPREHENSIVE METABOLIC PANEL
ALT: 23 U/L (ref 0–53)
AST: 25 U/L (ref 0–37)
Albumin: 4.4 g/dL (ref 3.5–5.2)
Alkaline Phosphatase: 54 U/L (ref 39–117)
BUN: 13 mg/dL (ref 6–23)
CO2: 28 mEq/L (ref 19–32)
Calcium: 9.2 mg/dL (ref 8.4–10.5)
Chloride: 103 mEq/L (ref 96–112)
Creatinine, Ser: 1.1 mg/dL (ref 0.40–1.50)
GFR: 67.35 mL/min (ref >60.00)
Glucose, Bld: 134 mg/dL — ABNORMAL HIGH (ref 70–99)
Potassium: 3.8 mEq/L (ref 3.5–5.1)
Sodium: 139 mEq/L (ref 135–145)
Total Bilirubin: 1.2 mg/dL (ref 0.2–1.2)
Total Protein: 6.8 g/dL (ref 6.0–8.3)

## 2019-04-29 LAB — CBC
HCT: 43.6 % (ref 39.0–52.0)
Hemoglobin: 14.3 g/dL (ref 13.0–17.0)
MCHC: 32.9 g/dL (ref 30.0–36.0)
MCV: 96.5 fl (ref 78.0–100.0)
Platelets: 170 10*3/uL (ref 150.0–400.0)
RBC: 4.52 Mil/uL (ref 4.22–5.81)
RDW: 13.2 % (ref 11.5–15.5)
WBC: 5.2 10*3/uL (ref 4.0–10.5)

## 2019-04-29 LAB — LDL CHOLESTEROL, DIRECT: Direct LDL: 38 mg/dL

## 2019-04-29 LAB — PSA: PSA: 1.47 ng/mL (ref 0.10–4.00)

## 2019-04-29 MED ORDER — ATORVASTATIN CALCIUM 40 MG PO TABS: 40.0000 mg | ORAL_TABLET | Freq: Every day | ORAL | 3 refills | Status: DC

## 2019-04-29 MED ORDER — LISINOPRIL 5 MG PO TABS: ORAL_TABLET | ORAL | 2 refills | Status: DC

## 2019-04-29 NOTE — Patient Instructions (Signed)
Health Maintenance, Male Adopting a healthy lifestyle and getting preventive care are important in promoting health and wellness. Ask your health care provider about:  The right schedule for you to have regular tests and exams.  Things you can do on your own to prevent diseases and keep yourself healthy. What should I know about diet, weight, and exercise? Eat a healthy diet   Eat a diet that includes plenty of vegetables, fruits, low-fat dairy products, and lean protein.  Do not eat a lot of foods that are high in solid fats, added sugars, or sodium. Maintain a healthy weight Body mass index (BMI) is a measurement that can be used to identify possible weight problems. It estimates body fat based on height and weight. Your health care provider can help determine your BMI and help you achieve or maintain a healthy weight. Get regular exercise Get regular exercise. This is one of the most important things you can do for your health. Most adults should:  Exercise for at least 150 minutes each week. The exercise should increase your heart rate and make you sweat (moderate-intensity exercise).  Do strengthening exercises at least twice a week. This is in addition to the moderate-intensity exercise.  Spend less time sitting. Even light physical activity can be beneficial. Watch cholesterol and blood lipids Have your blood tested for lipids and cholesterol at 64 years of age, then have this test every 5 years. You may need to have your cholesterol levels checked more often if:  Your lipid or cholesterol levels are high.  You are older than 64 years of age.  You are at high risk for heart disease. What should I know about cancer screening? Many types of cancers can be detected early and may often be prevented. Depending on your health history and family history, you may need to have cancer screening at various ages. This may include screening for:  Colorectal cancer.  Prostate cancer.   Skin cancer.  Lung cancer. What should I know about heart disease, diabetes, and high blood pressure? Blood pressure and heart disease  High blood pressure causes heart disease and increases the risk of stroke. This is more likely to develop in people who have high blood pressure readings, are of African descent, or are overweight.  Talk with your health care provider about your target blood pressure readings.  Have your blood pressure checked: ? Every 3-5 years if you are 18-39 years of age. ? Every year if you are 40 years old or older.  If you are between the ages of 65 and 75 and are a current or former smoker, ask your health care provider if you should have a one-time screening for abdominal aortic aneurysm (AAA). Diabetes Have regular diabetes screenings. This checks your fasting blood sugar level. Have the screening done:  Once every three years after age 45 if you are at a normal weight and have a low risk for diabetes.  More often and at a younger age if you are overweight or have a high risk for diabetes. What should I know about preventing infection? Hepatitis B If you have a higher risk for hepatitis B, you should be screened for this virus. Talk with your health care provider to find out if you are at risk for hepatitis B infection. Hepatitis C Blood testing is recommended for:  Everyone born from 1945 through 1965.  Anyone with known risk factors for hepatitis C. Sexually transmitted infections (STIs)  You should be screened each year   for STIs, including gonorrhea and chlamydia, if: ? You are sexually active and are younger than 64 years of age. ? You are older than 64 years of age and your health care provider tells you that you are at risk for this type of infection. ? Your sexual activity has changed since you were last screened, and you are at increased risk for chlamydia or gonorrhea. Ask your health care provider if you are at risk.  Ask your health care  provider about whether you are at high risk for HIV. Your health care provider may recommend a prescription medicine to help prevent HIV infection. If you choose to take medicine to prevent HIV, you should first get tested for HIV. You should then be tested every 3 months for as long as you are taking the medicine. Follow these instructions at home: Lifestyle  Do not use any products that contain nicotine or tobacco, such as cigarettes, e-cigarettes, and chewing tobacco. If you need help quitting, ask your health care provider.  Do not use street drugs.  Do not share needles.  Ask your health care provider for help if you need support or information about quitting drugs. Alcohol use  Do not drink alcohol if your health care provider tells you not to drink.  If you drink alcohol: ? Limit how much you have to 0-2 drinks a day. ? Be aware of how much alcohol is in your drink. In the [place] one drink equals one 12 oz bottle of beer (355 mL), one 5 oz glass of wine (148 mL), or one 1 oz glass of hard liquor (44 mL). General instructions  Schedule regular health, dental, and eye exams.  Stay current with your vaccines.  Tell your health care provider if: ? You often feel depressed. ? You have ever been abused or do not feel safe at home. Summary  Adopting a healthy lifestyle and getting preventive care are important in promoting health and wellness.  Follow your health care provider's instructions about healthy diet, exercising, and getting tested or screened for diseases.  Follow your health care provider's instructions on monitoring your cholesterol and blood pressure. This information is not intended to replace advice given to you by your health care provider. Make sure you discuss any questions you have with your health care provider. Document Released: 02/15/2008 Document Revised: 08/12/2018 Document Reviewed: 08/12/2018 Elsevier Patient Education  2020 Else[employer]  Preventive  Care 86-27 Years Old, Male Preventive care refers to lifestyle choices and visits with your health care provider that can promote health and wellness. This includes:  A yearly physical exam. This is also called an annual well check.  Regular dental and eye exams.  Immunizations.  Screening for certain conditions.  Healthy lifestyle choices, such as eating a healthy diet, getting regular exercise, not using drugs or products that contain nicotine and tobacco, and limiting alcohol use. What can I expect for my preventive care visit? Physical exam Your health care provider will check:  Height and weight. These may be used to calculate body mass index (BMI), which is a measurement that tells if you are at a healthy weight.  Heart rate and blood pressure.  Your skin for abnormal spots. Counseling Your health care provider may ask you questions about:  Alcohol, tobacco, and drug use.  Emotional well-being.  Home and relationship well-being.  Sexual activity.  Eating habits.  Work and work Statistician. What immunizations do I need?  Influenza (flu) vaccine  This is recommended every  year. Tetanus, diphtheria, and pertussis (Tdap) vaccine  You may need a Td booster every 10 years. Varicella (chickenpox) vaccine  You may need this vaccine if you have not already been vaccinated. Zoster (shingles) vaccine  You may need this after age 70. Measles, mumps, and rubella (MMR) vaccine  You may need at least one dose of MMR if you were born in 1957 or later. You may also need a second dose. Pneumococcal conjugate (PCV13) vaccine  You may need this if you have certain conditions and were not previously vaccinated. Pneumococcal polysaccharide (PPSV23) vaccine  You may need one or two doses if you smoke cigarettes or if you have certain conditions. Meningococcal conjugate (MenACWY) vaccine  You may need this if you have certain conditions. Hepatitis A vaccine  You may need  this if you have certain conditions or if you travel or work in places where you may be exposed to hepatitis A. Hepatitis B vaccine  You may need this if you have certain conditions or if you travel or work in places where you may be exposed to hepatitis B. Haemophilus influenzae type b (Hib) vaccine  You may need this if you have certain risk factors. Human papillomavirus (HPV) vaccine  If recommended by your health care provider, you may need three doses over 6 months. You may receive vaccines as individual doses or as more than one vaccine together in one shot (combination vaccines). Talk with your health care provider about the risks and benefits of combination vaccines. What tests do I need? Blood tests  Lipid and cholesterol levels. These may be checked every 5 years, or more frequently if you are over 25 years old.  Hepatitis C test.  Hepatitis B test. Screening  Lung cancer screening. You may have this screening every year starting at age 40 if you have a 30-pack-year history of smoking and currently smoke or have quit within the past 15 years.  Prostate cancer screening. Recommendations will vary depending on your family history and other risks.  Colorectal cancer screening. All adults should have this screening starting at age 77 and continuing until age 10. Your health care provider may recommend screening at age 33 if you are at increased risk. You will have tests every 1-10 years, depending on your results and the type of screening test.  Diabetes screening. This is done by checking your blood sugar (glucose) after you have not eaten for a while (fasting). You may have this done every 1-3 years.  Sexually transmitted disease (STD) testing. Follow these instructions at home: Eating and drinking  Eat a diet that includes fresh fruits and vegetables, whole grains, lean protein, and low-fat dairy products.  Take vitamin and mineral supplements as recommended by your health  care provider.  Do not drink alcohol if your health care provider tells you not to drink.  If you drink alcohol: ? Limit how much you have to 0-2 drinks a day. ? Be aware of how much alcohol is in your drink. In the [place] one drink equals one 12 oz bottle of beer (355 mL), one 5 oz glass of wine (148 mL), or one 1 oz glass of hard liquor (44 mL). Lifestyle  Take daily care of your teeth and gums.  Stay active. Exercise for at least 30 minutes on 5 or more days each week.  Do not use any products that contain nicotine or tobacco, such as cigarettes, e-cigarettes, and chewing tobacco. If you need help quitting, ask your health care  provider.  If you are sexually active, practice safe sex. Use a condom or other form of protection to prevent STIs (sexually transmitted infections).  Talk with your health care provider about taking a low-dose aspirin every day starting at age 50. What's next?  Go to your health care provider once a year for a well check visit.  Ask your health care provider how often you should have your eyes and teeth checked.  Stay up to date on all vaccines. This information is not intended to replace advice given to you by your health care provider. Make sure you discuss any questions you have with your health care provider. Document Released: 09/15/2015 Document Revised: 08/13/2018 Document Reviewed: 08/13/2018 Elsevier Patient Education  2020 Elsevier Inc.  

## 2019-04-29 NOTE — Progress Notes (Signed)
Established Patient Office Visit  Subjective:  Patient ID: Elijah Cervantes, male    DOB: 10/23/1955  Age: 63 y.o. MRN: [id number]  CC:  Chief Complaint  Patient presents with  . Annual Exam    HPI Elijah Cervantes presents for complete physical exam he is fasting this morning.  Continues to see Dr. Loanne Drilling for his diabetes and is well controlled with his last hemoglobin A1c at 6.7.  His eyes are checked every November.  His dental exam is scheduled for next week.  He continues to exercise by doing calisthenics and weight lifting at his house.  Cholesterol has been controlled with Lipitor.  Blood pressures controlled with lisinopril having no issues taking these medications.  Urine flow has been good.  Past Medical History:  Diagnosis Date  . ABNORMAL ELECTROCARDIOGRAM 07/26/2009  . DEPRESSION 03/31/2007  . DIABETES MELLITUS, TYPE II 03/31/2007  . Dyspepsia   . 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 FATTY LIVER DISEASE 07/18/2008  . GERD 07/26/2009  . HSV-1 infection   . HYPERCHOLESTEROLEMIA 10/31/2010  . HYPERTENSION 03/31/2007  . NASH (nonalcoholic steatohepatitis)   . Personal history of colonic adenoma 02/10/2013    Past Surgical History:  Procedure Laterality Date  . COLONOSCOPY     2014 1 adenoma 2019 none  . hernia repair    . Stress Cardiolite  11/22/2003    Family History  Problem Relation Age of Onset  . Diabetes Mother   . Diabetes Brother   . Cancer Neg Hx   . Colon cancer Neg Hx   . Esophageal cancer Neg Hx   . Stomach cancer Neg Hx   . Rectal cancer Neg Hx   . Colon polyps Neg Hx     Social History   Socioeconomic History  . Marital status: Married    Spouse name: Not on file  . Number of children: Not on file  . Years of education: Not on file  . Highest education level: Not on file  Occupational History  . Occupation: Unemployed  Social Needs  . Financial resource strain: Not on file  . Food insecurity    Worry: Not on file    Inability:  Not on file  . Transportation needs    Medical: Not on file    Non-medical: Not on file  Tobacco Use  . Smoking status: Never Smoker  . Smokeless tobacco: Never Used  Substance and Sexual Activity  . Alcohol use: Yes    Comment: occasional use  . Drug use: No  . Sexual activity: Not on file  Lifestyle  . Physical activity    Days per week: Not on file    Minutes per session: Not on file  . Stress: Not on file  Relationships  . Social Herbalist on phone: Not on file    Gets together: Not on file    Attends religious service: Not on file    Active member of club or organization: Not on file    Attends meetings of clubs or organizations: Not on file    Relationship status: Not on file  . Intimate partner violence    Fear of current or ex partner: Not on file    Emotionally abused: Not on file    Physically abused: Not on file    Forced sexual activity: Not on file  Other Topics Concern  . Not on file  Social History Narrative  . Not on file    Outpatient Medications Prior  to Visit  Medication Sig Dispense Refill  . aspirin 81 MG tablet Take 1 tablet (81 mg total) by mouth daily. 30 tablet 0  . bromocriptine (PARLODEL) 2.5 MG tablet Take 1 tablet (2.5 mg total) by mouth daily. 90 tablet 3  . canagliflozin (INVOKANA) 100 MG TABS tablet TAKE 1 TABLET (100 MG TOTAL) BY MOUTH DAILY. 90 tablet 3  . glucose blood (FREESTYLE TEST STRIPS) test strip Use to test blood sugar once daily 100 each 3  . glucose monitoring kit (FREESTYLE) monitoring kit 1 each by Does not apply route as needed for other. 1 each 0  . JANUVIA 100 MG tablet Take 1 tablet (100 mg total) by mouth daily. 90 tablet 4  . Lancets (FREESTYLE) lancets Use to test blood sugar once daily 100 each 3  . methocarbamol (ROBAXIN) 500 MG tablet Take 1 tablet (500 mg total) by mouth at bedtime as needed for muscle spasms. For leg cramps 90 tablet 1  . pantoprazole (PROTONIX) 40 MG tablet Take 1 tablet (40 mg total)  by mouth daily. 90 tablet 0  . pioglitazone-metformin (ACTOPLUS MET) 15-500 MG tablet TAKE 2 TABLETS BY MOUTH EVERY MORNING AND 1 TABLET BY MOUTH EVERY EVENING 270 tablet 4  . repaglinide (PRANDIN) 2 MG tablet TAKE 2 TABLETS BY MOUTH 3 TIMES DAILY BEFORE MEALS. 540 tablet 3  . triamcinolone (KENALOG) 0.025 % ointment Apply to affected area three times a day as needed for rash 80 g 3  . atorvastatin (LIPITOR) 40 MG tablet Take 1 tablet (40 mg total) by mouth daily. 90 tablet 3  . lisinopril (ZESTRIL) 5 MG tablet TAKE 1 TABLET BY MOUTH ONCE DAILY (FUTURE REFILLS WILL NEED TO COME FROM PCP) 90 tablet 0   No facility-administered medications prior to visit.     No Known Allergies  ROS Review of Systems  Constitutional: Negative.   HENT: Negative.   Eyes: Negative for photophobia and visual disturbance.  Respiratory: Negative.   Cardiovascular: Negative.   Gastrointestinal: Negative.   Endocrine: Negative for polyphagia and polyuria.  Genitourinary: Negative for difficulty urinating, frequency and urgency.  Musculoskeletal: Negative for gait problem and joint swelling.  Skin: Negative for pallor and rash.  Allergic/Immunologic: Negative for immunocompromised state.  Neurological: Negative for headaches.  Hematological: Does not bruise/bleed easily.  Psychiatric/Behavioral: Negative.       Objective:    Physical Exam  Constitutional: He is oriented to person, place, and time. He appears well-developed and well-nourished. No distress.  HENT:  Head: Normocephalic and atraumatic.  Right Ear: External ear normal.  Left Ear: External ear normal.  Mouth/Throat: Oropharynx is clear and moist. No oropharyngeal exudate.  Eyes: Pupils are equal, round, and reactive to light. Conjunctivae are normal. Right eye exhibits no discharge. Left eye exhibits no discharge. No scleral icterus.  Neck: Neck supple. No JVD present. No tracheal deviation present. No thyromegaly present.  Cardiovascular:  Normal rate, regular rhythm and normal heart sounds.  Pulses:      Dorsalis pedis pulses are 2+ on the right side and 2+ on the left side.       Posterior tibial pulses are 1+ on the right side and 1+ on the left side.  Pulmonary/Chest: Effort normal and breath sounds normal. No stridor. No respiratory distress. He has no wheezes. He has no rales.  Abdominal: Soft. Bowel sounds are normal. He exhibits no distension and no mass. There is no abdominal tenderness. There is no rebound and no guarding. Hernia confirmed negative in  the right inguinal area and confirmed negative in the left inguinal area.  Genitourinary: Rectum:     Guaiac result negative.     No rectal mass, anal fissure, tenderness, external hemorrhoid, internal hemorrhoid or abnormal anal tone.  Prostate is enlarged. Prostate is not tender. Right testis shows no mass, no swelling and no tenderness. Right testis is descended. Left testis shows no mass, no swelling and no tenderness. Left testis is descended. Circumcised. No hypospadias, penile erythema or penile tenderness. No discharge found.  Musculoskeletal:        General: No edema.  Lymphadenopathy:    He has no cervical adenopathy.       Right: No inguinal adenopathy present.       Left: No inguinal adenopathy present.  Neurological: He is alert and oriented to person, place, and time.  Skin: Skin is warm and dry. He is not diaphoretic.  Psychiatric: He has a normal mood and affect. His behavior is normal.   Diabetic Foot Exam - Simple   Simple Foot Form Diabetic Foot exam was performed with the following findings: Yes 04/29/2019  8:25 AM  Visual Inspection See comments: Yes Sensation Testing Intact to touch and monofilament testing bilaterally: Yes Pulse Check Posterior Tibialis and Dorsalis pulse intact bilaterally: Yes Comments Feet are cavus.     BP 120/70   Pulse 76   Ht '5\' 2"'$  (1.575 m)   Wt 153 lb (69.4 kg)   SpO2 97%   BMI 27.98 kg/m  Wt Readings from  Last 3 Encounters:  04/29/19 153 lb (69.4 kg)  04/28/19 153 lb 6.4 oz (69.6 kg)  10/28/18 150 lb (68 kg)   BP Readings from Last 3 Encounters:  04/29/19 120/70  04/28/19 128/70  10/28/18 134/62   Guideline developer:  UpToDate (see UpToDate for funding source) Date Released: June 2014  There are no preventive care reminders to display for this patient.  There are no preventive care reminders to display for this patient.  Lab Results  Component Value Date   TSH 1.66 09/23/2017   Lab Results  Component Value Date   WBC 5.6 09/23/2017   HGB 15.5 09/23/2017   HCT 46.8 09/23/2017   MCV 94.8 09/23/2017   PLT 207.0 09/23/2017   Lab Results  Component Value Date   NA 138 09/23/2017   K 4.2 09/23/2017   CO2 29 09/23/2017   GLUCOSE 202 (H) 09/23/2017   BUN 17 09/23/2017   CREATININE 1.00 09/23/2017   BILITOT 0.7 09/23/2017   ALKPHOS 65 09/23/2017   AST 21 09/23/2017   ALT 28 09/23/2017   PROT 7.0 09/23/2017   ALBUMIN 4.4 09/23/2017   CALCIUM 9.6 09/23/2017   GFR 80.32 09/23/2017   Lab Results  Component Value Date   CHOL 146 09/23/2017   Lab Results  Component Value Date   HDL 44.30 09/23/2017   Lab Results  Component Value Date   LDLCALC 69 09/23/2017   Lab Results  Component Value Date   TRIG 164.0 (H) 09/23/2017   Lab Results  Component Value Date   CHOLHDL 3 09/23/2017   Lab Results  Component Value Date   HGBA1C 6.7 (A) 04/28/2019      Assessment & Plan:   Problem List Items Addressed This Visit      Cardiovascular and Mediastinum   Essential hypertension - Primary   Relevant Medications   atorvastatin (LIPITOR) 40 MG tablet   lisinopril (ZESTRIL) 5 MG tablet   Other Relevant Orders   CBC  Comprehensive metabolic panel   Urinalysis, Routine w reflex microscopic   Microalbumin / creatinine urine ratio     Other   HYPERCHOLESTEROLEMIA   Relevant Medications   atorvastatin (LIPITOR) 40 MG tablet   lisinopril (ZESTRIL) 5 MG tablet    Other Relevant Orders   Comprehensive metabolic panel   LDL cholesterol, direct   Lipid panel    Other Visit Diagnoses    Healthcare maintenance       Relevant Orders   PSA      Meds ordered this encounter  Medications  . atorvastatin (LIPITOR) 40 MG tablet    Sig: Take 1 tablet (40 mg total) by mouth daily.    Dispense:  90 tablet    Refill:  3  . lisinopril (ZESTRIL) 5 MG tablet    Sig: TAKE 1 TABLET BY MOUTH ONCE DAILY (FUTURE REFILLS WILL NEED TO COME FROM PCP)    Dispense:  90 tablet    Refill:  2    Follow-up: Return in about 6 months (around 10/30/2019).

## 2019-05-13 ENCOUNTER — Other Ambulatory Visit: Payer: Self-pay | Admitting: Endocrinology

## 2019-05-13 MED FILL — JANUVIA 100 MG TABLET: 100 | 60 days supply | Qty: 60 | Fill #0

## 2019-05-28 ENCOUNTER — Other Ambulatory Visit: Payer: Self-pay | Admitting: Endocrinology

## 2019-05-28 MED FILL — BROMOCRIPTINE 2.5 MG TABLET: 2.5 | 90 days supply | Qty: 90 | Fill #0

## 2019-05-28 MED FILL — PIOGLIT-METFORMIN 15-500: 15-500 | 90 days supply | Qty: 270 | Fill #0

## 2019-05-28 MED FILL — ATORVASTATIN 40 MG TABLET: 40 | 90 days supply | Qty: 90 | Fill #0

## 2019-05-28 MED FILL — INVOKANA 100 MG TABLET: 100 | 90 days supply | Qty: 90 | Fill #0

## 2019-07-06 MED FILL — OMRON 3 SERIES BP MONITOR D: 20 days supply | Qty: 1 | Fill #0

## 2019-07-15 MED FILL — JANUVIA 100 MG TABLET: 100 | 60 days supply | Qty: 60 | Fill #1

## 2019-07-16 LAB — HM DIABETES EYE EXAM

## 2019-08-04 MED FILL — REPAGLINIDE 2 MG TABS: 2 | 90 days supply | Qty: 540 | Fill #1

## 2019-08-20 ENCOUNTER — Other Ambulatory Visit: Payer: Self-pay | Admitting: Endocrinology

## 2019-08-20 MED FILL — BROMOCRIPTINE 2.5 MG TABLET: 2.5 | 90 days supply | Qty: 90 | Fill #1

## 2019-08-20 MED FILL — INVOKANA 100 MG TABLET: 100 | 90 days supply | Qty: 90 | Fill #1

## 2019-08-20 MED FILL — ATORVASTATIN 40 MG TABLET: 40 | 90 days supply | Qty: 90 | Fill #1

## 2019-08-20 MED FILL — PIOGLIT-METFORMIN 15-500: 15-500 | 90 days supply | Qty: 270 | Fill #1

## 2019-08-20 NOTE — Telephone Encounter (Signed)
Please forward refill request to pt's primary care provider.   

## 2019-08-20 NOTE — Telephone Encounter (Signed)
Please advise 

## 2019-08-23 MED FILL — METHOCARBAMOL 500 MG TABS: 500 | 90 days supply | Qty: 90 | Fill #0

## 2019-08-23 NOTE — Telephone Encounter (Signed)
Per Dr. Ellison's request, I am forwarding this refill request. Please review and refill if appropriate.  

## 2019-09-13 ENCOUNTER — Other Ambulatory Visit: Payer: Self-pay | Admitting: Family Medicine

## 2019-09-13 MED FILL — JANUVIA 100 MG TABLET: 100 | 60 days supply | Qty: 60 | Fill #2

## 2019-09-13 MED FILL — PANTOPRAZOLE SOD DR 40 MG T: 40 | 90 days supply | Qty: 90 | Fill #0

## 2019-10-26 ENCOUNTER — Encounter: Payer: Self-pay | Admitting: Endocrinology

## 2019-10-26 ENCOUNTER — Ambulatory Visit: Payer: No Typology Code available for payment source | Admitting: Endocrinology

## 2019-10-26 ENCOUNTER — Other Ambulatory Visit: Payer: Self-pay

## 2019-10-26 ENCOUNTER — Ambulatory Visit (INDEPENDENT_AMBULATORY_CARE_PROVIDER_SITE_OTHER): Payer: No Typology Code available for payment source | Admitting: Endocrinology

## 2019-10-26 VITALS — BP 130/72 | HR 76 | Ht 62.0 in | Wt 144.4 lb

## 2019-10-26 DIAGNOSIS — E119 Type 2 diabetes mellitus without complications: Secondary | ICD-10-CM | POA: Diagnosis not present

## 2019-10-26 LAB — POCT GLYCOSYLATED HEMOGLOBIN (HGB A1C): Hemoglobin A1C: 7.2 % — AB (ref 4.0–5.6)

## 2019-10-26 MED ORDER — RYBELSUS 3 MG PO TABS: 3.0000 mg | ORAL_TABLET | Freq: Every day | ORAL | 3 refills | Status: DC

## 2019-10-26 MED FILL — RYBELSUS 3 MG TABS: 3 | 90 days supply | Qty: 90 | Fill #0

## 2019-10-26 NOTE — Progress Notes (Signed)
Subjective:    Patient ID: Elijah Cervantes, male    DOB: 07/15/1956, 64 y.o.   MRN: [id number]  HPI Pt returns for f/u of diabetes mellitus:  DM type: 2 Dx'ed: 1224 Complications: none Therapy: 6 oral meds DKA: never Severe hypoglycemia: never.   Pancreatitis: never.   [place]: he works 3rd shift Other: he has declined acarbose and welchol; he declines insulin; however, wife is Therapist, sports, so he could take in emergency if necessary.   Interval history: He brings a record of his cbg's which I have reviewed today.  cbg varies from 57-103.  pt states he feels well in general.   Past Medical History:  Diagnosis Date  . ABNORMAL ELECTROCARDIOGRAM 07/26/2009  . DEPRESSION 03/31/2007  . DIABETES MELLITUS, TYPE II 03/31/2007  . Dyspepsia   . 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 FATTY LIVER DISEASE 07/18/2008  . GERD 07/26/2009  . HSV-1 infection   . HYPERCHOLESTEROLEMIA 10/31/2010  . HYPERTENSION 03/31/2007  . NASH (nonalcoholic steatohepatitis)   . Personal history of colonic adenoma 02/10/2013    Past Surgical History:  Procedure Laterality Date  . COLONOSCOPY     2014 1 adenoma 2019 none  . hernia repair    . Stress Cardiolite  11/22/2003    Social History   Socioeconomic History  . Marital status: Married    Spouse name: Not on file  . Number of children: Not on file  . Years of education: Not on file  . Highest education level: Not on file  Occupational History  . Occupation: Unemployed  Tobacco Use  . Smoking status: Never Smoker  . Smokeless tobacco: Never Used  Substance and Sexual Activity  . Alcohol use: Yes    Comment: rarely  . Drug use: No  . Sexual activity: Not on file  Other Topics Concern  . Not on file  Social History Narrative  . Not on file   Social Determinants of Health   Financial Resource Strain:   . Difficulty of Paying Living Expenses: Not on file  Food Insecurity:   . Worried About Charity fundraiser in the Last Year: Not on file  . Ran  Out of Food in the Last Year: Not on file  Transportation Needs:   . Lack of Transportation (Medical): Not on file  . Lack of Transportation (Non-Medical): Not on file  Physical Activity:   . Days of Exercise per Week: Not on file  . Minutes of Exercise per Session: Not on file  Stress:   . Feeling of Stress : Not on file  Social Connections:   . Frequency of Communication with Friends and Family: Not on file  . Frequency of Social Gatherings with Friends and Family: Not on file  . Attends Religious Services: Not on file  . Active Member of Clubs or Organizations: Not on file  . Attends Archivist Meetings: Not on file  . Marital Status: Not on file  Intimate Partner Violence:   . Fear of Current or Ex-Partner: Not on file  . Emotionally Abused: Not on file  . Physically Abused: Not on file  . Sexually Abused: Not on file    Current Outpatient Medications on File Prior to Visit  Medication Sig Dispense Refill  . aspirin 81 MG tablet Take 1 tablet (81 mg total) by mouth daily. 30 tablet 0  . bromocriptine (PARLODEL) 2.5 MG tablet TAKE 1 TABLET BY MOUTH DAILY. 90 tablet 3  . glucose blood (FREESTYLE TEST STRIPS) test  strip Use to test blood sugar once daily 100 each 3  . glucose monitoring kit (FREESTYLE) monitoring kit 1 each by Does not apply route as needed for other. 1 each 0  . INVOKANA 100 MG TABS tablet TAKE 1 TABLET BY MOUTH DAILY. 90 tablet 3  . Lancets (FREESTYLE) lancets Use to test blood sugar once daily 100 each 3  . methocarbamol (ROBAXIN) 500 MG tablet TAKE 1 TABLET BY MOUTH AT BEDTIME AS NEEDED FOR MUSCLE SPASMS AND FOR LEG CRAMPS (Patient not taking: Reported on 10/28/2019) 90 tablet 1  . pantoprazole (PROTONIX) 40 MG tablet TAKE 1 TABLET BY MOUTH ONCE A DAY (Patient not taking: Reported on 10/28/2019) 90 tablet 0  . pioglitazone-metformin (ACTOPLUS MET) 15-500 MG tablet TAKE 2 TABLETS BY MOUTH EVERY MORNING AND 1 TABLET BY MOUTH EVERY EVENING 270 tablet 4  .  repaglinide (PRANDIN) 2 MG tablet TAKE 2 TABLETS BY MOUTH 3 TIMES DAILY BEFORE MEALS. 540 tablet 3  . triamcinolone (KENALOG) 0.025 % ointment Apply to affected area three times a day as needed for rash 80 g 3   No current facility-administered medications on file prior to visit.    No Known Allergies  Family History  Problem Relation Age of Onset  . Diabetes Mother   . Diabetes Brother   . Cancer Neg Hx   . Colon cancer Neg Hx   . Esophageal cancer Neg Hx   . Stomach cancer Neg Hx   . Rectal cancer Neg Hx   . Colon polyps Neg Hx     BP 130/72 (BP Location: Left Arm, Patient Position: Sitting, Cuff Size: Normal)   Pulse 76   Ht _0  (1.575 m)   Wt 144 lb 6.4 oz (65.5 kg)   SpO2 95%   BMI 26.41 kg/m    Review of Systems Denies nausea and LOC.     Objective:   Physical Exam VITAL SIGNS:  See vs page GENERAL: no distress Pulses: dorsalis pedis intact bilat.   MSK: no deformity of the feet CV: trace bilat leg edema, and bilat vv's.   Skin:  no ulcer on the feet.  normal color and temp on the feet. Neuro: sensation is intact to touch on the feet  Lab Results  Component Value Date   CREATININE 1.10 04/29/2019   BUN 13 04/29/2019   NA 139 04/29/2019   K 3.8 04/29/2019   CL 103 04/29/2019   CO2 28 04/29/2019   Lab Results  Component Value Date   HGBA1C 7.2 (A) 10/26/2019       Assessment & Plan:  Type 2 DM: worse Hypoglycemia: this limits aggressiveness of glycemic control.  Goal A1c is <7%  Patient Instructions  I have sent a prescription to your pharmacy, to change Januvia to Rybelsus.  Please continue the same other medications.   check your blood sugar once a day.  vary the time of day when you check, between before the 3 meals, and at bedtime.  also check if you have symptoms of your blood sugar being too high or too low.  please keep a record of the readings and bring it to your next appointment here (or you can bring the meter itself).  You can write it  on any piece of paper.  please call us sooner if your blood sugar goes below 70, or if you have a lot of readings over 200. Please come back for a follow-up appointment in 2 months.

## 2019-10-26 NOTE — Patient Instructions (Addendum)
I have sent a prescription to your pharmacy, to change Januvia to Rybelsus.  Please continue the same other medications.   check your blood sugar once a day.  vary the time of day when you check, between before the 3 meals, and at bedtime.  also check if you have symptoms of your blood sugar being too high or too low.  please keep a record of the readings and bring it to your next appointment here (or you can bring the meter itself).  You can write it on any piece of paper.  please call us sooner if your blood sugar goes below 70, or if you have a lot of readings over 200. Please come back for a follow-up appointment in 2 months.

## 2019-10-27 ENCOUNTER — Other Ambulatory Visit: Payer: Self-pay

## 2019-10-27 ENCOUNTER — Ambulatory Visit: Payer: No Typology Code available for payment source | Admitting: Family Medicine

## 2019-10-28 ENCOUNTER — Other Ambulatory Visit: Payer: Self-pay | Admitting: Family Medicine

## 2019-10-28 ENCOUNTER — Encounter: Payer: Self-pay | Admitting: Family Medicine

## 2019-10-28 ENCOUNTER — Ambulatory Visit (INDEPENDENT_AMBULATORY_CARE_PROVIDER_SITE_OTHER): Payer: No Typology Code available for payment source | Admitting: Family Medicine

## 2019-10-28 VITALS — BP 122/68 | HR 74 | Temp 96.9°F | Ht 62.0 in | Wt 141.6 lb

## 2019-10-28 DIAGNOSIS — Z0000 Encounter for general adult medical examination without abnormal findings: Secondary | ICD-10-CM

## 2019-10-28 DIAGNOSIS — E7800 Pure hypercholesterolemia, unspecified: Secondary | ICD-10-CM

## 2019-10-28 DIAGNOSIS — I10 Essential (primary) hypertension: Secondary | ICD-10-CM | POA: Diagnosis not present

## 2019-10-28 DIAGNOSIS — K5901 Slow transit constipation: Secondary | ICD-10-CM | POA: Diagnosis not present

## 2019-10-28 DIAGNOSIS — R1013 Epigastric pain: Secondary | ICD-10-CM | POA: Diagnosis not present

## 2019-10-28 LAB — URINALYSIS, ROUTINE W REFLEX MICROSCOPIC
Bilirubin Urine: NEGATIVE
Hgb urine dipstick: NEGATIVE
Ketones, ur: NEGATIVE
Leukocytes,Ua: NEGATIVE
Nitrite: NEGATIVE
RBC / HPF: NONE SEEN (ref 0–?)
Specific Gravity, Urine: 1.02 (ref 1.000–1.030)
Total Protein, Urine: NEGATIVE
Urine Glucose: >=1000 — AB
Urobilinogen, UA: 0.2 (ref 0.0–1.0)
WBC, UA: NONE SEEN (ref 0–?)
pH: 5.5 (ref 5.0–8.0)

## 2019-10-28 LAB — COMPREHENSIVE METABOLIC PANEL
ALT: 24 U/L (ref 0–53)
AST: 24 U/L (ref 0–37)
Albumin: 4.6 g/dL (ref 3.5–5.2)
Alkaline Phosphatase: 61 U/L (ref 39–117)
BUN: 19 mg/dL (ref 6–23)
CO2: 31 mEq/L (ref 19–32)
Calcium: 10.3 mg/dL (ref 8.4–10.5)
Chloride: 102 mEq/L (ref 96–112)
Creatinine, Ser: 1.06 mg/dL (ref 0.40–1.50)
GFR: 70.18 mL/min (ref >60.00)
Glucose, Bld: 78 mg/dL (ref 70–99)
Potassium: 4.3 mEq/L (ref 3.5–5.1)
Sodium: 140 mEq/L (ref 135–145)
Total Bilirubin: 0.8 mg/dL (ref 0.2–1.2)
Total Protein: 7.3 g/dL (ref 6.0–8.3)

## 2019-10-28 LAB — LDL CHOLESTEROL, DIRECT: Direct LDL: 48 mg/dL

## 2019-10-28 LAB — CBC
HCT: 43.6 % (ref 39.0–52.0)
Hemoglobin: 14.6 g/dL (ref 13.0–17.0)
MCHC: 33.5 g/dL (ref 30.0–36.0)
MCV: 97.2 fl (ref 78.0–100.0)
Platelets: 197 10*3/uL (ref 150.0–400.0)
RBC: 4.49 Mil/uL (ref 4.22–5.81)
RDW: 12.7 % (ref 11.5–15.5)
WBC: 5.1 10*3/uL (ref 4.0–10.5)

## 2019-10-28 LAB — LIPID PANEL
Cholesterol: 124 mg/dL (ref 0–200)
HDL: 59 mg/dL (ref >39.00)
LDL Cholesterol: 55 mg/dL (ref 0–99)
NonHDL: 65.18
Total CHOL/HDL Ratio: 2
Triglycerides: 51 mg/dL (ref 0.0–149.0)
VLDL: 10.2 mg/dL (ref 0.0–40.0)

## 2019-10-28 LAB — AMYLASE: Amylase: 104 U/L (ref 27–131)

## 2019-10-28 LAB — LIPASE: Lipase: 33 U/L (ref 11.0–59.0)

## 2019-10-28 MED ORDER — DOCUSATE SODIUM 100 MG PO CAPS: 100.0000 mg | ORAL_CAPSULE | Freq: Two times a day (BID) | ORAL | 0 refills | Status: DC

## 2019-10-28 MED ORDER — LISINOPRIL 5 MG PO TABS: ORAL_TABLET | ORAL | 2 refills | Status: DC

## 2019-10-28 MED ORDER — ATORVASTATIN CALCIUM 40 MG PO TABS: 40.0000 mg | ORAL_TABLET | Freq: Every day | ORAL | 3 refills | Status: DC

## 2019-10-28 MED FILL — LISINOPRIL 5 MG TABS: 5 | 90 days supply | Qty: 90 | Fill #0

## 2019-10-28 NOTE — Progress Notes (Signed)
Established Patient Office Visit  Subjective:  Patient ID: Elijah Cervantes, male    DOB: 1956-03-30  Age: 64 y.o. MRN: [id number]  CC:  Chief Complaint  Patient presents with  . Follow-up    6 month follow up on diabetes and cholesterol    HPI Elijah Cervantes presents for follow-up of his hypertension, elevated cholesterol and midepigastric pain.  Blood pressure and cholesterol have been well controlled with the lisinopril and atorvastatin he is having no issues taking these medications.  For the last few months he has had intermittent midepigastric pain associated with eating.  He has tried Protonix with some relief but it has persisted.  He has been lost some weight unintentionally.  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  Stools every other day and often his stools are hard.  Past Medical History:  Diagnosis Date  . ABNORMAL ELECTROCARDIOGRAM 07/26/2009  . DEPRESSION 03/31/2007  . DIABETES MELLITUS, TYPE II 03/31/2007  . Dyspepsia   . 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 FATTY LIVER DISEASE 07/18/2008  . GERD 07/26/2009  . HSV-1 infection   . HYPERCHOLESTEROLEMIA 10/31/2010  . HYPERTENSION 03/31/2007  . NASH (nonalcoholic steatohepatitis)   . Personal history of colonic adenoma 02/10/2013    Past Surgical History:  Procedure Laterality Date  . COLONOSCOPY     2014 1 adenoma 2019 none  . hernia repair    . Stress Cardiolite  11/22/2003    Family History  Problem Relation Age of Onset  . Diabetes Mother   . Diabetes Brother   . Cancer Neg Hx   . Colon cancer Neg Hx   . Esophageal cancer Neg Hx   . Stomach cancer Neg Hx   . Rectal cancer Neg Hx   . Colon polyps Neg Hx     Social History   Socioeconomic History  . Marital status: Married    Spouse name: Not on file  . Number of children: Not on file  . Years of education: Not on file  . Highest education level: Not on file  Occupational History   . Occupation: Unemployed  Tobacco Use  . Smoking status: Never Smoker  . Smokeless tobacco: Never Used  Substance and Sexual Activity  . Alcohol use: Yes    Comment: rarely  . Drug use: No  . Sexual activity: Not on file  Other Topics Concern  . Not on file  Social History Narrative  . Not on file   Social Determinants of Health   Financial Resource Strain:   . Difficulty of Paying Living Expenses: Not on file  Food Insecurity:   . Worried About Charity fundraiser in the Last Year: Not on file  . Ran Out of Food in the Last Year: Not on file  Transportation Needs:   . Lack of Transportation (Medical): Not on file  . Lack of Transportation (Non-Medical): Not on file  Physical Activity:   . Days of Exercise per Week: Not on file  . Minutes of Exercise per Session: Not on file  Stress:   . Feeling of Stress : Not on file  Social Connections:   . Frequency of Communication with Friends and Family: Not on file  . Frequency of Social Gatherings with Friends and Family: Not on file  . Attends Religious Services: Not on file  . Active Member of Clubs or Organizations: Not on file  . Attends Archivist  Meetings: Not on file  . Marital Status: Not on file  Intimate Partner Violence:   . Fear of Current or Ex-Partner: Not on file  . Emotionally Abused: Not on file  . Physically Abused: Not on file  . Sexually Abused: Not on file    Outpatient Medications Prior to Visit  Medication Sig Dispense Refill  . aspirin 81 MG tablet Take 1 tablet (81 mg total) by mouth daily. 30 tablet 0  . bromocriptine (PARLODEL) 2.5 MG tablet TAKE 1 TABLET BY MOUTH DAILY. 90 tablet 3  . glucose blood (FREESTYLE TEST STRIPS) test strip Use to test blood sugar once daily 100 each 3  . glucose monitoring kit (FREESTYLE) monitoring kit 1 each by Does not apply route as needed for other. 1 each 0  . INVOKANA 100 MG TABS tablet TAKE 1 TABLET BY MOUTH DAILY. 90 tablet 3  . Lancets (FREESTYLE)  lancets Use to test blood sugar once daily 100 each 3  . pioglitazone-metformin (ACTOPLUS MET) 15-500 MG tablet TAKE 2 TABLETS BY MOUTH EVERY MORNING AND 1 TABLET BY MOUTH EVERY EVENING 270 tablet 4  . repaglinide (PRANDIN) 2 MG tablet TAKE 2 TABLETS BY MOUTH 3 TIMES DAILY BEFORE MEALS. 540 tablet 3  . Semaglutide (RYBELSUS) 3 MG TABS Take 3 mg by mouth daily. 90 tablet 3  . triamcinolone (KENALOG) 0.025 % ointment Apply to affected area three times a day as needed for rash 80 g 3  . atorvastatin (LIPITOR) 40 MG tablet Take 1 tablet (40 mg total) by mouth daily. 90 tablet 3  . lisinopril (ZESTRIL) 5 MG tablet TAKE 1 TABLET BY MOUTH ONCE DAILY (FUTURE REFILLS WILL NEED TO COME FROM PCP) 90 tablet 2  . methocarbamol (ROBAXIN) 500 MG tablet TAKE 1 TABLET BY MOUTH AT BEDTIME AS NEEDED FOR MUSCLE SPASMS AND FOR LEG CRAMPS (Patient not taking: Reported on 10/28/2019) 90 tablet 1  . pantoprazole (PROTONIX) 40 MG tablet TAKE 1 TABLET BY MOUTH ONCE A DAY (Patient not taking: Reported on 10/28/2019) 90 tablet 0   No facility-administered medications prior to visit.    No Known Allergies  ROS Review of Systems  Constitutional: Positive for unexpected weight change. Negative for chills, diaphoresis, fatigue and fever.  HENT: Negative.   Eyes: Negative for photophobia and visual disturbance.  Respiratory: Negative.   Cardiovascular: Negative.   Gastrointestinal: Positive for abdominal pain and constipation. Negative for anal bleeding and blood in stool.  Endocrine: Negative for polyphagia and polyuria.  Genitourinary: Negative.   Musculoskeletal: Negative for gait problem and joint swelling.  Allergic/Immunologic: Negative for immunocompromised state.  Neurological: Negative for speech difficulty and light-headedness.  Hematological: Does not bruise/bleed easily.  Psychiatric/Behavioral: Negative.       Objective:    Physical Exam  Constitutional: He is oriented to person, place, and time. He  appears well-developed and well-nourished. No distress.  HENT:  Head: Normocephalic and atraumatic.  Right Ear: External ear normal.  Left Ear: External ear normal.  Eyes: Conjunctivae are normal. Right eye exhibits no discharge. Left eye exhibits no discharge. No scleral icterus.  Neck: No JVD present. No tracheal deviation present. No thyromegaly present.  Cardiovascular: Normal rate, regular rhythm and normal heart sounds.  Pulmonary/Chest: Effort normal and breath sounds normal. No stridor.  Abdominal: Soft. Bowel sounds are normal. He exhibits no distension and no mass. There is no abdominal tenderness. There is no rebound and no guarding.  Musculoskeletal:        General: No edema.  Cervical back: Neck supple.  Lymphadenopathy:    He has no cervical adenopathy.  Neurological: He is alert and oriented to person, place, and time.  Skin: Skin is warm and dry. He is not diaphoretic.  Psychiatric: He has a normal mood and affect. His behavior is normal.    BP 122/68   Pulse 74   Temp (!) 96.9 F (36.1 C) (Tympanic)   Ht '5\' 2"'$  (1.575 m)   Wt 141 lb 9.6 oz (64.2 kg)   SpO2 97%   BMI 25.90 kg/m  Wt Readings from Last 3 Encounters:  10/28/19 141 lb 9.6 oz (64.2 kg)  10/26/19 144 lb 6.4 oz (65.5 kg)  04/29/19 153 lb (69.4 kg)     Health Maintenance Due  Topic Date Due  . HIV Screening  02/13/1970    There are no preventive care reminders to display for this patient.  Lab Results  Component Value Date   TSH 1.66 09/23/2017   Lab Results  Component Value Date   WBC 5.2 04/29/2019   HGB 14.3 04/29/2019   HCT 43.6 04/29/2019   MCV 96.5 04/29/2019   PLT 170.0 04/29/2019   Lab Results  Component Value Date   NA 139 04/29/2019   K 3.8 04/29/2019   CO2 28 04/29/2019   GLUCOSE 134 (H) 04/29/2019   BUN 13 04/29/2019   CREATININE 1.10 04/29/2019   BILITOT 1.2 04/29/2019   ALKPHOS 54 04/29/2019   AST 25 04/29/2019   ALT 23 04/29/2019   PROT 6.8 04/29/2019    ALBUMIN 4.4 04/29/2019   CALCIUM 9.2 04/29/2019   GFR 67.35 04/29/2019   Lab Results  Component Value Date   CHOL 101 04/29/2019   Lab Results  Component Value Date   HDL 51.10 04/29/2019   Lab Results  Component Value Date   LDLCALC 38 04/29/2019   Lab Results  Component Value Date   TRIG 59.0 04/29/2019   Lab Results  Component Value Date   CHOLHDL 2 04/29/2019   Lab Results  Component Value Date   HGBA1C 7.2 (A) 10/26/2019      Assessment & Plan:   Problem List Items Addressed This Visit      Cardiovascular and Mediastinum   Essential hypertension - Primary   Relevant Medications   atorvastatin (LIPITOR) 40 MG tablet   lisinopril (ZESTRIL) 5 MG tablet   Other Relevant Orders   CBC   Comprehensive metabolic panel   Urinalysis, Routine w reflex microscopic     Digestive   Slow transit constipation   Relevant Medications   docusate sodium (COLACE) 100 MG capsule     Other   HYPERCHOLESTEROLEMIA   Relevant Medications   atorvastatin (LIPITOR) 40 MG tablet   lisinopril (ZESTRIL) 5 MG tablet   Other Relevant Orders   Comprehensive metabolic panel   LDL cholesterol, direct   Lipid panel   Healthcare maintenance   Relevant Orders   HIV Antibody (routine testing w rflx)   Epigastric pain   Relevant Orders   CBC   Comprehensive metabolic panel   Amylase   Lipase   US Abdomen Complete      Meds ordered this encounter  Medications  . docusate sodium (COLACE) 100 MG capsule    Sig: Take 1 capsule (100 mg total) by mouth 2 (two) times daily.    Dispense:  10 capsule    Refill:  0  . atorvastatin (LIPITOR) 40 MG tablet    Sig: Take 1 tablet (40 mg total)  by mouth daily.    Dispense:  90 tablet    Refill:  3  . lisinopril (ZESTRIL) 5 MG tablet    Sig: TAKE 1 TABLET BY MOUTH ONCE DAILY (FUTURE REFILLS WILL NEED TO COME FROM PCP)    Dispense:  90 tablet    Refill:  2    Follow-up: Return in about 1 month (around 11/25/2019).    Libby Maw, MD

## 2019-10-29 LAB — HIV ANTIBODY (ROUTINE TESTING W REFLEX): HIV 1&2 Ab, 4th Generation: NONREACTIVE

## 2019-11-03 MED FILL — REPAGLINIDE 2 MG TABS: 2 | 90 days supply | Qty: 540 | Fill #2

## 2019-11-12 ENCOUNTER — Ambulatory Visit
Admission: RE | Admit: 2019-11-12 | Discharge: 2019-11-12 | Disposition: A | Discharge status: Home / Self Care | Payer: No Typology Code available for payment source | Source: Ambulatory Visit | Attending: Family Medicine | Admitting: Family Medicine

## 2019-11-12 ENCOUNTER — Other Ambulatory Visit: Payer: Self-pay

## 2019-11-12 DIAGNOSIS — R1013 Epigastric pain: Secondary | ICD-10-CM

## 2019-11-24 ENCOUNTER — Other Ambulatory Visit: Payer: Self-pay

## 2019-11-25 ENCOUNTER — Encounter: Payer: Self-pay | Admitting: Family Medicine

## 2019-11-25 ENCOUNTER — Ambulatory Visit (INDEPENDENT_AMBULATORY_CARE_PROVIDER_SITE_OTHER): Payer: No Typology Code available for payment source | Admitting: Family Medicine

## 2019-11-25 VITALS — BP 136/68 | HR 72 | Temp 96.1°F | Ht 62.0 in | Wt 139.4 lb

## 2019-11-25 DIAGNOSIS — E538 Deficiency of other specified B group vitamins: Secondary | ICD-10-CM | POA: Diagnosis not present

## 2019-11-25 DIAGNOSIS — R5383 Other fatigue: Secondary | ICD-10-CM | POA: Diagnosis not present

## 2019-11-25 DIAGNOSIS — R634 Abnormal weight loss: Secondary | ICD-10-CM | POA: Diagnosis not present

## 2019-11-25 DIAGNOSIS — R1013 Epigastric pain: Secondary | ICD-10-CM | POA: Diagnosis not present

## 2019-11-25 LAB — TSH: TSH: 1.47 u[IU]/mL (ref 0.35–4.50)

## 2019-11-25 LAB — VITAMIN B12: Vitamin B-12: 198 pg/mL — ABNORMAL LOW (ref 211–911)

## 2019-11-25 NOTE — Patient Instructions (Addendum)

## 2019-11-25 NOTE — Progress Notes (Addendum)
Established Patient Office Visit  Subjective:  Patient ID: Elijah Cervantes, male    DOB: 1956/06/08  Age: 64 y.o. MRN: [id number]  CC:  Chief Complaint  Patient presents with  . Follow-up    1 month follow up, no concerns.     HPI WAH SABIC presents for follow-up of his midepigastric pain and constipation.  Fortunately the midepigastric pain has mostly resolved.  Colace has helped his constipation a great deal.  He is concerned about weight loss.  Seem to coincide with his shift to working nights.  He is slowly adjusting.  He is sleeping up to 7 hours daily.  Patient denies night sweats fevers or chills.  Denies cough or smoking history.  He does not drink alcohol.  He does experience headaches sometimes when he gets tired but these are his usual headaches that have not changed.  He denies depression.  Admits that he has been depressed in the distant past.  Past Medical History:  Diagnosis Date  . ABNORMAL ELECTROCARDIOGRAM 07/26/2009  . DEPRESSION 03/31/2007  . DIABETES MELLITUS, TYPE II 03/31/2007  . Dyspepsia   . 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 FATTY LIVER DISEASE 07/18/2008  . GERD 07/26/2009  . HSV-1 infection   . HYPERCHOLESTEROLEMIA 10/31/2010  . HYPERTENSION 03/31/2007  . NASH (nonalcoholic steatohepatitis)   . Personal history of colonic adenoma 02/10/2013    Past Surgical History:  Procedure Laterality Date  . COLONOSCOPY     2014 1 adenoma 2019 none  . hernia repair    . Stress Cardiolite  11/22/2003    Family History  Problem Relation Age of Onset  . Diabetes Mother   . Diabetes Brother   . Cancer Neg Hx   . Colon cancer Neg Hx   . Esophageal cancer Neg Hx   . Stomach cancer Neg Hx   . Rectal cancer Neg Hx   . Colon polyps Neg Hx     Social History   Socioeconomic History  . Marital status: Married    Spouse name: Not on file  . Number of children: Not on file  . Years of education: Not on file  . Highest education level: Not on file   Occupational History  . Occupation: Unemployed  Tobacco Use  . Smoking status: Never Smoker  . Smokeless tobacco: Never Used  Substance and Sexual Activity  . Alcohol use: Yes    Comment: rarely  . Drug use: No  . Sexual activity: Not on file  Other Topics Concern  . Not on file  Social History Narrative  . Not on file   Social Determinants of Health   Financial Resource Strain:   . Difficulty of Paying Living Expenses:   Food Insecurity:   . Worried About Charity fundraiser in the Last Year:   . Arboriculturist in the Last Year:   Transportation Needs:   . Film/video editor (Medical):   Marland Kitchen Lack of Transportation (Non-Medical):   Physical Activity:   . Days of Exercise per Week:   . Minutes of Exercise per Session:   Stress:   . Feeling of Stress :   Social Connections:   . Frequency of Communication with Friends and Family:   . Frequency of Social Gatherings with Friends and Family:   . Attends Religious Services:   . Active Member of Clubs or Organizations:   . Attends Archivist Meetings:   Marland Kitchen Marital Status:   Intimate Partner Violence:   .  Fear of Current or Ex-Partner:   . Emotionally Abused:   Marland Kitchen Physically Abused:   . Sexually Abused:     Outpatient Medications Prior to Visit  Medication Sig Dispense Refill  . aspirin 81 MG tablet Take 1 tablet (81 mg total) by mouth daily. 30 tablet 0  . atorvastatin (LIPITOR) 40 MG tablet Take 1 tablet (40 mg total) by mouth daily. 90 tablet 3  . bromocriptine (PARLODEL) 2.5 MG tablet TAKE 1 TABLET BY MOUTH DAILY. 90 tablet 3  . docusate sodium (COLACE) 100 MG capsule Take 1 capsule (100 mg total) by mouth 2 (two) times daily. 10 capsule 0  . glucose blood (FREESTYLE TEST STRIPS) test strip Use to test blood sugar once daily 100 each 3  . glucose monitoring kit (FREESTYLE) monitoring kit 1 each by Does not apply route as needed for other. 1 each 0  . INVOKANA 100 MG TABS tablet TAKE 1 TABLET BY MOUTH DAILY.  90 tablet 3  . Lancets (FREESTYLE) lancets Use to test blood sugar once daily 100 each 3  . lisinopril (ZESTRIL) 5 MG tablet TAKE 1 TABLET BY MOUTH ONCE DAILY (FUTURE REFILLS WILL NEED TO COME FROM PCP) 90 tablet 2  . pantoprazole (PROTONIX) 40 MG tablet TAKE 1 TABLET BY MOUTH ONCE A DAY 90 tablet 0  . pioglitazone-metformin (ACTOPLUS MET) 15-500 MG tablet TAKE 2 TABLETS BY MOUTH EVERY MORNING AND 1 TABLET BY MOUTH EVERY EVENING 270 tablet 4  . repaglinide (PRANDIN) 2 MG tablet TAKE 2 TABLETS BY MOUTH 3 TIMES DAILY BEFORE MEALS. 540 tablet 3  . Semaglutide (RYBELSUS) 3 MG TABS Take 3 mg by mouth daily. 90 tablet 3  . methocarbamol (ROBAXIN) 500 MG tablet TAKE 1 TABLET BY MOUTH AT BEDTIME AS NEEDED FOR MUSCLE SPASMS AND FOR LEG CRAMPS (Patient not taking: Reported on 10/28/2019) 90 tablet 1  . triamcinolone (KENALOG) 0.025 % ointment Apply to affected area three times a day as needed for rash (Patient not taking: Reported on 11/25/2019) 80 g 3   No facility-administered medications prior to visit.    No Known Allergies  ROS Review of Systems  Constitutional: Positive for unexpected weight change. Negative for chills, diaphoresis, fatigue and fever.  HENT: Negative.   Eyes: Negative for photophobia and visual disturbance.  Respiratory: Negative.  Negative for cough and shortness of breath.   Cardiovascular: Negative.   Gastrointestinal: Negative.  Negative for anal bleeding and blood in stool.  Endocrine: Negative for polyphagia and polyuria.  Genitourinary: Negative for difficulty urinating, dysuria, frequency and urgency.  Musculoskeletal: Negative for gait problem.  Skin: Negative for color change and pallor.  Allergic/Immunologic: Negative for immunocompromised state.  Neurological: Negative for speech difficulty.  Hematological: Does not bruise/bleed easily.  Psychiatric/Behavioral: Negative for dysphoric mood. The patient is not nervous/anxious.       Objective:    Physical Exam   Constitutional: He is oriented to person, place, and time. He appears well-developed and well-nourished. No distress.  HENT:  Head: Normocephalic and atraumatic.  Right Ear: External ear normal.  Left Ear: External ear normal.  Eyes: Conjunctivae are normal. Right eye exhibits no discharge. Left eye exhibits no discharge. No scleral icterus.  Cardiovascular: Normal rate and regular rhythm.  Pulmonary/Chest: Effort normal and breath sounds normal.  Abdominal: Bowel sounds are normal. He exhibits no distension. There is no abdominal tenderness. There is no rebound and no guarding.  Musculoskeletal:        General: No edema.  Neurological: He is  alert and oriented to person, place, and time.  Skin: Skin is warm and dry. He is not diaphoretic.  Psychiatric: He has a normal mood and affect. His behavior is normal.    BP 136/68   Pulse 72   Temp (!) 96.1 F (35.6 C) (Tympanic)   Ht '5\' 2"'$  (1.575 m)   Wt 139 lb 6.4 oz (63.2 kg)   SpO2 98%   BMI 25.50 kg/m  Wt Readings from Last 3 Encounters:  11/25/19 139 lb 6.4 oz (63.2 kg)  10/28/19 141 lb 9.6 oz (64.2 kg)  10/26/19 144 lb 6.4 oz (65.5 kg)     There are no preventive care reminders to display for this patient.  There are no preventive care reminders to display for this patient.  Lab Results  Component Value Date   TSH 1.47 11/25/2019   Lab Results  Component Value Date   WBC 5.1 10/28/2019   HGB 14.6 10/28/2019   HCT 43.6 10/28/2019   MCV 97.2 10/28/2019   PLT 197.0 10/28/2019   Lab Results  Component Value Date   NA 140 10/28/2019   K 4.3 10/28/2019   CO2 31 10/28/2019   GLUCOSE 78 10/28/2019   BUN 19 10/28/2019   CREATININE 1.06 10/28/2019   BILITOT 0.8 10/28/2019   ALKPHOS 61 10/28/2019   AST 24 10/28/2019   ALT 24 10/28/2019   PROT 7.3 10/28/2019   ALBUMIN 4.6 10/28/2019   CALCIUM 10.3 10/28/2019   GFR 70.18 10/28/2019   Lab Results  Component Value Date   CHOL 124 10/28/2019   Lab Results   Component Value Date   HDL 59.00 10/28/2019   Lab Results  Component Value Date   LDLCALC 55 10/28/2019   Lab Results  Component Value Date   TRIG 51.0 10/28/2019   Lab Results  Component Value Date   CHOLHDL 2 10/28/2019   Lab Results  Component Value Date   HGBA1C 7.2 (A) 10/26/2019      Assessment & Plan:   Problem List Items Addressed This Visit      Other   Epigastric pain   Weight loss   Fatigue - Primary   Relevant Orders   TSH (Completed)   Vitamin B12 (Completed)   B12 deficiency   Relevant Medications   b complex vitamins capsule      Follow-up: Return in about 3 months (around 02/25/2020).   Patient will start a multivitamin and try Glucerna.  Believe that his weight loss may be and adjustment reaction.  Follow up in 3 months. Libby Maw, MD

## 2019-11-28 DIAGNOSIS — E538 Deficiency of other specified B group vitamins: Secondary | ICD-10-CM | POA: Insufficient documentation

## 2019-11-28 MED ORDER — B COMPLEX VITAMINS PO CAPS: 1.0000 | ORAL_CAPSULE | Freq: Every day | ORAL | 2 refills | Status: DC

## 2019-11-28 NOTE — Addendum Note (Signed)
Addended by: Abelino Derrick A on: 11/28/2019 07:00 PM   Modules accepted: Orders

## 2019-12-23 ENCOUNTER — Telehealth: Payer: Self-pay

## 2019-12-23 MED FILL — ATORVASTATIN 40 MG TABLET: 40 | 90 days supply | Qty: 90 | Fill #2

## 2019-12-23 MED FILL — BROMOCRIPTINE 2.5 MG TABLET: 2.5 | 90 days supply | Qty: 90 | Fill #2

## 2019-12-23 NOTE — Telephone Encounter (Signed)
Elijah Cervantes (Key: [id number])  [employer] is reviewing your PA request. You may close this dialog, return to your dashboard, and perform other tasks.  To check for an update later, open this request again from your dashboard. If [employer] has not replied within 24 hours for urgent requests or within 48 hours for standard requests, please contact [employer] at [phone].

## 2019-12-23 NOTE — Telephone Encounter (Signed)
PRIOR AUTHORIZATION  PA initiation date: 12/23/19  Medication: [employer]: [place] completed electronically through [employer] My Meds: Yes  Will await insurance response re: approval/denial.  Elijah Cervantes (Key: [id number])  Your information has been sent to [place].  Elijah Cervantes (Key: [id number]) Invokana 100MG  tablets   Form [employer] ePA Form 2017 NCPDP Created 16 minutes ago Sent to Plan 12 minutes ago Plan Response 12 minutes ago Submit Clinical Questions 1 minute ago Determination Wait for Determination Please wait for [employer] 2017 to return a determination.

## 2019-12-23 NOTE — Telephone Encounter (Signed)
PRIOR AUTHORIZATION  PA initiation date: 12/23/19  Medication: [place]: [place] completed electronically through [place] My Meds: Yes  Will await insurance response re: approval/denial.  Rito Ehrlich (Key: [id number])  Your information has been sent to [place]. Romualdo Kranzburg (Key: [id number])  [employer] is reviewing your PA request. You may close this dialog, return to your dashboard, and perform other tasks.  To check for an update later, open this request again from your dashboard. If [employer] has not replied within 24 hours for urgent requests or within 48 hours for standard requests, please contact [employer] at [phone].

## 2019-12-24 ENCOUNTER — Encounter: Payer: Self-pay | Admitting: Endocrinology

## 2019-12-24 ENCOUNTER — Ambulatory Visit (INDEPENDENT_AMBULATORY_CARE_PROVIDER_SITE_OTHER): Payer: No Typology Code available for payment source | Admitting: Endocrinology

## 2019-12-24 ENCOUNTER — Other Ambulatory Visit: Payer: Self-pay

## 2019-12-24 VITALS — BP 132/62 | HR 70 | Ht 62.0 in | Wt 139.6 lb

## 2019-12-24 DIAGNOSIS — E119 Type 2 diabetes mellitus without complications: Secondary | ICD-10-CM

## 2019-12-24 LAB — POCT GLYCOSYLATED HEMOGLOBIN (HGB A1C): Hemoglobin A1C: 7.2 % — AB (ref 4.0–5.6)

## 2019-12-24 MED ORDER — RYBELSUS 7 MG PO TABS: 7.0000 mg | ORAL_TABLET | Freq: Every day | ORAL | 3 refills | Status: DC

## 2019-12-24 MED FILL — RYBELSUS 7 MG TABS: 7 | 90 days supply | Qty: 90 | Fill #0

## 2019-12-24 NOTE — Progress Notes (Signed)
Subjective:    Patient ID: Elijah Cervantes, male    DOB: October 01, 1955, 64 y.o.   MRN: [id number]  HPI Pt returns for f/u of diabetes mellitus:  DM type: 2 Dx'ed: 8299 Complications: none Therapy: 6 oral meds DKA: never Severe hypoglycemia: never.   Pancreatitis: never.   [place]: he works 3rd shift Other: he has declined acarbose and welchol; he declines insulin; however, wife is Therapist, sports, so he could take in emergency if necessary.   Interval history: pt states cbg varies from 70-170.  pt states he feels well in general.   Past Medical History:  Diagnosis Date  . ABNORMAL ELECTROCARDIOGRAM 07/26/2009  . DEPRESSION 03/31/2007  . DIABETES MELLITUS, TYPE II 03/31/2007  . Dyspepsia   . 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 FATTY LIVER DISEASE 07/18/2008  . GERD 07/26/2009  . HSV-1 infection   . HYPERCHOLESTEROLEMIA 10/31/2010  . HYPERTENSION 03/31/2007  . NASH (nonalcoholic steatohepatitis)   . Personal history of colonic adenoma 02/10/2013    Past Surgical History:  Procedure Laterality Date  . COLONOSCOPY     2014 1 adenoma 2019 none  . hernia repair    . Stress Cardiolite  11/22/2003    Social History   Socioeconomic History  . Marital status: Married    Spouse name: Not on file  . Number of children: Not on file  . Years of education: Not on file  . Highest education level: Not on file  Occupational History  . Occupation: Unemployed  Tobacco Use  . Smoking status: Never Smoker  . Smokeless tobacco: Never Used  Substance and Sexual Activity  . Alcohol use: Yes    Comment: rarely  . Drug use: No  . Sexual activity: Not on file  Other Topics Concern  . Not on file  Social History Narrative  . Not on file   Social Determinants of Health   Financial Resource Strain:   . Difficulty of Paying Living Expenses:   Food Insecurity:   . Worried About Charity fundraiser in the Last Year:   . Arboriculturist in the Last Year:   Transportation Needs:   . Lexicographer (Medical):   Marland Kitchen Lack of Transportation (Non-Medical):   Physical Activity:   . Days of Exercise per Week:   . Minutes of Exercise per Session:   Stress:   . Feeling of Stress :   Social Connections:   . Frequency of Communication with Friends and Family:   . Frequency of Social Gatherings with Friends and Family:   . Attends Religious Services:   . Active Member of Clubs or Organizations:   . Attends Archivist Meetings:   Marland Kitchen Marital Status:   Intimate Partner Violence:   . Fear of Current or Ex-Partner:   . Emotionally Abused:   Marland Kitchen Physically Abused:   . Sexually Abused:     Current Outpatient Medications on File Prior to Visit  Medication Sig Dispense Refill  . aspirin 81 MG tablet Take 1 tablet (81 mg total) by mouth daily. 30 tablet 0  . atorvastatin (LIPITOR) 40 MG tablet Take 1 tablet (40 mg total) by mouth daily. 90 tablet 3  . b complex vitamins capsule Take 1 capsule by mouth daily. 90 capsule 2  . bromocriptine (PARLODEL) 2.5 MG tablet TAKE 1 TABLET BY MOUTH DAILY. 90 tablet 3  . docusate sodium (COLACE) 100 MG capsule Take 1 capsule (100 mg total) by mouth 2 (two) times daily. 10  capsule 0  . glucose blood (FREESTYLE TEST STRIPS) test strip Use to test blood sugar once daily 100 each 3  . glucose monitoring kit (FREESTYLE) monitoring kit 1 each by Does not apply route as needed for other. 1 each 0  . INVOKANA 100 MG TABS tablet TAKE 1 TABLET BY MOUTH DAILY. 90 tablet 3  . Lancets (FREESTYLE) lancets Use to test blood sugar once daily 100 each 3  . lisinopril (ZESTRIL) 5 MG tablet TAKE 1 TABLET BY MOUTH ONCE DAILY (FUTURE REFILLS WILL NEED TO COME FROM PCP) 90 tablet 2  . methocarbamol (ROBAXIN) 500 MG tablet TAKE 1 TABLET BY MOUTH AT BEDTIME AS NEEDED FOR MUSCLE SPASMS AND FOR LEG CRAMPS 90 tablet 1  . pantoprazole (PROTONIX) 40 MG tablet TAKE 1 TABLET BY MOUTH ONCE A DAY 90 tablet 0  . pioglitazone-metformin (ACTOPLUS MET) 15-500 MG tablet TAKE 2  TABLETS BY MOUTH EVERY MORNING AND 1 TABLET BY MOUTH EVERY EVENING 270 tablet 4  . repaglinide (PRANDIN) 2 MG tablet TAKE 2 TABLETS BY MOUTH 3 TIMES DAILY BEFORE MEALS. 540 tablet 3  . triamcinolone (KENALOG) 0.025 % ointment Apply to affected area three times a day as needed for rash 80 g 3   No current facility-administered medications on file prior to visit.    No Known Allergies  Family History  Problem Relation Age of Onset  . Diabetes Mother   . Diabetes Brother   . Cancer Neg Hx   . Colon cancer Neg Hx   . Esophageal cancer Neg Hx   . Stomach cancer Neg Hx   . Rectal cancer Neg Hx   . Colon polyps Neg Hx     BP 132/62 (BP Location: Left Arm, Patient Position: Sitting, Cuff Size: Normal)   Pulse 70   Ht '5\' 2"'$  (1.575 m)   Wt 139 lb 9.6 oz (63.3 kg)   SpO2 98%   BMI 25.53 kg/m    Review of Systems He denies hypoglycemia.      Objective:   Physical Exam VITAL SIGNS:  See vs page GENERAL: no distress Pulses: dorsalis pedis intact bilat.   MSK: no deformity of the feet CV: trace bilat leg edema, and bilat vv's Skin:  no ulcer on the feet.  normal color and temp on the feet. Neuro: sensation is intact to touch on the feet  Lab Results  Component Value Date   HGBA1C 7.2 (A) 12/24/2019       Assessment & Plan:  Type 2 DM: he would benefit from increased rx, if it can be done with a regimen that avoids or minimizes hypoglycemia. Edema: This limits rx options Hypoglycemia, mild: however, this limits aggressiveness of glycemic control   Patient Instructions  I have sent a prescription to your pharmacy, to increase the Rybelsus.  To use up the 3 mg pills, take 2 pills per day Please continue the same other medications.   check your blood sugar once a day.  vary the time of day when you check, between before the 3 meals, and at bedtime.  also check if you have symptoms of your blood sugar being too high or too low.  please keep a record of the readings and bring it  to your next appointment here (or you can bring the meter itself).  You can write it on any piece of paper.  please call us sooner if your blood sugar goes below 70, or if you have a lot of readings over 200. Please  come back for a follow-up appointment in 3 months.

## 2019-12-24 NOTE — Patient Instructions (Addendum)
I have sent a prescription to your pharmacy, to increase the Rybelsus.  To use up the 3 mg pills, take 2 pills per day Please continue the same other medications.   check your blood sugar once a day.  vary the time of day when you check, between before the 3 meals, and at bedtime.  also check if you have symptoms of your blood sugar being too high or too low.  please keep a record of the readings and bring it to your next appointment here (or you can bring the meter itself).  You can write it on any piece of paper.  please call us sooner if your blood sugar goes below 70, or if you have a lot of readings over 200. Please come back for a follow-up appointment in 3 months.

## 2019-12-24 NOTE — Telephone Encounter (Signed)
Additional documentation was received for PA for Actos Plus Metformin. Paperwork was given to MD to fill out and complete as needed.

## 2019-12-28 ENCOUNTER — Encounter: Payer: Self-pay | Admitting: Endocrinology

## 2019-12-28 NOTE — Telephone Encounter (Signed)
Please review and advise.    Thank you.

## 2019-12-29 ENCOUNTER — Other Ambulatory Visit: Payer: Self-pay | Admitting: Endocrinology

## 2019-12-29 MED ORDER — METFORMIN HCL ER 500 MG PO TB24: 1500.0000 mg | ORAL_TABLET | Freq: Every day | ORAL | 3 refills | Status: DC

## 2019-12-29 MED ORDER — PIOGLITAZONE HCL 45 MG PO TABS: 45.0000 mg | ORAL_TABLET | Freq: Every day | ORAL | 3 refills | Status: DC

## 2019-12-29 MED ORDER — JARDIANCE 10 MG PO TABS: 10.0000 mg | ORAL_TABLET | Freq: Every day | ORAL | 3 refills | Status: DC

## 2019-12-29 MED FILL — metFORMIN HCL ER 500 MG TB2: 500 | 90 days supply | Qty: 270 | Fill #0

## 2019-12-29 MED FILL — PIOGLITAZONE HCL 45 MG TAB: 45 | 90 days supply | Qty: 90 | Fill #0

## 2019-12-29 MED FILL — JARDIANCE 10 MG TABLET: 10 | 90 days supply | Qty: 90 | Fill #0

## 2019-12-29 NOTE — Telephone Encounter (Signed)
DENIAL  Medication: [employer]: [place] PA response: DENIED Rationale: As indicated below  Document has been given to Dr. Loanne Drilling for him to review and address. Will await his response.  Malak Garling (Key: [id number])  This request has received an Unfavorable outcome.  An eAppeal may be available. View the bottom of the request to see if an eAppeal is available along with any additional information provided by [employer].  Larnce Prickett (Key: [id number]) Invokana 100MG  tablets   Form [employer] ePA Form 2017 NCPDP Created 6 days ago Sent to Plan 6 days ago Plan Response 6 days ago Submit Clinical Questions 6 days ago Determination Unfavorable 5 days ago Message from Plan This request has not been approved. Based on the information submitted for review, you did not meet our step therapy rule for the requested drug. Our guideline named STANDARD STEP THERAPY requires that you have tried preferred options before receiving coverage for this drug. In order for your request to be approved, your provider needs to tell us that you have tried the step therapies listed below. Your provider may give a reason why you cannot take our suggested step therapies, including a statement that these therapies would not work as well or could cause side effects. In some cases, the requested medication or alternatives offered may have additional approval requirements. Approval requires you to try: Elsie Stain, Synjardy XR, or Xigduo XR. This request was denied because we did not receive information that you have tried any of the preferred medications listed above. Please work with your doctor to use a different medication or to provide more information if it will allow [place] to approve the request. A written notification letter will follow with additional details.

## 2019-12-29 NOTE — Telephone Encounter (Signed)
Ok, I have sent a prescription to your pharmacy, to change to [place]

## 2019-12-29 NOTE — Telephone Encounter (Signed)
DENIAL  Medication: [place]: [place] PA response: DENIED Rationale: Trial of step therapy, Metformin and Pioglitazone SEPARATED rather than combined.  Document has been given to Dr. Loanne Drilling for him to review and address. Upon his review, Dr. Loanne Drilling sent Rx's as follows:  Outpatient Medication Detail   Disp Refills Start End   metFORMIN (GLUCOPHAGE-XR) 500 MG 24 hr tablet 270 tablet 3 12/29/2019    Sig - Route: Take 3 tablets (1,500 mg total) by mouth daily. - Oral   Sent to pharmacy as: metFORMIN (GLUCOPHAGE-XR) 500 MG 24 hr tablet   E-Prescribing Status: Receipt confirmed by pharmacy (12/29/2019 1:06 PM EDT)    Outpatient Medication Detail   Disp Refills Start End   pioglitazone (ACTOS) 45 MG tablet 90 tablet 3 12/29/2019    Sig - Route: Take 1 tablet (45 mg total) by mouth daily. - Oral   Sent to pharmacy as: pioglitazone (ACTOS) 45 MG tablet   E-Prescribing Status: Receipt confirmed by pharmacy (12/29/2019 1:05 PM EDT)

## 2019-12-29 NOTE — Telephone Encounter (Signed)
My Chart message sent informing pt about this change.

## 2019-12-29 NOTE — Addendum Note (Signed)
Addended by: Renato Shin on: 12/29/2019 04:04 PM   Modules accepted: Orders

## 2020-01-05 MED FILL — INVOKANA 100 MG TABLET: 100 | 90 days supply | Qty: 90 | Fill #2

## 2020-01-24 MED FILL — REPAGLINIDE 2 MG TABS: 2 | 90 days supply | Qty: 540 | Fill #3

## 2020-02-21 MED FILL — LISINOPRIL 5 MG TABLET: 5 | 90 days supply | Qty: 90 | Fill #1

## 2020-02-28 ENCOUNTER — Other Ambulatory Visit: Payer: Self-pay

## 2020-02-28 ENCOUNTER — Ambulatory Visit (INDEPENDENT_AMBULATORY_CARE_PROVIDER_SITE_OTHER): Payer: No Typology Code available for payment source | Admitting: Family Medicine

## 2020-02-28 ENCOUNTER — Encounter: Payer: Self-pay | Admitting: Family Medicine

## 2020-02-28 VITALS — BP 126/64 | HR 77 | Temp 96.1°F | Ht 62.0 in | Wt 138.4 lb

## 2020-02-28 DIAGNOSIS — E7800 Pure hypercholesterolemia, unspecified: Secondary | ICD-10-CM | POA: Diagnosis not present

## 2020-02-28 DIAGNOSIS — Z0000 Encounter for general adult medical examination without abnormal findings: Secondary | ICD-10-CM

## 2020-02-28 DIAGNOSIS — E538 Deficiency of other specified B group vitamins: Secondary | ICD-10-CM

## 2020-02-28 DIAGNOSIS — I10 Essential (primary) hypertension: Secondary | ICD-10-CM

## 2020-02-28 LAB — COMPREHENSIVE METABOLIC PANEL
ALT: 24 U/L (ref 0–53)
AST: 22 U/L (ref 0–37)
Albumin: 4.5 g/dL (ref 3.5–5.2)
Alkaline Phosphatase: 51 U/L (ref 39–117)
BUN: 27 mg/dL — ABNORMAL HIGH (ref 6–23)
CO2: 28 mEq/L (ref 19–32)
Calcium: 9.8 mg/dL (ref 8.4–10.5)
Chloride: 101 mEq/L (ref 96–112)
Creatinine, Ser: 1.07 mg/dL (ref 0.40–1.50)
GFR: 69.35 mL/min (ref >60.00)
Glucose, Bld: 99 mg/dL (ref 70–99)
Potassium: 3.8 mEq/L (ref 3.5–5.1)
Sodium: 138 mEq/L (ref 135–145)
Total Bilirubin: 0.5 mg/dL (ref 0.2–1.2)
Total Protein: 6.7 g/dL (ref 6.0–8.3)

## 2020-02-28 LAB — BASIC METABOLIC PANEL
BUN: 27 mg/dL — ABNORMAL HIGH (ref 6–23)
CO2: 28 mEq/L (ref 19–32)
Calcium: 9.8 mg/dL (ref 8.4–10.5)
Chloride: 101 mEq/L (ref 96–112)
Creatinine, Ser: 1.07 mg/dL (ref 0.40–1.50)
GFR: 69.35 mL/min (ref >60.00)
Glucose, Bld: 99 mg/dL (ref 70–99)
Potassium: 3.8 mEq/L (ref 3.5–5.1)
Sodium: 138 mEq/L (ref 135–145)

## 2020-02-28 LAB — CBC
HCT: 40.9 % (ref 39.0–52.0)
Hemoglobin: 13.6 g/dL (ref 13.0–17.0)
MCHC: 33.2 g/dL (ref 30.0–36.0)
MCV: 97.3 fl (ref 78.0–100.0)
Platelets: 205 10*3/uL (ref 150.0–400.0)
RBC: 4.21 Mil/uL — ABNORMAL LOW (ref 4.22–5.81)
RDW: 12.6 % (ref 11.5–15.5)
WBC: 5.6 10*3/uL (ref 4.0–10.5)

## 2020-02-28 LAB — URINALYSIS, ROUTINE W REFLEX MICROSCOPIC
Bilirubin Urine: NEGATIVE
Hgb urine dipstick: NEGATIVE
Ketones, ur: NEGATIVE
Leukocytes,Ua: NEGATIVE
Nitrite: NEGATIVE
RBC / HPF: NONE SEEN (ref 0–?)
Specific Gravity, Urine: 1.015 (ref 1.000–1.030)
Total Protein, Urine: NEGATIVE
Urine Glucose: >=1000 — AB
Urobilinogen, UA: 0.2 (ref 0.0–1.0)
WBC, UA: NONE SEEN (ref 0–?)
pH: 5.5 (ref 5.0–8.0)

## 2020-02-28 LAB — LDL CHOLESTEROL, DIRECT: Direct LDL: 51 mg/dL

## 2020-02-28 LAB — PSA: PSA: 1.44 ng/mL (ref 0.10–4.00)

## 2020-02-28 LAB — VITAMIN B12: Vitamin B-12: 440 pg/mL (ref 211–911)

## 2020-02-28 NOTE — Progress Notes (Addendum)
Established Patient Office Visit  Subjective:  Patient ID: Elijah Cervantes, male    DOB: 12-16-1955  Age: 64 y.o. MRN: [id number]  CC:  Chief Complaint  Patient presents with  . Follow-up    3 month follow up, no concerns or complaints    HPI [name] presents for follow-up of his elevated cholesterol, constipation, B12 deficiency.  Continues to see endocrinology for diet [place] care.  On low-dose Zestril mostly for renal protection.  Tolerating high-dose Lipitor.  Abdominal pain has resolved.  Stools remain a little bit hard on Colace.  Bumped himself up to 2 pills in the morning.  Continues B complex for B12 deficiency.  Past Medical History:  Diagnosis Date  . ABNORMAL ELECTROCARDIOGRAM 07/26/2009  . DEPRESSION 03/31/2007  . DIABETES MELLITUS, TYPE II 03/31/2007  . Dyspepsia   . 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 FATTY LIVER DISEASE 07/18/2008  . GERD 07/26/2009  . HSV-1 infection   . HYPERCHOLESTEROLEMIA 10/31/2010  . HYPERTENSION 03/31/2007  . NASH (nonalcoholic steatohepatitis)   . Personal history of colonic adenoma 02/10/2013    Past Surgical History:  Procedure Laterality Date  . COLONOSCOPY     2014 1 adenoma 2019 none  . hernia repair    . Stress Cardiolite  11/22/2003    Family History  Problem Relation Age of Onset  . Diabetes Mother   . Diabetes Brother   . Cancer Neg Hx   . Colon cancer Neg Hx   . Esophageal cancer Neg Hx   . Stomach cancer Neg Hx   . Rectal cancer Neg Hx   . Colon polyps Neg Hx     Social History   Socioeconomic History  . Marital status: Married    Spouse name: Not on file  . Number of children: Not on file  . Years of education: Not on file  . Highest education level: Not on file  Occupational History  . Occupation: Unemployed  Tobacco Use  . Smoking status: Never Smoker  . Smokeless tobacco: Never Used  Substance and Sexual Activity  . Alcohol use: Yes    Comment: rarely  . Drug use: No  . Sexual  activity: Not on file  Other Topics Concern  . Not on file  Social History Narrative  . Not on file   Social Determinants of Health   Financial Resource Strain:   . Difficulty of Paying Living Expenses:   Food Insecurity:   . Worried About Charity fundraiser in the Last Year:   . Arboriculturist in the Last Year:   Transportation Needs:   . Film/video editor (Medical):   Marland Kitchen Lack of Transportation (Non-Medical):   Physical Activity:   . Days of Exercise per Week:   . Minutes of Exercise per Session:   Stress:   . Feeling of Stress :   Social Connections:   . Frequency of Communication with Friends and Family:   . Frequency of Social Gatherings with Friends and Family:   . Attends Religious Services:   . Active Member of Clubs or Organizations:   . Attends Archivist Meetings:   Marland Kitchen Marital Status:   Intimate Partner Violence:   . Fear of Current or Ex-Partner:   . Emotionally Abused:   Marland Kitchen Physically Abused:   . Sexually Abused:     Outpatient Medications Prior to Visit  Medication Sig Dispense Refill  . aspirin 81 MG tablet Take 1 tablet (81 mg total) by  mouth daily. 30 tablet 0  . atorvastatin (LIPITOR) 40 MG tablet Take 1 tablet (40 mg total) by mouth daily. 90 tablet 3  . bromocriptine (PARLODEL) 2.5 MG tablet TAKE 1 TABLET BY MOUTH DAILY. 90 tablet 3  . docusate sodium (COLACE) 100 MG capsule Take 1 capsule (100 mg total) by mouth 2 (two) times daily. 10 capsule 0  . glucose blood (FREESTYLE TEST STRIPS) test strip Use to test blood sugar once daily 100 each 3  . glucose monitoring kit (FREESTYLE) monitoring kit 1 each by Does not apply route as needed for other. 1 each 0  . INVOKANA 100 MG TABS tablet Take 100 mg by mouth daily.    . Lancets (FREESTYLE) lancets Use to test blood sugar once daily 100 each 3  . lisinopril (ZESTRIL) 5 MG tablet TAKE 1 TABLET BY MOUTH ONCE DAILY (FUTURE REFILLS WILL NEED TO COME FROM PCP) 90 tablet 2  . metFORMIN  (GLUCOPHAGE-XR) 500 MG 24 hr tablet Take 3 tablets (1,500 mg total) by mouth daily. 270 tablet 3  . pantoprazole (PROTONIX) 40 MG tablet TAKE 1 TABLET BY MOUTH ONCE A DAY 90 tablet 0  . repaglinide (PRANDIN) 2 MG tablet TAKE 2 TABLETS BY MOUTH 3 TIMES DAILY BEFORE MEALS. 540 tablet 3  . Semaglutide (RYBELSUS) 7 MG TABS Take 7 mg by mouth daily. 90 tablet 3  . triamcinolone (KENALOG) 0.025 % ointment Apply to affected area three times a day as needed for rash 80 g 3  . b complex vitamins capsule Take 1 capsule by mouth daily. 90 capsule 2  . empagliflozin (JARDIANCE) 10 MG TABS tablet Take 10 mg by mouth daily before breakfast. (Patient not taking: Reported on 02/28/2020) 90 tablet 3  . methocarbamol (ROBAXIN) 500 MG tablet TAKE 1 TABLET BY MOUTH AT BEDTIME AS NEEDED FOR MUSCLE SPASMS AND FOR LEG CRAMPS (Patient not taking: Reported on 02/28/2020) 90 tablet 1  . pioglitazone (ACTOS) 45 MG tablet Take 1 tablet (45 mg total) by mouth daily. (Patient not taking: Reported on 02/28/2020) 90 tablet 3   No facility-administered medications prior to visit.    No Known Allergies  ROS Review of Systems  Constitutional: Negative.   HENT: Negative.   Eyes: Negative for photophobia and visual disturbance.  Respiratory: Negative.   Cardiovascular: Negative.   Gastrointestinal: Positive for constipation. Negative for abdominal pain, anal bleeding and blood in stool.  Endocrine: Negative for polyphagia and polyuria.  Genitourinary: Negative.   Musculoskeletal: Negative for gait problem and joint swelling.  Skin: Negative for pallor and rash.  Neurological: Negative for tremors and speech difficulty.  Hematological: Does not bruise/bleed easily.  Psychiatric/Behavioral: Negative.       Objective:    Physical Exam Vitals and nursing note reviewed.  Constitutional:      General: He is not in acute distress.    Appearance: Normal appearance. He is normal weight. He is not ill-appearing,  toxic-appearing or diaphoretic.  HENT:     Head: Normocephalic and atraumatic.     Right Ear: External ear normal.     Left Ear: External ear normal.  Eyes:     General:        Right eye: No discharge.        Left eye: No discharge.     Extraocular Movements: Extraocular movements intact.     Conjunctiva/sclera: Conjunctivae normal.     Pupils: Pupils are equal, round, and reactive to light.  Cardiovascular:     Rate and Rhythm:  Normal rate and regular rhythm.  Pulmonary:     Effort: Pulmonary effort is normal.     Breath sounds: Normal breath sounds.  Abdominal:     General: Bowel sounds are normal.     Palpations: Abdomen is soft.  Musculoskeletal:     Cervical back: No rigidity or tenderness.     Right lower leg: No edema.     Left lower leg: No edema.  Lymphadenopathy:     Cervical: No cervical adenopathy.  Neurological:     General: No focal deficit present.     Mental Status: He is alert and oriented to person, place, and time.  Psychiatric:        Mood and Affect: Mood normal.        Behavior: Behavior normal.     BP 126/64   Pulse 77   Temp (!) 96.1 F (35.6 C) (Tympanic)   Ht '5\' 2"'$  (1.575 m)   Wt 138 lb 6.4 oz (62.8 kg)   SpO2 96%   BMI 25.31 kg/m  Wt Readings from Last 3 Encounters:  02/28/20 138 lb 6.4 oz (62.8 kg)  12/24/19 139 lb 9.6 oz (63.3 kg)  11/25/19 139 lb 6.4 oz (63.2 kg)     Health Maintenance Due  Topic Date Due  . COVID-19 Vaccine (1) Never done  . PNA vac Low Risk Adult (1 of 2 - PCV13) 02/14/2020    There are no preventive care reminders to display for this patient.  Lab Results  Component Value Date   TSH 1.47 11/25/2019   Lab Results  Component Value Date   WBC 5.6 02/28/2020   HGB 13.6 02/28/2020   HCT 40.9 02/28/2020   MCV 97.3 02/28/2020   PLT 205.0 02/28/2020   Lab Results  Component Value Date   NA 138 02/28/2020   NA 138 02/28/2020   K 3.8 02/28/2020   K 3.8 02/28/2020   CO2 28 02/28/2020   CO2 28  02/28/2020   GLUCOSE 99 02/28/2020   GLUCOSE 99 02/28/2020   BUN 27 (H) 02/28/2020   BUN 27 (H) 02/28/2020   CREATININE 1.07 02/28/2020   CREATININE 1.07 02/28/2020   BILITOT 0.5 02/28/2020   ALKPHOS 51 02/28/2020   AST 22 02/28/2020   ALT 24 02/28/2020   PROT 6.7 02/28/2020   ALBUMIN 4.5 02/28/2020   CALCIUM 9.8 02/28/2020   CALCIUM 9.8 02/28/2020   GFR 69.35 02/28/2020   GFR 69.35 02/28/2020   Lab Results  Component Value Date   CHOL 124 10/28/2019   Lab Results  Component Value Date   HDL 59.00 10/28/2019   Lab Results  Component Value Date   LDLCALC 55 10/28/2019   Lab Results  Component Value Date   TRIG 51.0 10/28/2019   Lab Results  Component Value Date   CHOLHDL 2 10/28/2019   Lab Results  Component Value Date   HGBA1C 7.2 (A) 12/24/2019      Assessment & Plan:   Problem List Items Addressed This Visit      Cardiovascular and Mediastinum   Essential hypertension   Relevant Orders   CBC (Completed)   Basic metabolic panel (Completed)   Urinalysis, Routine w reflex microscopic (Completed)     Other   HYPERCHOLESTEROLEMIA   Relevant Orders   Comprehensive metabolic panel (Completed)   LDL cholesterol, direct (Completed)   Healthcare maintenance   Relevant Orders   PSA (Completed)   B12 deficiency - Primary   Relevant Medications   vitamin B-12 (  CYANOCOBALAMIN) 500 MCG tablet   Other Relevant Orders   Vitamin B12 (Completed)      Meds ordered this encounter  Medications  . vitamin B-12 (CYANOCOBALAMIN) 500 MCG tablet    Sig: Take 1 tablet (500 mcg total) by mouth daily.    Dispense:  90 tablet    Refill:  2    Follow-up: Return in about 6 months (around 08/29/2020).  Advised him to take the Colace 1 twice daily 1 in the morning 1 in the evening.  May be able to switch him to B12 only.  Encouraged him to drink lots of water.  Libby Maw, MD

## 2020-02-29 MED ORDER — CYANOCOBALAMIN 500 MCG PO TABS: 500.0000 ug | ORAL_TABLET | Freq: Every day | ORAL | 2 refills | Status: DC

## 2020-02-29 NOTE — Addendum Note (Signed)
Addended by: Jon Billings on: 02/29/2020 01:31 PM   Modules accepted: Orders

## 2020-03-24 ENCOUNTER — Ambulatory Visit (INDEPENDENT_AMBULATORY_CARE_PROVIDER_SITE_OTHER): Payer: No Typology Code available for payment source | Admitting: Endocrinology

## 2020-03-24 ENCOUNTER — Other Ambulatory Visit: Payer: Self-pay | Admitting: Endocrinology

## 2020-03-24 ENCOUNTER — Encounter: Payer: Self-pay | Admitting: Endocrinology

## 2020-03-24 ENCOUNTER — Other Ambulatory Visit: Payer: Self-pay

## 2020-03-24 VITALS — BP 130/70 | HR 72 | Ht 62.0 in | Wt 143.0 lb

## 2020-03-24 DIAGNOSIS — Z23 Encounter for immunization: Secondary | ICD-10-CM

## 2020-03-24 DIAGNOSIS — E119 Type 2 diabetes mellitus without complications: Secondary | ICD-10-CM | POA: Diagnosis not present

## 2020-03-24 LAB — POCT GLYCOSYLATED HEMOGLOBIN (HGB A1C): Hemoglobin A1C: 6.9 % — AB (ref 4.0–5.6)

## 2020-03-24 MED ORDER — METFORMIN HCL ER 500 MG PO TB24: 1500.0000 mg | ORAL_TABLET | Freq: Every day | ORAL | 3 refills | Status: DC

## 2020-03-24 MED ORDER — EMPAGLIFLOZIN 10 MG PO TABS: 10.0000 mg | ORAL_TABLET | Freq: Every day | ORAL | 3 refills | Status: DC

## 2020-03-24 MED ORDER — BROMOCRIPTINE MESYLATE 2.5 MG PO TABS: 2.5000 mg | ORAL_TABLET | Freq: Every day | ORAL | 3 refills | Status: DC

## 2020-03-24 MED ORDER — RYBELSUS 7 MG PO TABS: 7.0000 mg | ORAL_TABLET | Freq: Every day | ORAL | 3 refills | Status: DC

## 2020-03-24 MED FILL — metFORMIN HCL ER 500 MG TB2: 500 | 90 days supply | Qty: 270 | Fill #0

## 2020-03-24 MED FILL — BROMOCRIPTINE 2.5 MG TABLET: 2.5 | 90 days supply | Qty: 90 | Fill #0

## 2020-03-24 MED FILL — JARDIANCE 10 MG TABLET: 10 | 90 days supply | Qty: 90 | Fill #0

## 2020-03-24 MED FILL — RYBELSUS 7 MG TABS: 7 | 90 days supply | Qty: 90 | Fill #0

## 2020-03-24 NOTE — Progress Notes (Signed)
Subjective:    Patient ID: Elijah Cervantes, male    DOB: 06-Dec-1955, 64 y.o.   MRN: [id number]  HPI Pt returns for f/u of diabetes mellitus:  DM type: 2 Dx'ed: 9030 Complications: none Therapy: 6 oral meds DKA: never Severe hypoglycemia: never.   Pancreatitis: never.   [place]: he works 3rd shift Other: he has declined acarbose and welchol; he declines insulin; however, wife is Therapist, sports, so he could take in emergency if necessary.   Interval history: pt states cbg's are well-controlled.  pt states he feels well in general.  Past Medical History:  Diagnosis Date  . ABNORMAL ELECTROCARDIOGRAM 07/26/2009  . DEPRESSION 03/31/2007  . DIABETES MELLITUS, TYPE II 03/31/2007  . Dyspepsia   . 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 FATTY LIVER DISEASE 07/18/2008  . GERD 07/26/2009  . HSV-1 infection   . HYPERCHOLESTEROLEMIA 10/31/2010  . HYPERTENSION 03/31/2007  . NASH (nonalcoholic steatohepatitis)   . Personal history of colonic adenoma 02/10/2013    Past Surgical History:  Procedure Laterality Date  . COLONOSCOPY     2014 1 adenoma 2019 none  . hernia repair    . Stress Cardiolite  11/22/2003    Social History   Socioeconomic History  . Marital status: Married    Spouse name: Not on file  . Number of children: Not on file  . Years of education: Not on file  . Highest education level: Not on file  Occupational History  . Occupation: Unemployed  Tobacco Use  . Smoking status: Never Smoker  . Smokeless tobacco: Never Used  Substance and Sexual Activity  . Alcohol use: Yes    Comment: rarely  . Drug use: No  . Sexual activity: Not on file  Other Topics Concern  . Not on file  Social History Narrative  . Not on file   Social Determinants of Health   Financial Resource Strain:   . Difficulty of Paying Living Expenses:   Food Insecurity:   . Worried About Charity fundraiser in the Last Year:   . Arboriculturist in the Last Year:   Transportation Needs:   . Lexicographer (Medical):   Marland Kitchen Lack of Transportation (Non-Medical):   Physical Activity:   . Days of Exercise per Week:   . Minutes of Exercise per Session:   Stress:   . Feeling of Stress :   Social Connections:   . Frequency of Communication with Friends and Family:   . Frequency of Social Gatherings with Friends and Family:   . Attends Religious Services:   . Active Member of Clubs or Organizations:   . Attends Archivist Meetings:   Marland Kitchen Marital Status:   Intimate Partner Violence:   . Fear of Current or Ex-Partner:   . Emotionally Abused:   Marland Kitchen Physically Abused:   . Sexually Abused:     Current Outpatient Medications on File Prior to Visit  Medication Sig Dispense Refill  . aspirin 81 MG tablet Take 1 tablet (81 mg total) by mouth daily. 30 tablet 0  . atorvastatin (LIPITOR) 40 MG tablet Take 1 tablet (40 mg total) by mouth daily. 90 tablet 3  . docusate sodium (COLACE) 100 MG capsule Take 1 capsule (100 mg total) by mouth 2 (two) times daily. 10 capsule 0  . glucose blood (FREESTYLE TEST STRIPS) test strip Use to test blood sugar once daily 100 each 3  . glucose monitoring kit (FREESTYLE) monitoring kit 1 each by Does  not apply route as needed for other. 1 each 0  . INVOKANA 100 MG TABS tablet Take 100 mg by mouth daily.    . Lancets (FREESTYLE) lancets Use to test blood sugar once daily 100 each 3  . lisinopril (ZESTRIL) 5 MG tablet TAKE 1 TABLET BY MOUTH ONCE DAILY (FUTURE REFILLS WILL NEED TO COME FROM PCP) 90 tablet 2  . methocarbamol (ROBAXIN) 500 MG tablet TAKE 1 TABLET BY MOUTH AT BEDTIME AS NEEDED FOR MUSCLE SPASMS AND FOR LEG CRAMPS 90 tablet 1  . pantoprazole (PROTONIX) 40 MG tablet TAKE 1 TABLET BY MOUTH ONCE A DAY 90 tablet 0  . pioglitazone (ACTOS) 45 MG tablet Take 1 tablet (45 mg total) by mouth daily. 90 tablet 3  . repaglinide (PRANDIN) 2 MG tablet TAKE 2 TABLETS BY MOUTH 3 TIMES DAILY BEFORE MEALS. 540 tablet 3  . triamcinolone (KENALOG) 0.025 %  ointment Apply to affected area three times a day as needed for rash 80 g 3  . vitamin B-12 (CYANOCOBALAMIN) 500 MCG tablet Take 1 tablet (500 mcg total) by mouth daily. 90 tablet 2   No current facility-administered medications on file prior to visit.    No Known Allergies  Family History  Problem Relation Age of Onset  . Diabetes Mother   . Diabetes Brother   . Cancer Neg Hx   . Colon cancer Neg Hx   . Esophageal cancer Neg Hx   . Stomach cancer Neg Hx   . Rectal cancer Neg Hx   . Colon polyps Neg Hx     BP (!) 130/70   Pulse 72   Ht '5\' 2"'$  (1.575 m)   Wt 143 lb (64.9 kg)   SpO2 98%   BMI 26.16 kg/m    Review of Systems He denies hypoglycemia and nausea.      Objective:   Physical Exam VITAL SIGNS:  See vs page GENERAL: no distress Pulses: dorsalis pedis intact bilat.   MSK: no deformity of the feet CV: no leg edema Skin:  no ulcer on the feet.  normal color and temp on the feet. Neuro: sensation is intact to touch on the feet.    Lab Results  Component Value Date   HGBA1C 6.9 (A) 03/24/2020        Assessment & Plan:  Type 2 DM, well-controlled  Patient Instructions  Please continue the same diabetes medications.   check your blood sugar once a day.  vary the time of day when you check, between before the 3 meals, and at bedtime.  also check if you have symptoms of your blood sugar being too high or too low.  please keep a record of the readings and bring it to your next appointment here (or you can bring the meter itself).  You can write it on any piece of paper.  please call us sooner if your blood sugar goes below 70, or if you have a lot of readings over 200. Please come back for a follow-up appointment in 4 months.

## 2020-03-24 NOTE — Patient Instructions (Signed)
Please continue the same diabetes medications.   check your blood sugar once a day.  vary the time of day when you check, between before the 3 meals, and at bedtime.  also check if you have symptoms of your blood sugar being too high or too low.  please keep a record of the readings and bring it to your next appointment here (or you can bring the meter itself).  You can write it on any piece of paper.  please call us sooner if your blood sugar goes below 70, or if you have a lot of readings over 200.   Please come back for a follow-up appointment in 4 months.   

## 2020-04-18 ENCOUNTER — Other Ambulatory Visit: Payer: Self-pay | Admitting: Endocrinology

## 2020-04-18 MED FILL — REPAGLINIDE 2 MG TABLET: 2 | 90 days supply | Qty: 540 | Fill #0

## 2020-04-18 MED FILL — ATORVASTATIN CALCIUM 40 MG: 40 | 90 days supply | Qty: 90 | Fill #3

## 2020-05-22 MED FILL — LISINOPRIL 5 MG TABLET: 5 | 90 days supply | Qty: 90 | Fill #2

## 2020-06-22 MED FILL — RYBELSUS 7 MG TABS: 7 | 90 days supply | Qty: 90 | Fill #1

## 2020-07-17 LAB — HM DIABETES EYE EXAM

## 2020-07-21 MED FILL — REPAGLINIDE 2 MG TABLET: 2 | 90 days supply | Qty: 540 | Fill #1

## 2020-07-21 MED FILL — BROMOCRIPTINE 2.5 MG TABLET: 2.5 | 90 days supply | Qty: 90 | Fill #1

## 2020-07-21 MED FILL — ATORVASTATIN 40 MG TABLET: 40 | 90 days supply | Qty: 90 | Fill #0

## 2020-08-04 ENCOUNTER — Ambulatory Visit (INDEPENDENT_AMBULATORY_CARE_PROVIDER_SITE_OTHER): Payer: No Typology Code available for payment source | Admitting: Endocrinology

## 2020-08-04 ENCOUNTER — Other Ambulatory Visit: Payer: Self-pay

## 2020-08-04 ENCOUNTER — Encounter: Payer: Self-pay | Admitting: Endocrinology

## 2020-08-04 ENCOUNTER — Other Ambulatory Visit: Payer: Self-pay | Admitting: Endocrinology

## 2020-08-04 VITALS — BP 130/78 | HR 72 | Ht 65.0 in | Wt 135.6 lb

## 2020-08-04 DIAGNOSIS — E119 Type 2 diabetes mellitus without complications: Secondary | ICD-10-CM | POA: Diagnosis not present

## 2020-08-04 DIAGNOSIS — Z23 Encounter for immunization: Secondary | ICD-10-CM | POA: Diagnosis not present

## 2020-08-04 LAB — POCT GLYCOSYLATED HEMOGLOBIN (HGB A1C): Hemoglobin A1C: 7.4 % — AB (ref 4.0–5.6)

## 2020-08-04 MED ORDER — REPAGLINIDE 2 MG PO TABS: 2.0000 mg | ORAL_TABLET | Freq: Every day | ORAL | 3 refills | Status: DC

## 2020-08-04 MED ORDER — PIOGLITAZONE HCL 45 MG PO TABS: 45.0000 mg | ORAL_TABLET | Freq: Every day | ORAL | 3 refills | Status: DC

## 2020-08-04 MED FILL — PIOGLITAZONE HCL 45 MG TABS: 45 | 90 days supply | Qty: 90 | Fill #0

## 2020-08-04 NOTE — Progress Notes (Signed)
Subjective:    Patient ID: Elijah Cervantes, male    DOB: 11-23-1955, 64 y.o.   MRN: [id number]  HPI Pt returns for f/u of diabetes mellitus:  DM type: 2 Dx'ed: 3151 Complications: none Therapy: 6 oral meds DKA: never Severe hypoglycemia: never.   Pancreatitis: never.   [place]: he works 3rd shift Other: he has declined acarbose and welchol; he declines insulin; however, wife is Therapist, sports, so he could take in emergency if necessary.   Interval history: pt states cbg's are well-controlled.  He has mild hypoglycemia approx twice a week. He takes med as rx'ed , except for pioglitazone.   Past Medical History:  Diagnosis Date  . ABNORMAL ELECTROCARDIOGRAM 07/26/2009  . DEPRESSION 03/31/2007  . DIABETES MELLITUS, TYPE II 03/31/2007  . Dyspepsia   . 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 FATTY LIVER DISEASE 07/18/2008  . GERD 07/26/2009  . HSV-1 infection   . HYPERCHOLESTEROLEMIA 10/31/2010  . HYPERTENSION 03/31/2007  . NASH (nonalcoholic steatohepatitis)   . Personal history of colonic adenoma 02/10/2013    Past Surgical History:  Procedure Laterality Date  . COLONOSCOPY     2014 1 adenoma 2019 none  . hernia repair    . Stress Cardiolite  11/22/2003    Social History   Socioeconomic History  . Marital status: Married    Spouse name: Not on file  . Number of children: Not on file  . Years of education: Not on file  . Highest education level: Not on file  Occupational History  . Occupation: Unemployed  Tobacco Use  . Smoking status: Never Smoker  . Smokeless tobacco: Never Used  Substance and Sexual Activity  . Alcohol use: Yes    Comment: rarely  . Drug use: No  . Sexual activity: Not on file  Other Topics Concern  . Not on file  Social History Narrative  . Not on file   Social Determinants of Health   Financial Resource Strain:   . Difficulty of Paying Living Expenses: Not on file  Food Insecurity:   . Worried About Charity fundraiser in the Last Year: Not on  file  . Ran Out of Food in the Last Year: Not on file  Transportation Needs:   . Lack of Transportation (Medical): Not on file  . Lack of Transportation (Non-Medical): Not on file  Physical Activity:   . Days of Exercise per Week: Not on file  . Minutes of Exercise per Session: Not on file  Stress:   . Feeling of Stress : Not on file  Social Connections:   . Frequency of Communication with Friends and Family: Not on file  . Frequency of Social Gatherings with Friends and Family: Not on file  . Attends Religious Services: Not on file  . Active Member of Clubs or Organizations: Not on file  . Attends Archivist Meetings: Not on file  . Marital Status: Not on file  Intimate Partner Violence:   . Fear of Current or Ex-Partner: Not on file  . Emotionally Abused: Not on file  . Physically Abused: Not on file  . Sexually Abused: Not on file    Current Outpatient Medications on File Prior to Visit  Medication Sig Dispense Refill  . aspirin 81 MG tablet Take 1 tablet (81 mg total) by mouth daily. 30 tablet 0  . atorvastatin (LIPITOR) 40 MG tablet Take 1 tablet (40 mg total) by mouth daily. 90 tablet 3  . bromocriptine (PARLODEL) 2.5 MG tablet  Take 1 tablet (2.5 mg total) by mouth daily. 90 tablet 3  . empagliflozin (JARDIANCE) 10 MG TABS tablet Take 1 tablet (10 mg total) by mouth daily before breakfast. 90 tablet 3  . glucose blood (FREESTYLE TEST STRIPS) test strip Use to test blood sugar once daily 100 each 3  . glucose monitoring kit (FREESTYLE) monitoring kit 1 each by Does not apply route as needed for other. 1 each 0  . Lancets (FREESTYLE) lancets Use to test blood sugar once daily 100 each 3  . lisinopril (ZESTRIL) 5 MG tablet TAKE 1 TABLET BY MOUTH ONCE DAILY (FUTURE REFILLS WILL NEED TO COME FROM PCP) 90 tablet 2  . metFORMIN (GLUCOPHAGE-XR) 500 MG 24 hr tablet Take 3 tablets (1,500 mg total) by mouth daily. 270 tablet 3  . methocarbamol (ROBAXIN) 500 MG tablet TAKE 1  TABLET BY MOUTH AT BEDTIME AS NEEDED FOR MUSCLE SPASMS AND FOR LEG CRAMPS 90 tablet 1  . pantoprazole (PROTONIX) 40 MG tablet TAKE 1 TABLET BY MOUTH ONCE A DAY 90 tablet 0  . Semaglutide (RYBELSUS) 7 MG TABS Take 7 mg by mouth daily. 90 tablet 3  . triamcinolone (KENALOG) 0.025 % ointment Apply to affected area three times a day as needed for rash 80 g 3   No current facility-administered medications on file prior to visit.    No Known Allergies  Family History  Problem Relation Age of Onset  . Diabetes Mother   . Diabetes Brother   . Cancer Neg Hx   . Colon cancer Neg Hx   . Esophageal cancer Neg Hx   . Stomach cancer Neg Hx   . Rectal cancer Neg Hx   . Colon polyps Neg Hx     BP 130/78   Pulse 72   Ht $R'5\' 5"'ls$  (1.651 m)   Wt 135 lb 9.6 oz (61.5 kg)   SpO2 98%   BMI 22.57 kg/m    Review of Systems He has lost 8 lbs since last ov.      Objective:   Physical Exam VITAL SIGNS:  See vs page GENERAL: no distress Pulses: dorsalis pedis intact bilat.   MSK: no deformity of the feet CV: no leg edema Skin:  no ulcer on the feet.  normal color and temp on the feet. Neuro: sensation is intact to touch on the feet.    Lab Results  Component Value Date   HGBA1C 7.4 (A) 08/04/2020        Assessment & Plan:  Type 2 DM: uncontrolled Hypoglycemia, due to repaglinide  Patient Instructions  I have sent a prescription to your pharmacy, to resume the pioglitazone, and: Please reduce the repaglinide to with supper only. check your blood sugar once a day.  vary the time of day when you check, between before the 3 meals, and at bedtime.  also check if you have symptoms of your blood sugar being too high or too low.  please keep a record of the readings and bring it to your next appointment here (or you can bring the meter itself).  You can write it on any piece of paper.  please call us sooner if your blood sugar goes below 70, or if you have a lot of readings over 200. Please  continue the same other diabetes medications. Please come back for a follow-up appointment in 3 months.

## 2020-08-04 NOTE — Patient Instructions (Signed)
I have sent a prescription to your pharmacy, to resume the pioglitazone, and: Please reduce the repaglinide to with supper only. check your blood sugar once a day.  vary the time of day when you check, between before the 3 meals, and at bedtime.  also check if you have symptoms of your blood sugar being too high or too low.  please keep a record of the readings and bring it to your next appointment here (or you can bring the meter itself).  You can write it on any piece of paper.  please call us sooner if your blood sugar goes below 70, or if you have a lot of readings over 200. Please continue the same other diabetes medications. Please come back for a follow-up appointment in 3 months.

## 2020-08-14 ENCOUNTER — Other Ambulatory Visit (HOSPITAL_BASED_OUTPATIENT_CLINIC_OR_DEPARTMENT_OTHER): Payer: Self-pay | Admitting: Internal Medicine

## 2020-08-14 ENCOUNTER — Ambulatory Visit: Payer: No Typology Code available for payment source | Attending: Internal Medicine

## 2020-08-14 DIAGNOSIS — Z23 Encounter for immunization: Secondary | ICD-10-CM

## 2020-08-21 ENCOUNTER — Other Ambulatory Visit: Payer: Self-pay | Admitting: Family Medicine

## 2020-08-21 DIAGNOSIS — I10 Essential (primary) hypertension: Secondary | ICD-10-CM

## 2020-08-21 MED FILL — MODERNA COVID-19 VACCINE 10: 100 | 1 days supply | Qty: 1 | Fill #0

## 2020-08-21 MED FILL — LISINOPRIL 5 MG TABLET: 5 | 90 days supply | Qty: 90 | Fill #0

## 2020-08-21 NOTE — Telephone Encounter (Signed)
Last OV 02/28/20 Last fill 10/28/19  #90/2

## 2020-09-05 ENCOUNTER — Other Ambulatory Visit: Payer: Self-pay | Admitting: Endocrinology

## 2020-09-05 ENCOUNTER — Other Ambulatory Visit: Payer: Self-pay | Admitting: Family Medicine

## 2020-09-05 MED FILL — FREESTYLE LITE TEST STRIP: 90 days supply | Qty: 100 | Fill #0

## 2020-09-06 ENCOUNTER — Other Ambulatory Visit: Payer: Self-pay | Admitting: Family Medicine

## 2020-09-06 MED FILL — PANTOPRAZOLE SOD DR 40 MG T: 40 | 90 days supply | Qty: 90 | Fill #0

## 2020-09-06 NOTE — Telephone Encounter (Signed)
Last OV 02/28/20 Last fill 09/13/19  #90/0

## 2020-09-11 ENCOUNTER — Other Ambulatory Visit (HOSPITAL_BASED_OUTPATIENT_CLINIC_OR_DEPARTMENT_OTHER): Payer: Self-pay | Admitting: Internal Medicine

## 2020-09-11 ENCOUNTER — Ambulatory Visit: Payer: No Typology Code available for payment source | Attending: Internal Medicine

## 2020-09-11 DIAGNOSIS — Z23 Encounter for immunization: Secondary | ICD-10-CM

## 2020-09-11 NOTE — Progress Notes (Signed)
   Covid-19 Vaccination Clinic  Name:  Elijah Cervantes    MRN: [id number] DOB: [birth year]  09/11/2020  Mr. Elijah Cervantes was observed post Covid-19 immunization for 15 minutes without incident. He was provided with Vaccine Information Sheet and instruction to access the V-Safe system.   Mr. Elijah Cervantes was instructed to call 911 with any severe reactions post vaccine: Marland Kitchen Difficulty breathing  . Swelling of face and throat  . A fast heartbeat  . A bad rash all over body  . Dizziness and weakness   Immunizations Administered    Name Date Dose VIS Date Route   Moderna COVID-19 Vaccine 09/11/2020 11:05 AM 0.5 mL 06/21/2020 Intramuscular   Manufacturer: Levan Hurst   Lot: [id number]   Tome: 83151-761-60

## 2020-09-12 MED FILL — MODERNA COVID-19 VACCINE 10: 100 | 28 days supply | Qty: 1 | Fill #0

## 2020-09-18 MED FILL — RYBELSUS 7 MG TABS: 7 | 90 days supply | Qty: 90 | Fill #2

## 2020-09-22 ENCOUNTER — Encounter: Payer: No Typology Code available for payment source | Admitting: Family Medicine

## 2020-10-23 MED FILL — REPAGLINIDE 2 MG TABLET: 2 | 90 days supply | Qty: 540 | Fill #2

## 2020-10-23 MED FILL — BROMOCRIPTINE 2.5 MG TABLET: 2.5 | 90 days supply | Qty: 90 | Fill #2

## 2020-10-23 MED FILL — ATORVASTATIN 40 MG TABLET: 40 | 90 days supply | Qty: 90 | Fill #1

## 2020-10-23 MED FILL — metFORMIN HCL ER 500 MG TB2: 500 | 90 days supply | Qty: 270 | Fill #1

## 2020-10-23 MED FILL — JARDIANCE 10 MG TABLET: 10 | 90 days supply | Qty: 90 | Fill #1

## 2020-11-07 ENCOUNTER — Other Ambulatory Visit: Payer: Self-pay | Admitting: Endocrinology

## 2020-11-07 ENCOUNTER — Other Ambulatory Visit: Payer: Self-pay

## 2020-11-07 ENCOUNTER — Ambulatory Visit (INDEPENDENT_AMBULATORY_CARE_PROVIDER_SITE_OTHER): Payer: No Typology Code available for payment source | Admitting: Endocrinology

## 2020-11-07 VITALS — BP 130/70 | HR 80 | Ht 65.0 in | Wt 132.4 lb

## 2020-11-07 DIAGNOSIS — E119 Type 2 diabetes mellitus without complications: Secondary | ICD-10-CM | POA: Diagnosis not present

## 2020-11-07 LAB — POCT GLYCOSYLATED HEMOGLOBIN (HGB A1C): Hemoglobin A1C: 7.2 % — AB (ref 4.0–5.6)

## 2020-11-07 MED ORDER — RYBELSUS 14 MG PO TABS: 14.0000 mg | ORAL_TABLET | Freq: Every day | ORAL | 3 refills | Status: DC

## 2020-11-07 MED FILL — RYBELSUS 14 MG TABS: 14 | 90 days supply | Qty: 90 | Fill #0

## 2020-11-07 NOTE — Progress Notes (Signed)
Subjective:    Patient ID: Elijah Cervantes, male    DOB: 1955-12-29, 65 y.o.   MRN: [id number]  HPI Pt returns for f/u of diabetes mellitus:  DM type: 2 Dx'ed: 9390 Complications: none Therapy: 6 oral meds DKA: never Severe hypoglycemia: never.   Pancreatitis: never.   Mulat: he works 3rd shift Other: he has declined acarbose and welchol; he declines insulin; however, wife is Therapist, sports, so he could take in emergency if necessary.   Interval history: pt states cbg's vary from 80-120.  He takes med as rx'ed.  pt states he feels well in general.   Past Medical History:  Diagnosis Date  . ABNORMAL ELECTROCARDIOGRAM 07/26/2009  . DEPRESSION 03/31/2007  . DIABETES MELLITUS, TYPE II 03/31/2007  . Dyspepsia   . 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 FATTY LIVER DISEASE 07/18/2008  . GERD 07/26/2009  . HSV-1 infection   . HYPERCHOLESTEROLEMIA 10/31/2010  . HYPERTENSION 03/31/2007  . NASH (nonalcoholic steatohepatitis)   . Personal history of colonic adenoma 02/10/2013    Past Surgical History:  Procedure Laterality Date  . COLONOSCOPY     2014 1 adenoma 2019 none  . hernia repair    . Stress Cardiolite  11/22/2003    Social History   Socioeconomic History  . Marital status: Married    Spouse name: Not on file  . Number of children: Not on file  . Years of education: Not on file  . Highest education level: Not on file  Occupational History  . Occupation: Unemployed  Tobacco Use  . Smoking status: Never Smoker  . Smokeless tobacco: Never Used  Substance and Sexual Activity  . Alcohol use: Yes    Comment: rarely  . Drug use: No  . Sexual activity: Not on file  Other Topics Concern  . Not on file  Social History Narrative  . Not on file   Social Determinants of Health   Financial Resource Strain: Not on file  Food Insecurity: Not on file  Transportation Needs: Not on file  Physical Activity: Not on file  Stress: Not on file  Social Connections: Not on file  Intimate  Partner Violence: Not on file    Current Outpatient Medications on File Prior to Visit  Medication Sig Dispense Refill  . aspirin 81 MG tablet Take 1 tablet (81 mg total) by mouth daily. 30 tablet 0  . bromocriptine (PARLODEL) 2.5 MG tablet Take 1 tablet (2.5 mg total) by mouth daily. 90 tablet 3  . empagliflozin (JARDIANCE) 10 MG TABS tablet Take 1 tablet (10 mg total) by mouth daily before breakfast. 90 tablet 3  . glucose blood (FREESTYLE LITE) test strip USE TO TEST BLOOD SUGAR ONCE DAILY 100 strip 3  . glucose monitoring kit (FREESTYLE) monitoring kit 1 each by Does not apply route as needed for other. 1 each 0  . Lancets (FREESTYLE) lancets Use to test blood sugar once daily 100 each 3  . metFORMIN (GLUCOPHAGE-XR) 500 MG 24 hr tablet Take 3 tablets (1,500 mg total) by mouth daily. 270 tablet 3  . methocarbamol (ROBAXIN) 500 MG tablet TAKE 1 TABLET BY MOUTH AT BEDTIME AS NEEDED FOR MUSCLE SPASMS AND FOR LEG CRAMPS (Patient not taking: Reported on 11/08/2020) 90 tablet 1  . pantoprazole (PROTONIX) 40 MG tablet TAKE 1 TABLET BY MOUTH ONCE A DAY 90 tablet 0  . pioglitazone (ACTOS) 45 MG tablet Take 1 tablet (45 mg total) by mouth daily. 90 tablet 3  . repaglinide (PRANDIN) 2 MG  tablet Take 1 tablet (2 mg total) by mouth daily with supper. 90 tablet 3  . triamcinolone (KENALOG) 0.025 % ointment Apply to affected area three times a day as needed for rash (Patient not taking: Reported on 11/08/2020) 80 g 3   No current facility-administered medications on file prior to visit.    Not on File  Family History  Problem Relation Age of Onset  . Diabetes Mother   . Diabetes Brother   . Cancer Neg Hx   . Colon cancer Neg Hx   . Esophageal cancer Neg Hx   . Stomach cancer Neg Hx   . Rectal cancer Neg Hx   . Colon polyps Neg Hx     BP 130/70 (BP Location: Right Arm, Patient Position: Sitting, Cuff Size: Normal)   Pulse 80   Ht $R'5\' 5"'md$  (1.651 m)   Wt 132 lb 6.4 oz (60.1 kg)   SpO2 98%   BMI  22.03 kg/m    Review of Systems He denies hypoglycemia    Objective:   Physical Exam VITAL SIGNS:  See vs page GENERAL: no distress Pulses: dorsalis pedis intact bilat.   MSK: no deformity of the feet CV: no leg edema Skin:  no ulcer on the feet.  normal color and temp on the feet.  Neuro: sensation is intact to touch on the feet.   A1c=7.2%     Assessment & Plan:  Type 2 DM: uncontrolled.    Patient Instructions  I have sent a prescription to your pharmacy, to increase the Rybelsus, and:  Please continue the same other diabetes medications.   check your blood sugar once a day.  vary the time of day when you check, between before the 3 meals, and at bedtime.  also check if you have symptoms of your blood sugar being too high or too low.  please keep a record of the readings and bring it to your next appointment here (or you can bring the meter itself).  You can write it on any piece of paper.  please call us sooner if your blood sugar goes below 70, or if you have a lot of readings over 200.   Please come back for a follow-up appointment in 4 months.

## 2020-11-07 NOTE — Patient Instructions (Addendum)
I have sent a prescription to your pharmacy, to increase the Rybelsus, and:  Please continue the same other diabetes medications.   check your blood sugar once a day.  vary the time of day when you check, between before the 3 meals, and at bedtime.  also check if you have symptoms of your blood sugar being too high or too low.  please keep a record of the readings and bring it to your next appointment here (or you can bring the meter itself).  You can write it on any piece of paper.  please call us sooner if your blood sugar goes below 70, or if you have a lot of readings over 200.   Please come back for a follow-up appointment in 4 months.

## 2020-11-08 ENCOUNTER — Other Ambulatory Visit: Payer: Self-pay | Admitting: Family Medicine

## 2020-11-08 ENCOUNTER — Ambulatory Visit (INDEPENDENT_AMBULATORY_CARE_PROVIDER_SITE_OTHER): Payer: No Typology Code available for payment source | Admitting: Family Medicine

## 2020-11-08 ENCOUNTER — Encounter: Payer: Self-pay | Admitting: Family Medicine

## 2020-11-08 VITALS — BP 124/72 | HR 81 | Temp 97.0°F | Ht 66.0 in | Wt 131.0 lb

## 2020-11-08 DIAGNOSIS — E538 Deficiency of other specified B group vitamins: Secondary | ICD-10-CM

## 2020-11-08 DIAGNOSIS — I10 Essential (primary) hypertension: Secondary | ICD-10-CM | POA: Diagnosis not present

## 2020-11-08 DIAGNOSIS — E7800 Pure hypercholesterolemia, unspecified: Secondary | ICD-10-CM

## 2020-11-08 MED ORDER — ATORVASTATIN CALCIUM 40 MG PO TABS: 40.0000 mg | ORAL_TABLET | Freq: Every day | ORAL | 3 refills | Status: DC

## 2020-11-08 MED ORDER — LISINOPRIL 5 MG PO TABS: ORAL_TABLET | ORAL | 2 refills | Status: DC

## 2020-11-08 MED FILL — LISINOPRIL 5 MG TABLET: 5 | 90 days supply | Qty: 90 | Fill #0

## 2020-11-08 NOTE — Progress Notes (Signed)
Established Patient Office Visit  Subjective:  Patient ID: Elijah Cervantes, male    DOB: July 05, 1956  Age: 65 y.o. MRN: [id number]  CC:  Chief Complaint  Patient presents with  . Annual Exam    CPE, no concerns.     HPI Elijah Cervantes presents for follow-up of follow-up of hypertension, elevated cholesterol and B12 deficiency.  Cholesterol and blood pressures been well controlled with current therapy.  B12 levels were adequate and last visit and will be rechecked again today.  He has been doing mostly well otherwise.  He works 6 days a week at night.  Sometimes has difficulty sleeping during the day when he is stressed.  Constipation has resolved.  He is no longer using Colace.  Diabetes is controlled through endocrinology.  Past Medical History:  Diagnosis Date  . ABNORMAL ELECTROCARDIOGRAM 07/26/2009  . DEPRESSION 03/31/2007  . DIABETES MELLITUS, TYPE II 03/31/2007  . Dyspepsia   . 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 FATTY LIVER DISEASE 07/18/2008  . GERD 07/26/2009  . HSV-1 infection   . HYPERCHOLESTEROLEMIA 10/31/2010  . HYPERTENSION 03/31/2007  . NASH (nonalcoholic steatohepatitis)   . Personal history of colonic adenoma 02/10/2013    Past Surgical History:  Procedure Laterality Date  . COLONOSCOPY     2014 1 adenoma 2019 none  . hernia repair    . Stress Cardiolite  11/22/2003    Family History  Problem Relation Age of Onset  . Diabetes Mother   . Diabetes Brother   . Cancer Neg Hx   . Colon cancer Neg Hx   . Esophageal cancer Neg Hx   . Stomach cancer Neg Hx   . Rectal cancer Neg Hx   . Colon polyps Neg Hx     Social History   Socioeconomic History  . Marital status: Married    Spouse name: Not on file  . Number of children: Not on file  . Years of education: Not on file  . Highest education level: Not on file  Occupational History  . Occupation: Unemployed  Tobacco Use  . Smoking status: Never Smoker  . Smokeless tobacco: Never Used   Substance and Sexual Activity  . Alcohol use: Yes    Comment: rarely  . Drug use: No  . Sexual activity: Not on file  Other Topics Concern  . Not on file  Social History Narrative  . Not on file   Social Determinants of Health   Financial Resource Strain: Not on file  Food Insecurity: Not on file  Transportation Needs: Not on file  Physical Activity: Not on file  Stress: Not on file  Social Connections: Not on file  Intimate Partner Violence: Not on file    Outpatient Medications Prior to Visit  Medication Sig Dispense Refill  . aspirin 81 MG tablet Take 1 tablet (81 mg total) by mouth daily. 30 tablet 0  . bromocriptine (PARLODEL) 2.5 MG tablet Take 1 tablet (2.5 mg total) by mouth daily. 90 tablet 3  . empagliflozin (JARDIANCE) 10 MG TABS tablet Take 1 tablet (10 mg total) by mouth daily before breakfast. 90 tablet 3  . glucose blood (FREESTYLE LITE) test strip USE TO TEST BLOOD SUGAR ONCE DAILY 100 strip 3  . glucose monitoring kit (FREESTYLE) monitoring kit 1 each by Does not apply route as needed for other. 1 each 0  . Lancets (FREESTYLE) lancets Use to test blood sugar once daily 100 each 3  . metFORMIN (GLUCOPHAGE-XR) 500 MG 24 hr  tablet Take 3 tablets (1,500 mg total) by mouth daily. 270 tablet 3  . pantoprazole (PROTONIX) 40 MG tablet TAKE 1 TABLET BY MOUTH ONCE A DAY 90 tablet 0  . pioglitazone (ACTOS) 45 MG tablet Take 1 tablet (45 mg total) by mouth daily. 90 tablet 3  . repaglinide (PRANDIN) 2 MG tablet Take 1 tablet (2 mg total) by mouth daily with supper. 90 tablet 3  . Semaglutide (RYBELSUS) 14 MG TABS Take 14 mg by mouth daily. 90 tablet 3  . atorvastatin (LIPITOR) 40 MG tablet Take 1 tablet (40 mg total) by mouth daily. 90 tablet 3  . lisinopril (ZESTRIL) 5 MG tablet TAKE 1 TABLET BY MOUTH ONCE DAILY (FUTURE REFILLS WILL NEED TO COME FROM PCP) 90 tablet 2  . methocarbamol (ROBAXIN) 500 MG tablet TAKE 1 TABLET BY MOUTH AT BEDTIME AS NEEDED FOR MUSCLE SPASMS AND  FOR LEG CRAMPS (Patient not taking: Reported on 11/08/2020) 90 tablet 1  . triamcinolone (KENALOG) 0.025 % ointment Apply to affected area three times a day as needed for rash (Patient not taking: Reported on 11/08/2020) 80 g 3   No facility-administered medications prior to visit.    No Known Allergies  ROS Review of Systems  Constitutional: Negative.   HENT: Negative.   Eyes: Negative for photophobia and visual disturbance.  Respiratory: Negative.   Cardiovascular: Negative.   Gastrointestinal: Negative.   Endocrine: Negative for polyphagia and polyuria.  Musculoskeletal: Negative for gait problem and joint swelling.  Skin: Negative.   Neurological: Negative.   Hematological: Does not bruise/bleed easily.  Psychiatric/Behavioral: Positive for sleep disturbance.      Objective:    Physical Exam Vitals and nursing note reviewed.  Constitutional:      General: He is not in acute distress.    Appearance: Normal appearance. He is normal weight. He is not ill-appearing, toxic-appearing or diaphoretic.  HENT:     Head: Normocephalic and atraumatic.     Right Ear: Tympanic membrane, ear canal and external ear normal.     Left Ear: Tympanic membrane, ear canal and external ear normal.  Neck:     Vascular: No carotid bruit.  Cardiovascular:     Rate and Rhythm: Normal rate and regular rhythm.  Pulmonary:     Effort: Pulmonary effort is normal.     Breath sounds: Normal breath sounds.  Abdominal:     General: Abdomen is flat. Bowel sounds are normal. There is no distension.     Palpations: Abdomen is soft. There is no mass.     Tenderness: There is no abdominal tenderness. There is no guarding or rebound.     Hernia: No hernia is present.  Musculoskeletal:     Cervical back: Neck supple. No rigidity or tenderness.     Right lower leg: No edema.     Left lower leg: No edema.  Lymphadenopathy:     Cervical: No cervical adenopathy.  Skin:    General: Skin is warm and dry.   Neurological:     Mental Status: He is alert and oriented to person, place, and time.  Psychiatric:        Mood and Affect: Mood normal.        Behavior: Behavior normal.     BP 124/72   Pulse 81   Temp (!) 97 F (36.1 C) (Temporal)   Ht $R'5\' 6"'nW$  (1.676 m)   Wt 131 lb (59.4 kg)   SpO2 96%   BMI 21.14 kg/m  Wt Readings  from Last 3 Encounters:  11/08/20 131 lb (59.4 kg)  11/07/20 132 lb 6.4 oz (60.1 kg)  08/04/20 135 lb 9.6 oz (61.5 kg)     Health Maintenance Due  Topic Date Due  . PNA vac Low Risk Adult (1 of 2 - PCV13) 02/14/2020    There are no preventive care reminders to display for this patient.  Lab Results  Component Value Date   TSH 1.47 11/25/2019   Lab Results  Component Value Date   WBC 5.6 02/28/2020   HGB 13.6 02/28/2020   HCT 40.9 02/28/2020   MCV 97.3 02/28/2020   PLT 205.0 02/28/2020   Lab Results  Component Value Date   NA 138 02/28/2020   NA 138 02/28/2020   K 3.8 02/28/2020   K 3.8 02/28/2020   CO2 28 02/28/2020   CO2 28 02/28/2020   GLUCOSE 99 02/28/2020   GLUCOSE 99 02/28/2020   BUN 27 (H) 02/28/2020   BUN 27 (H) 02/28/2020   CREATININE 1.07 02/28/2020   CREATININE 1.07 02/28/2020   BILITOT 0.5 02/28/2020   ALKPHOS 51 02/28/2020   AST 22 02/28/2020   ALT 24 02/28/2020   PROT 6.7 02/28/2020   ALBUMIN 4.5 02/28/2020   CALCIUM 9.8 02/28/2020   CALCIUM 9.8 02/28/2020   GFR 69.35 02/28/2020   GFR 69.35 02/28/2020   Lab Results  Component Value Date   CHOL 124 10/28/2019   Lab Results  Component Value Date   HDL 59.00 10/28/2019   Lab Results  Component Value Date   LDLCALC 55 10/28/2019   Lab Results  Component Value Date   TRIG 51.0 10/28/2019   Lab Results  Component Value Date   CHOLHDL 2 10/28/2019   Lab Results  Component Value Date   HGBA1C 7.2 (A) 11/07/2020      Assessment & Plan:   Problem List Items Addressed This Visit      Cardiovascular and Mediastinum   Essential hypertension   Relevant  Medications   atorvastatin (LIPITOR) 40 MG tablet   lisinopril (ZESTRIL) 5 MG tablet   Other Relevant Orders   CBC   Comprehensive metabolic panel   Urinalysis, Routine w reflex microscopic     Other   HYPERCHOLESTEROLEMIA - Primary   Relevant Medications   atorvastatin (LIPITOR) 40 MG tablet   lisinopril (ZESTRIL) 5 MG tablet   Other Relevant Orders   Lipid panel   B12 deficiency   Relevant Orders   Vitamin B12      Meds ordered this encounter  Medications  . atorvastatin (LIPITOR) 40 MG tablet    Sig: Take 1 tablet (40 mg total) by mouth daily.    Dispense:  90 tablet    Refill:  3  . lisinopril (ZESTRIL) 5 MG tablet    Sig: TAKE ONE DAILY    Dispense:  90 tablet    Refill:  2    Follow-up: Return in about 6 months (around 05/11/2021), or RETURN FASTING FOR BLOOD WORK ORDERED TODAY IN NEXT WEEK OR SO.Marland Kitchen Discussed possible treatments for sleep and he would like to hold off for now.  Continue current medicines.  Follow-up in the next week or so fasting above ordered blood work and in 6 months.   Libby Maw, MD

## 2020-11-14 ENCOUNTER — Other Ambulatory Visit: Payer: Self-pay

## 2020-11-14 ENCOUNTER — Other Ambulatory Visit (INDEPENDENT_AMBULATORY_CARE_PROVIDER_SITE_OTHER): Payer: No Typology Code available for payment source

## 2020-11-14 DIAGNOSIS — I10 Essential (primary) hypertension: Secondary | ICD-10-CM | POA: Diagnosis not present

## 2020-11-14 DIAGNOSIS — E7800 Pure hypercholesterolemia, unspecified: Secondary | ICD-10-CM

## 2020-11-14 DIAGNOSIS — E538 Deficiency of other specified B group vitamins: Secondary | ICD-10-CM

## 2020-11-14 LAB — URINALYSIS, ROUTINE W REFLEX MICROSCOPIC
Bilirubin Urine: NEGATIVE
Hgb urine dipstick: NEGATIVE
Ketones, ur: NEGATIVE
Leukocytes,Ua: NEGATIVE
Nitrite: NEGATIVE
RBC / HPF: NONE SEEN (ref 0–?)
Specific Gravity, Urine: 1.015 (ref 1.000–1.030)
Total Protein, Urine: NEGATIVE
Urine Glucose: >=1000 — AB
Urobilinogen, UA: 0.2 (ref 0.0–1.0)
WBC, UA: NONE SEEN (ref 0–?)
pH: 5.5 (ref 5.0–8.0)

## 2020-11-14 LAB — LIPID PANEL
Cholesterol: 103 mg/dL (ref 0–200)
HDL: 54.1 mg/dL (ref >39.00)
LDL Cholesterol: 38 mg/dL (ref 0–99)
NonHDL: 49
Total CHOL/HDL Ratio: 2
Triglycerides: 53 mg/dL (ref 0.0–149.0)
VLDL: 10.6 mg/dL (ref 0.0–40.0)

## 2020-11-14 LAB — CBC
HCT: 42.8 % (ref 39.0–52.0)
Hemoglobin: 14.3 g/dL (ref 13.0–17.0)
MCHC: 33.4 g/dL (ref 30.0–36.0)
MCV: 96.5 fl (ref 78.0–100.0)
Platelets: 224 10*3/uL (ref 150.0–400.0)
RBC: 4.43 Mil/uL (ref 4.22–5.81)
RDW: 12.8 % (ref 11.5–15.5)
WBC: 5.8 10*3/uL (ref 4.0–10.5)

## 2020-11-14 LAB — COMPREHENSIVE METABOLIC PANEL
ALT: 27 U/L (ref 0–53)
AST: 21 U/L (ref 0–37)
Albumin: 4.5 g/dL (ref 3.5–5.2)
Alkaline Phosphatase: 66 U/L (ref 39–117)
BUN: 23 mg/dL (ref 6–23)
CO2: 30 mEq/L (ref 19–32)
Calcium: 9.9 mg/dL (ref 8.4–10.5)
Chloride: 102 mEq/L (ref 96–112)
Creatinine, Ser: 0.95 mg/dL (ref 0.40–1.50)
GFR: 83.85 mL/min (ref >60.00)
Glucose, Bld: 139 mg/dL — ABNORMAL HIGH (ref 70–99)
Potassium: 4.2 mEq/L (ref 3.5–5.1)
Sodium: 138 mEq/L (ref 135–145)
Total Bilirubin: 0.7 mg/dL (ref 0.2–1.2)
Total Protein: 7.1 g/dL (ref 6.0–8.3)

## 2020-11-14 LAB — VITAMIN B12: Vitamin B-12: 756 pg/mL (ref 211–911)

## 2020-11-14 NOTE — Progress Notes (Signed)
Per orders of Dr. [name] pt is here for labs pt tolerated draw well.

## 2020-11-23 ENCOUNTER — Other Ambulatory Visit (HOSPITAL_BASED_OUTPATIENT_CLINIC_OR_DEPARTMENT_OTHER): Payer: Self-pay

## 2020-12-05 ENCOUNTER — Other Ambulatory Visit (HOSPITAL_COMMUNITY): Payer: Self-pay

## 2020-12-06 ENCOUNTER — Other Ambulatory Visit (HOSPITAL_COMMUNITY): Payer: Self-pay

## 2020-12-19 ENCOUNTER — Other Ambulatory Visit (HOSPITAL_COMMUNITY): Payer: Self-pay

## 2020-12-19 MED FILL — Pioglitazone HCl Tab 45 MG (Base Equiv): ORAL | 90 days supply | Qty: 90 | Fill #0 | Status: CN

## 2020-12-19 MED FILL — Empagliflozin Tab 10 MG: ORAL | 90 days supply | Qty: 90 | Fill #0 | Status: CN

## 2020-12-28 ENCOUNTER — Other Ambulatory Visit (HOSPITAL_COMMUNITY): Payer: Self-pay

## 2020-12-29 ENCOUNTER — Other Ambulatory Visit (HOSPITAL_COMMUNITY): Payer: Self-pay

## 2020-12-30 ENCOUNTER — Other Ambulatory Visit (HOSPITAL_COMMUNITY): Payer: Self-pay

## 2021-01-05 ENCOUNTER — Other Ambulatory Visit (HOSPITAL_COMMUNITY): Payer: Self-pay

## 2021-01-05 MED FILL — Pioglitazone HCl Tab 45 MG (Base Equiv): ORAL | 90 days supply | Qty: 90 | Fill #0 | Status: AC

## 2021-02-04 ENCOUNTER — Encounter: Payer: Self-pay | Admitting: Family Medicine

## 2021-02-05 ENCOUNTER — Encounter: Payer: Self-pay | Admitting: Endocrinology

## 2021-02-06 ENCOUNTER — Other Ambulatory Visit: Payer: Self-pay | Admitting: Endocrinology

## 2021-02-06 ENCOUNTER — Other Ambulatory Visit (HOSPITAL_COMMUNITY): Payer: Self-pay

## 2021-02-06 MED ORDER — RYBELSUS 7 MG PO TABS
7.0000 mg | ORAL_TABLET | Freq: Every morning | ORAL | 3 refills | Status: DC
  Filled 2021-02-06: qty 90, 90d supply, fill #0

## 2021-02-07 ENCOUNTER — Other Ambulatory Visit: Payer: Self-pay | Admitting: Family Medicine

## 2021-02-07 ENCOUNTER — Other Ambulatory Visit (HOSPITAL_COMMUNITY): Payer: Self-pay

## 2021-02-07 MED ORDER — PANTOPRAZOLE SODIUM 40 MG PO TBEC
DELAYED_RELEASE_TABLET | Freq: Every day | ORAL | 0 refills | Status: DC
  Filled 2021-02-07: qty 90, 90d supply, fill #0

## 2021-02-07 MED FILL — Repaglinide Tab 2 MG: ORAL | 90 days supply | Qty: 540 | Fill #0 | Status: AC

## 2021-02-07 MED FILL — Bromocriptine Mesylate Tab 2.5 MG (Base Equivalent): ORAL | 90 days supply | Qty: 90 | Fill #0 | Status: AC

## 2021-02-07 MED FILL — Glucose Blood Test Strip: 90 days supply | Qty: 100 | Fill #0 | Status: AC

## 2021-02-07 MED FILL — Lisinopril Tab 5 MG: ORAL | 90 days supply | Qty: 90 | Fill #0 | Status: AC

## 2021-02-07 MED FILL — Metformin HCl Tab ER 24HR 500 MG: ORAL | 90 days supply | Qty: 270 | Fill #0 | Status: AC

## 2021-02-07 MED FILL — Atorvastatin Calcium Tab 40 MG (Base Equivalent): ORAL | 90 days supply | Qty: 90 | Fill #0 | Status: AC

## 2021-02-07 MED FILL — Empagliflozin Tab 10 MG: ORAL | 90 days supply | Qty: 90 | Fill #0 | Status: AC

## 2021-02-08 ENCOUNTER — Other Ambulatory Visit (HOSPITAL_COMMUNITY): Payer: Self-pay

## 2021-03-14 ENCOUNTER — Other Ambulatory Visit (HOSPITAL_BASED_OUTPATIENT_CLINIC_OR_DEPARTMENT_OTHER): Payer: Self-pay

## 2021-03-20 ENCOUNTER — Other Ambulatory Visit: Payer: Self-pay

## 2021-03-20 ENCOUNTER — Ambulatory Visit (INDEPENDENT_AMBULATORY_CARE_PROVIDER_SITE_OTHER): Payer: No Typology Code available for payment source | Admitting: Endocrinology

## 2021-03-20 ENCOUNTER — Other Ambulatory Visit (HOSPITAL_COMMUNITY): Payer: Self-pay

## 2021-03-20 VITALS — BP 100/60 | HR 74 | Ht 66.0 in | Wt 128.6 lb

## 2021-03-20 DIAGNOSIS — E119 Type 2 diabetes mellitus without complications: Secondary | ICD-10-CM | POA: Diagnosis not present

## 2021-03-20 LAB — POCT GLYCOSYLATED HEMOGLOBIN (HGB A1C): Hemoglobin A1C: 7.7 % — AB (ref 4.0–5.6)

## 2021-03-20 MED ORDER — REPAGLINIDE 2 MG PO TABS
2.0000 mg | ORAL_TABLET | Freq: Three times a day (TID) | ORAL | 3 refills | Status: DC
  Filled 2021-03-20: qty 270, 90d supply, fill #0

## 2021-03-20 MED ORDER — RYBELSUS 3 MG PO TABS
3.0000 mg | ORAL_TABLET | Freq: Every day | ORAL | 3 refills | Status: DC
  Filled 2021-03-20: qty 30, 30d supply, fill #0
  Filled 2021-04-18: qty 30, 30d supply, fill #1
  Filled 2021-05-08: qty 30, 30d supply, fill #2
  Filled 2021-06-25: qty 30, 30d supply, fill #3
  Filled 2021-08-20: qty 30, 30d supply, fill #4
  Filled 2021-10-01: qty 30, 30d supply, fill #5
  Filled 2021-10-22 – 2021-11-26 (×2): qty 30, 30d supply, fill #6
  Filled 2022-02-12: qty 30, 30d supply, fill #7

## 2021-03-20 NOTE — Patient Instructions (Addendum)
I have sent a prescription to your pharmacy, to reduce the Rybelsus, and:  Please continue the same other diabetes medications.   check your blood sugar once a day.  vary the time of day when you check, between before the 3 meals, and at bedtime.  also check if you have symptoms of your blood sugar being too high or too low.  please keep a record of the readings and bring it to your next appointment here (or you can bring the meter itself).  You can write it on any piece of paper.  please call us sooner if your blood sugar goes below 70, or if you have a lot of readings over 200.   Please come back for a follow-up appointment in 3 months.

## 2021-03-20 NOTE — Progress Notes (Signed)
Subjective:    Patient ID: Elijah Cervantes, male    DOB: 06/01/1956, 65 y.o.   MRN: [id number]  HPI Pt returns for f/u of diabetes mellitus:  DM type: 2 Dx'ed: 1996 Complications: none Therapy: 6 oral meds.   DKA: never Severe hypoglycemia: never.   Pancreatitis: never.   SHOH: he works 3rd shift Other: he has declined acarbose and welchol; he declines insulin; however, wife is Charity fundraiser, so he could take in emergency if necessary; nausea and weight loss limits Rybelsus dosage  Interval history: pt says he does not check cbg's, but he feels as though he has frequent hypoglycemia.  He takes med as rx'ed.  He has nausea.  He takes repaglinide just at HS, due to hypoglycemia.   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   FATTY LIVER DISEASE 07/18/2008   GERD 07/26/2009   HSV-1 infection    HYPERCHOLESTEROLEMIA 10/31/2010   HYPERTENSION 03/31/2007   NASH (nonalcoholic steatohepatitis)    Personal history of colonic adenoma 02/10/2013    Past Surgical History:  Procedure Laterality Date   COLONOSCOPY     2014 1 adenoma 2019 none   hernia repair     Stress Cardiolite  11/22/2003    Social History   Socioeconomic History   Marital status: Married    Spouse name: Not on file   Number of children: Not on file   Years of education: Not on file   Highest education level: Not on file  Occupational History   Occupation: Unemployed  Tobacco Use   Smoking status: Never   Smokeless tobacco: Never  Substance and Sexual Activity   Alcohol use: Yes    Comment: rarely   Drug use: No   Sexual activity: Not on file  Other Topics Concern   Not on file  Social History Narrative   Not on file   Social Determinants of Health   Financial Resource Strain: Not on file  Food Insecurity: Not on file  Transportation Needs: Not on file   Physical Activity: Not on file  Stress: Not on file  Social Connections: Not on file  Intimate Partner Violence: Not on file    Current Outpatient Medications on File Prior to Visit  Medication Sig Dispense Refill   aspirin 81 MG tablet Take 1 tablet (81 mg total) by mouth daily. 30 tablet 0   atorvastatin (LIPITOR) 40 MG tablet TAKE 1 TABLET BY MOUTH ONCE A DAY 90 tablet 3   bromocriptine (PARLODEL) 2.5 MG tablet TAKE 1 TABLET BY MOUTH ONCE A DAY 90 tablet 3   COVID-19 mRNA vaccine, Moderna, 100 MCG/0.5ML injection INJECT AS DIRECTED .5 mL 0   COVID-19 mRNA vaccine, Moderna, 100 MCG/0.5ML injection INJECT AS DIRECTED .5 mL 0   empagliflozin (JARDIANCE) 10 MG TABS tablet TAKE 1 TABLET BY MOUTH ONCE DAILY BEFORE BREAKFAST 90 tablet 3   glucose blood test strip USE TO TEST BLOOD SUGAR ONCE DAILY 100 strip 3   glucose monitoring kit (FREESTYLE) monitoring kit 1 each by Does not apply route as needed for other. 1 each 0   Lancets (FREESTYLE) lancets Use to test blood sugar once daily 100 each 3   lisinopril (ZESTRIL) 5 MG tablet TAKE 1 TABLET BY MOUTH ONCE A DAY 90 tablet 2   metFORMIN (GLUCOPHAGE-XR) 500 MG 24 hr tablet TAKE 3 TABLETS BY  MOUTH DAILY 270 tablet 3   methocarbamol (ROBAXIN) 500 MG tablet TAKE 1 TABLET BY MOUTH AT BEDTIME AS NEEDED FOR MUSCLE SPASMS AND FOR LEG CRAMPS 90 tablet 1   pantoprazole (PROTONIX) 40 MG tablet TAKE 1 TABLET BY MOUTH ONCE A DAY 90 tablet 0   pioglitazone (ACTOS) 45 MG tablet TAKE 1 TABLET BY MOUTH ONCE A DAY 90 tablet 3   triamcinolone (KENALOG) 0.025 % ointment Apply to affected area three times a day as needed for rash 80 g 3   No current facility-administered medications on file prior to visit.    No Known Allergies  Family History  Problem Relation Age of Onset   Diabetes Mother    Diabetes Brother    Cancer Neg Hx    Colon cancer Neg Hx    Esophageal cancer Neg Hx    Stomach cancer Neg Hx    Rectal cancer Neg Hx    Colon polyps Neg Hx      BP 100/60 (BP Location: Right Arm, Patient Position: Sitting, Cuff Size: Normal)   Pulse 74   Ht $R'5\' 6"'vs$  (1.676 m)   Wt 128 lb 9.6 oz (58.3 kg)   SpO2 98%   BMI 20.76 kg/m    Review of Systems     Objective:   Physical Exam Pulses: dorsalis pedis intact bilat.   MSK: no deformity of the feet CV: no leg edema Skin:  no ulcer on the feet.  normal color and temp on the feet. Neuro: sensation is intact to touch on the feet, but decreased from normal  Lab Results  Component Value Date   HGBA1C 7.7 (A) 03/20/2021       Assessment & Plan:  Type 2 DM: Nausea, due to rybelsus.  Patient Instructions  I have sent a prescription to your pharmacy, to reduce the Rybelsus, and:  Please continue the same other diabetes medications.   check your blood sugar once a day.  vary the time of day when you check, between before the 3 meals, and at bedtime.  also check if you have symptoms of your blood sugar being too high or too low.  please keep a record of the readings and bring it to your next appointment here (or you can bring the meter itself).  You can write it on any piece of paper.  please call us sooner if your blood sugar goes below 70, or if you have a lot of readings over 200.   Please come back for a follow-up appointment in 3 months.

## 2021-04-05 ENCOUNTER — Ambulatory Visit: Payer: No Typology Code available for payment source | Attending: Internal Medicine

## 2021-04-05 DIAGNOSIS — Z23 Encounter for immunization: Secondary | ICD-10-CM

## 2021-04-05 NOTE — Progress Notes (Signed)
   Covid-19 Vaccination [facility]  Name:  Elijah Cervantes    MRN: [id number] DOB: 07/16/1956  04/05/2021  Mr. Kashuba was observed post Covid-19 immunization for 15 minutes without incident. He was provided with Vaccine Information Sheet and instruction to access the V-Safe system.   Mr. Etzel was instructed to call 911 with any severe reactions post vaccine: Difficulty breathing  Swelling of face and throat  A fast heartbeat  A bad rash all over body  Dizziness and weakness   Immunizations Administered     Name Date Dose VIS Date Route   Moderna Covid-19 Booster Vaccine 04/05/2021 12:46 PM 0.25 mL 06/21/2020 Intramuscular   Manufacturer: Moderna   Lot: [id number]   [id number]

## 2021-04-09 ENCOUNTER — Other Ambulatory Visit (HOSPITAL_BASED_OUTPATIENT_CLINIC_OR_DEPARTMENT_OTHER): Payer: Self-pay

## 2021-04-09 MED ORDER — COVID-19 MRNA VACC (MODERNA) 100 MCG/0.5ML IM SUSP
INTRAMUSCULAR | 0 refills | Status: DC
  Filled 2021-04-09: qty 0.25, 1d supply, fill #0

## 2021-04-18 ENCOUNTER — Other Ambulatory Visit (HOSPITAL_COMMUNITY): Payer: Self-pay

## 2021-04-18 MED FILL — Pioglitazone HCl Tab 45 MG (Base Equiv): ORAL | 90 days supply | Qty: 90 | Fill #1 | Status: AC

## 2021-05-08 ENCOUNTER — Other Ambulatory Visit (HOSPITAL_COMMUNITY): Payer: Self-pay

## 2021-05-08 ENCOUNTER — Other Ambulatory Visit: Payer: Self-pay | Admitting: Endocrinology

## 2021-05-08 MED ORDER — BROMOCRIPTINE MESYLATE 2.5 MG PO TABS
ORAL_TABLET | Freq: Every day | ORAL | 0 refills | Status: DC
  Filled 2021-05-08: qty 90, 90d supply, fill #0

## 2021-05-08 MED ORDER — EMPAGLIFLOZIN 10 MG PO TABS
ORAL_TABLET | Freq: Every day | ORAL | 0 refills | Status: DC
  Filled 2021-05-08: qty 90, 90d supply, fill #0

## 2021-05-08 MED ORDER — METFORMIN HCL ER 500 MG PO TB24
ORAL_TABLET | Freq: Every day | ORAL | 0 refills | Status: DC
  Filled 2021-05-08: qty 270, 90d supply, fill #0

## 2021-05-08 MED FILL — Atorvastatin Calcium Tab 40 MG (Base Equivalent): ORAL | 90 days supply | Qty: 90 | Fill #1 | Status: AC

## 2021-05-08 MED FILL — Lisinopril Tab 5 MG: ORAL | 90 days supply | Qty: 90 | Fill #1 | Status: AC

## 2021-05-09 ENCOUNTER — Other Ambulatory Visit (HOSPITAL_COMMUNITY): Payer: Self-pay

## 2021-05-11 ENCOUNTER — Other Ambulatory Visit (HOSPITAL_COMMUNITY): Payer: Self-pay

## 2021-05-11 ENCOUNTER — Other Ambulatory Visit: Payer: Self-pay

## 2021-05-11 ENCOUNTER — Ambulatory Visit (INDEPENDENT_AMBULATORY_CARE_PROVIDER_SITE_OTHER): Payer: No Typology Code available for payment source | Admitting: Family Medicine

## 2021-05-11 ENCOUNTER — Encounter: Payer: Self-pay | Admitting: Family Medicine

## 2021-05-11 VITALS — BP 122/66 | HR 65 | Temp 98.3°F | Ht 66.0 in | Wt 136.6 lb

## 2021-05-11 DIAGNOSIS — E7800 Pure hypercholesterolemia, unspecified: Secondary | ICD-10-CM | POA: Diagnosis not present

## 2021-05-11 DIAGNOSIS — I10 Essential (primary) hypertension: Secondary | ICD-10-CM

## 2021-05-11 DIAGNOSIS — Z0000 Encounter for general adult medical examination without abnormal findings: Secondary | ICD-10-CM | POA: Diagnosis not present

## 2021-05-11 NOTE — Progress Notes (Signed)
Established Patient Office Visit  Subjective:  Patient ID: Elijah Cervantes, male    DOB: [birth year]  Age: 65 y.o. MRN: [id number]  CC:  Chief Complaint  Patient presents with   Follow-up    6 month follow up, no concerns.     HPI SHAHIL SPEEGLE presents for follow-up of hypertension and elevated cholesterol.  Blood pressure well controlled with lisinopril and cholesterol is well controlled with atorvastatin.  He is no longer having issues with sleeping or poor appetite.  Has actually gained some weight.  Lives at home with his wife and 2 adult children are both single.  Diabetes is well controlled by endocrinology.  Denies issues with urine flow, frequency or urgency.  There is nocturia 2-3 times at night.  He admits to heavy fluid in consumption prior to bedtime.  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   FATTY LIVER DISEASE 07/18/2008   GERD 07/26/2009   HSV-1 infection    HYPERCHOLESTEROLEMIA 10/31/2010   HYPERTENSION 03/31/2007   NASH (nonalcoholic steatohepatitis)    Personal history of colonic adenoma 02/10/2013    Past Surgical History:  Procedure Laterality Date   COLONOSCOPY     2014 1 adenoma 2019 none   hernia repair     Stress Cardiolite  11/22/2003    Family History  Problem Relation Age of Onset   Diabetes Mother    Diabetes Brother    Cancer Neg Hx    Colon cancer Neg Hx    Esophageal cancer Neg Hx    Stomach cancer Neg Hx    Rectal cancer Neg Hx    Colon polyps Neg Hx     Social History   Socioeconomic History   Marital status: Married    Spouse name: Not on file   Number of children: Not on file   Years of education: Not on file   Highest education level: Not on file  Occupational History   Occupation: Unemployed  Tobacco Use   Smoking status: Never   Smokeless tobacco: Never   Substance and Sexual Activity   Alcohol use: Yes    Comment: rarely   Drug use: No   Sexual activity: Not on file  Other Topics Concern   Not on file  Social History Narrative   Not on file   Social Determinants of Health   Financial Resource Strain: Not on file  Food Insecurity: Not on file  Transportation Needs: Not on file  Physical Activity: Not on file  Stress: Not on file  Social Connections: Not on file  Intimate Partner Violence: Not on file    Outpatient Medications Prior to Visit  Medication Sig Dispense Refill   aspirin 81 MG tablet Take 1 tablet (81 mg total) by mouth daily. 30 tablet 0   atorvastatin (LIPITOR) 40 MG tablet TAKE 1 TABLET BY MOUTH ONCE A DAY 90 tablet 3   bromocriptine (PARLODEL) 2.5 MG tablet TAKE 1 TABLET BY MOUTH ONCE A DAY 90 tablet 0   empagliflozin (JARDIANCE) 10 MG TABS tablet TAKE 1 TABLET BY MOUTH ONCE DAILY BEFORE BREAKFAST 90 tablet 0   glucose blood test strip USE TO TEST BLOOD SUGAR ONCE DAILY 100 strip 3   glucose monitoring kit (FREESTYLE) monitoring kit 1 each by Does not apply route as needed for other. 1 each 0   Lancets (FREESTYLE)  lancets Use to test blood sugar once daily 100 each 3   lisinopril (ZESTRIL) 5 MG tablet TAKE 1 TABLET BY MOUTH ONCE A DAY 90 tablet 2   metFORMIN (GLUCOPHAGE-XR) 500 MG 24 hr tablet TAKE 3 TABLETS BY MOUTH DAILY 270 tablet 0   pioglitazone (ACTOS) 45 MG tablet TAKE 1 TABLET BY MOUTH ONCE A DAY 90 tablet 3   repaglinide (PRANDIN) 2 MG tablet Take 1 tablet (2 mg total) by mouth 3 (three) times daily before meals. 270 tablet 3   Semaglutide (RYBELSUS) 3 MG TABS Take 1 tablet by mouth daily. 90 tablet 3   methocarbamol (ROBAXIN) 500 MG tablet TAKE 1 TABLET BY MOUTH AT BEDTIME AS NEEDED FOR MUSCLE SPASMS AND FOR LEG CRAMPS 90 tablet 1   pantoprazole (PROTONIX) 40 MG tablet TAKE 1 TABLET BY MOUTH ONCE A DAY 90 tablet 0   COVID-19 mRNA vaccine, Moderna, 100 MCG/0.5ML injection INJECT AS DIRECTED (Patient not  taking: Reported on 05/11/2021) .5 mL 0   COVID-19 mRNA vaccine, Moderna, 100 MCG/0.5ML injection INJECT AS DIRECTED (Patient not taking: Reported on 05/11/2021) .5 mL 0   COVID-19 mRNA vaccine, Moderna, 100 MCG/0.5ML injection Inject into the muscle. (Patient not taking: Reported on 05/11/2021) 0.25 mL 0   triamcinolone (KENALOG) 0.025 % ointment Apply to affected area three times a day as needed for rash 80 g 3   No facility-administered medications prior to visit.    No Known Allergies  ROS Review of Systems  Constitutional:  Negative for chills, diaphoresis, fatigue, fever and unexpected weight change.  HENT:  Negative for congestion, postnasal drip and sneezing.   Eyes:  Negative for photophobia and visual disturbance.  Respiratory:  Negative for cough and wheezing.   Cardiovascular: Negative.   Gastrointestinal: Negative.  Negative for abdominal pain, anal bleeding, blood in stool and constipation.  Endocrine: Negative for polyphagia and polyuria.  Genitourinary:  Negative for difficulty urinating, frequency and urgency.  Musculoskeletal:  Negative for gait problem and joint swelling.  Skin:  Negative for pallor and rash.  Neurological:  Negative for weakness and headaches.  Psychiatric/Behavioral:  Negative for dysphoric mood and sleep disturbance. The patient is not nervous/anxious.      Objective:    Physical Exam Vitals and nursing note reviewed.  Constitutional:      General: He is not in acute distress.    Appearance: Normal appearance. He is normal weight. He is not ill-appearing, toxic-appearing or diaphoretic.  HENT:     Head: Normocephalic and atraumatic.     Right Ear: Tympanic membrane, ear canal and external ear normal.     Left Ear: Tympanic membrane, ear canal and external ear normal.     Mouth/Throat:     Mouth: Mucous membranes are moist.     Pharynx: Oropharynx is clear. No oropharyngeal exudate or posterior oropharyngeal erythema.  Eyes:     Extraocular  Movements: Extraocular movements intact.     Conjunctiva/sclera: Conjunctivae normal.     Pupils: Pupils are equal, round, and reactive to light.  Neck:     Vascular: No carotid bruit.  Cardiovascular:     Rate and Rhythm: Normal rate and regular rhythm.  Pulmonary:     Effort: Pulmonary effort is normal.     Breath sounds: Normal breath sounds.  Abdominal:     General: Abdomen is flat. Bowel sounds are normal. There is no distension.     Palpations: Abdomen is soft. There is no mass.     Tenderness: There  is no abdominal tenderness. There is no guarding or rebound.     Hernia: No hernia is present.  Musculoskeletal:     Cervical back: No rigidity or tenderness.  Lymphadenopathy:     Cervical: No cervical adenopathy.  Skin:    General: Skin is warm and dry.  Neurological:     Mental Status: He is alert and oriented to person, place, and time.  Psychiatric:        Mood and Affect: Mood normal.        Behavior: Behavior normal.    BP 122/66 (BP Location: Right Arm, Patient Position: Sitting, Cuff Size: Normal)   Pulse 65   Temp 98.3 F (36.8 C) (Temporal)   Ht $R'5\' 6"'dr$  (1.676 m)   Wt 136 lb 9.6 oz (62 kg)   SpO2 98%   BMI 22.05 kg/m  Wt Readings from Last 3 Encounters:  05/11/21 136 lb 9.6 oz (62 kg)  03/20/21 128 lb 9.6 oz (58.3 kg)  11/08/20 131 lb (59.4 kg)     Health Maintenance Due  Topic Date Due   Zoster Vaccines- Shingrix (1 of 2) Never done   PNA vac Low Risk Adult (1 of 2 - PCV13) 02/14/2020   INFLUENZA VACCINE  04/02/2021    There are no preventive care reminders to display for this patient.  Lab Results  Component Value Date   TSH 1.47 11/25/2019   Lab Results  Component Value Date   WBC 5.8 11/14/2020   HGB 14.3 11/14/2020   HCT 42.8 11/14/2020   MCV 96.5 11/14/2020   PLT 224.0 11/14/2020   Lab Results  Component Value Date   NA 138 11/14/2020   K 4.2 11/14/2020   CO2 30 11/14/2020   GLUCOSE 139 (H) 11/14/2020   BUN 23 11/14/2020    CREATININE 0.95 11/14/2020   BILITOT 0.7 11/14/2020   ALKPHOS 66 11/14/2020   AST 21 11/14/2020   ALT 27 11/14/2020   PROT 7.1 11/14/2020   ALBUMIN 4.5 11/14/2020   CALCIUM 9.9 11/14/2020   GFR 83.85 11/14/2020   Lab Results  Component Value Date   CHOL 103 11/14/2020   Lab Results  Component Value Date   HDL 54.10 11/14/2020   Lab Results  Component Value Date   LDLCALC 38 11/14/2020   Lab Results  Component Value Date   TRIG 53.0 11/14/2020   Lab Results  Component Value Date   CHOLHDL 2 11/14/2020   Lab Results  Component Value Date   HGBA1C 7.7 (A) 03/20/2021      Assessment & Plan:   Problem List Items Addressed This Visit       Cardiovascular and Mediastinum   Essential hypertension   Relevant Orders   Comprehensive metabolic panel   Urinalysis, Routine w reflex microscopic     Other   HYPERCHOLESTEROLEMIA - Primary   Relevant Orders   CBC   Comprehensive metabolic panel   Lipid panel   Healthcare maintenance   Relevant Orders   PSA    No orders of the defined types were placed in this encounter.   Follow-up: Return in about 6 months (around 11/08/2021).  Given information on health maintenance and disease prevention as well as the shingles vaccine.  Advised him to have the shingles vaccine at his pharmacy.  He plans on having the flu vaccine through endocrinology.  Asked him to avoid fluids a couple hours before bedtime.  Libby Maw, MD

## 2021-05-11 NOTE — Addendum Note (Signed)
Addended by: Lynnea Ferrier on: 05/11/2021 03:17 PM   Modules accepted: Orders

## 2021-05-11 NOTE — Addendum Note (Signed)
Addended by: Lynnea Ferrier on: 05/11/2021 03:18 PM   Modules accepted: Orders

## 2021-05-12 ENCOUNTER — Other Ambulatory Visit (HOSPITAL_COMMUNITY): Payer: Self-pay

## 2021-06-20 ENCOUNTER — Ambulatory Visit (INDEPENDENT_AMBULATORY_CARE_PROVIDER_SITE_OTHER): Payer: No Typology Code available for payment source | Admitting: Endocrinology

## 2021-06-20 ENCOUNTER — Telehealth: Payer: Self-pay | Admitting: Nutrition

## 2021-06-20 ENCOUNTER — Other Ambulatory Visit: Payer: Self-pay

## 2021-06-20 VITALS — BP 132/64 | HR 79 | Ht 66.0 in | Wt 135.4 lb

## 2021-06-20 DIAGNOSIS — Z23 Encounter for immunization: Secondary | ICD-10-CM | POA: Diagnosis not present

## 2021-06-20 DIAGNOSIS — E1165 Type 2 diabetes mellitus with hyperglycemia: Secondary | ICD-10-CM | POA: Diagnosis not present

## 2021-06-20 DIAGNOSIS — E119 Type 2 diabetes mellitus without complications: Secondary | ICD-10-CM

## 2021-06-20 LAB — POCT GLYCOSYLATED HEMOGLOBIN (HGB A1C): Hemoglobin A1C: 7.6 % — AB (ref 4.0–5.6)

## 2021-06-20 NOTE — Progress Notes (Signed)
1C

## 2021-06-20 NOTE — Patient Instructions (Addendum)
We are placing a continuous glucose monitor sensor today.   Please continue the same 6 diabetes medications.   check your blood sugar once a day.  vary the time of day when you check, between before the 3 meals, and at bedtime.  also check if you have symptoms of your blood sugar being too high or too low.  please keep a record of the readings and bring it to your next appointment here (or you can bring the meter itself).  You can write it on any piece of paper.  please call us sooner if your blood sugar goes below 70, or if you have a lot of readings over 200.   Please come back for a follow-up appointment in 2 weeks.

## 2021-06-20 NOTE — Telephone Encounter (Signed)
LIbre Pro was inserted into patient's left arm.  It was started at 10:20AM.  It was checked in 2 min. And was working.  They were told to return in two weeks for removal and download.  They had no questions. PRF:[id number], Exp: 10/30/21

## 2021-06-20 NOTE — Progress Notes (Signed)
Subjective:    Patient ID: Elijah Cervantes, male    DOB: 03-28-1956, 65 y.o.   MRN: [id number]  HPI Pt returns for f/u of diabetes mellitus:  DM type: 2 Dx'ed: 9563 Complications: none Therapy: 6 oral meds.   DKA: never Severe hypoglycemia: never.   Pancreatitis: never.   [place]: he works 2nd shift.   Other: he has declined acarbose and welchol; he declines insulin; however, wife is RN, so he could take in emergency if necessary; nausea and weight loss limits Rybelsus dosage.   Interval history: he seldom checks cbg's.  He takes med as rx'ed.  He takes repaglinide with just breakfast and at HS meal.     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   FATTY LIVER DISEASE 07/18/2008   GERD 07/26/2009   HSV-1 infection    HYPERCHOLESTEROLEMIA 10/31/2010   HYPERTENSION 03/31/2007   NASH (nonalcoholic steatohepatitis)    Personal history of colonic adenoma 02/10/2013    Past Surgical History:  Procedure Laterality Date   COLONOSCOPY     2014 1 adenoma 2019 none   hernia repair     Stress Cardiolite  11/22/2003    Social History   Socioeconomic History   Marital status: Married    Spouse name: Not on file   Number of children: Not on file   Years of education: Not on file   Highest education level: Not on file  Occupational History   Occupation: Unemployed  Tobacco Use   Smoking status: Never   Smokeless tobacco: Never  Substance and Sexual Activity   Alcohol use: Yes    Comment: rarely   Drug use: No   Sexual activity: Not on file  Other Topics Concern   Not on file  Social History Narrative   Not on file   Social Determinants of Health   Financial Resource Strain: Not on file  Food Insecurity: Not on file  Transportation Needs: Not on file  Physical Activity: Not on file  Stress: Not on file  Social  Connections: Not on file  Intimate Partner Violence: Not on file    Current Outpatient Medications on File Prior to Visit  Medication Sig Dispense Refill   aspirin 81 MG tablet Take 1 tablet (81 mg total) by mouth daily. 30 tablet 0   atorvastatin (LIPITOR) 40 MG tablet TAKE 1 TABLET BY MOUTH ONCE A DAY 90 tablet 3   bromocriptine (PARLODEL) 2.5 MG tablet TAKE 1 TABLET BY MOUTH ONCE A DAY 90 tablet 0   empagliflozin (JARDIANCE) 10 MG TABS tablet TAKE 1 TABLET BY MOUTH ONCE DAILY BEFORE BREAKFAST 90 tablet 0   glucose blood test strip USE TO TEST BLOOD SUGAR ONCE DAILY 100 strip 3   glucose monitoring kit (FREESTYLE) monitoring kit 1 each by Does not apply route as needed for other. 1 each 0   Lancets (FREESTYLE) lancets Use to test blood sugar once daily 100 each 3   lisinopril (ZESTRIL) 5 MG tablet TAKE 1 TABLET BY MOUTH ONCE A DAY 90 tablet 2   metFORMIN (GLUCOPHAGE-XR) 500 MG 24 hr tablet TAKE 3 TABLETS BY MOUTH DAILY 270 tablet 0   pioglitazone (ACTOS) 45 MG tablet TAKE 1 TABLET BY MOUTH ONCE A DAY 90 tablet 3   repaglinide (PRANDIN) 2 MG tablet Take 1 tablet (2 mg total) by mouth 3 (three)  times daily before meals. 270 tablet 3   Semaglutide (RYBELSUS) 3 MG TABS Take 1 tablet by mouth daily. 90 tablet 3   No current facility-administered medications on file prior to visit.    No Known Allergies  Family History  Problem Relation Age of Onset   Diabetes Mother    Diabetes Brother    Cancer Neg Hx    Colon cancer Neg Hx    Esophageal cancer Neg Hx    Stomach cancer Neg Hx    Rectal cancer Neg Hx    Colon polyps Neg Hx     BP 132/64 (BP Location: Left Arm, Patient Position: Sitting, Cuff Size: Normal)   Pulse 79   Ht 5' 6" (1.676 m)   Wt 135 lb 6.4 oz (61.4 kg)   SpO2 98%   BMI 21.85 kg/m    Review of Systems Denies n/v/HB.  He denies hypoglycemia.      Objective:   Physical Exam Pulses: dorsalis pedis intact bilat.   MSK: no deformity of the feet CV: no leg  edema.   Skin:  no ulcer on the feet.  normal color and temp on the feet.   Neuro: sensation is intact to touch on the feet.    Lab Results  Component Value Date   HGBA1C 7.6 (A) 06/20/2021       Assessment & Plan:  Type 2 DM: uncontrolled.  I need more glucose info in order to safely adjust meds.    Patient Instructions  We are placing a continuous glucose monitor sensor today.   Please continue the same 6 diabetes medications.   check your blood sugar once a day.  vary the time of day when you check, between before the 3 meals, and at bedtime.  also check if you have symptoms of your blood sugar being too high or too low.  please keep a record of the readings and bring it to your next appointment here (or you can bring the meter itself).  You can write it on any piece of paper.  please call us sooner if your blood sugar goes below 70, or if you have a lot of readings over 200.   Please come back for a follow-up appointment in 2 weeks.     

## 2021-06-25 ENCOUNTER — Other Ambulatory Visit (HOSPITAL_COMMUNITY): Payer: Self-pay

## 2021-07-04 ENCOUNTER — Other Ambulatory Visit (HOSPITAL_COMMUNITY): Payer: Self-pay

## 2021-07-04 ENCOUNTER — Encounter: Payer: Self-pay | Admitting: Endocrinology

## 2021-07-04 ENCOUNTER — Ambulatory Visit (INDEPENDENT_AMBULATORY_CARE_PROVIDER_SITE_OTHER): Payer: No Typology Code available for payment source | Admitting: Endocrinology

## 2021-07-04 ENCOUNTER — Other Ambulatory Visit: Payer: Self-pay

## 2021-07-04 VITALS — BP 110/60 | HR 67 | Ht 66.0 in | Wt 136.4 lb

## 2021-07-04 DIAGNOSIS — E119 Type 2 diabetes mellitus without complications: Secondary | ICD-10-CM

## 2021-07-04 LAB — POCT GLYCOSYLATED HEMOGLOBIN (HGB A1C): Hemoglobin A1C: 7.6 % — AB (ref 4.0–5.6)

## 2021-07-04 MED ORDER — REPAGLINIDE 2 MG PO TABS
ORAL_TABLET | ORAL | 3 refills | Status: DC
  Filled 2021-07-04: qty 270, 90d supply, fill #0
  Filled 2021-10-22: qty 270, 90d supply, fill #1

## 2021-07-04 NOTE — Progress Notes (Signed)
Subjective:    Patient ID: Elijah Cervantes, male    DOB: [birth year], 65 y.o.   MRN: [id number]  HPI Pt returns for f/u of diabetes mellitus:  DM type: 2 Dx'ed: 1996 Complications: none Therapy: 6 oral meds.   DKA: never Severe hypoglycemia: never.   Pancreatitis: never.   [employer]: he works 2nd shift.   Other: he has declined acarbose and welchol; he declines insulin; however, wife is RN, so he could take in emergency if necessary; nausea and weight loss limits Rybelsus dosage.   Interval history: I reviewed continuous glucose monitor data.  It is in general highest at 12N, and lowest at 8AM.  It decreases 2AM-8AM, and then increases to 11AM.  He takes med as rx'ed.  He takes repaglinide with just breakfast and at HS meal.  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   FATTY LIVER DISEASE 07/18/2008   GERD 07/26/2009   HSV-1 infection    HYPERCHOLESTEROLEMIA 10/31/2010   HYPERTENSION 03/31/2007   NASH (nonalcoholic steatohepatitis)    Personal history of colonic adenoma 02/10/2013    Past Surgical History:  Procedure Laterality Date   COLONOSCOPY     2014 1 adenoma 2019 none   hernia repair     Stress Cardiolite  11/22/2003    Social History   Socioeconomic History   Marital status: Married    Spouse name: Not on file   Number of children: Not on file   Years of education: Not on file   Highest education level: Not on file  Occupational History   Occupation: Unemployed  Tobacco Use   Smoking status: Never   Smokeless tobacco: Never  Substance and Sexual Activity   Alcohol use: Yes    Comment: rarely   Drug use: No   Sexual activity: Not on file  Other Topics Concern   Not on file  Social History Narrative   Not on file   Social Determinants of Health   Financial Resource Strain: Not on file  Food Insecurity:  Not on file  Transportation Needs: Not on file  Physical Activity: Not on file  Stress: Not on file  Social Connections: Not on file  Intimate Partner Violence: Not on file    Current Outpatient Medications on File Prior to Visit  Medication Sig Dispense Refill   aspirin 81 MG tablet Take 1 tablet (81 mg total) by mouth daily. 30 tablet 0   atorvastatin (LIPITOR) 40 MG tablet TAKE 1 TABLET BY MOUTH ONCE A DAY 90 tablet 3   bromocriptine (PARLODEL) 2.5 MG tablet TAKE 1 TABLET BY MOUTH ONCE A DAY 90 tablet 0   empagliflozin (JARDIANCE) 10 MG TABS tablet TAKE 1 TABLET BY MOUTH ONCE DAILY BEFORE BREAKFAST 90 tablet 0   glucose blood test strip USE TO TEST BLOOD SUGAR ONCE DAILY 100 strip 3   glucose monitoring kit (FREESTYLE) monitoring kit 1 each by Does not apply route as needed for other. 1 each 0   Lancets (FREESTYLE) lancets Use to test blood sugar once daily 100 each 3   lisinopril (ZESTRIL) 5 MG tablet TAKE 1 TABLET BY MOUTH ONCE A DAY 90 tablet 2   metFORMIN (GLUCOPHAGE-XR) 500 MG 24 hr tablet TAKE 3 TABLETS BY MOUTH DAILY 270 tablet 0   pioglitazone (ACTOS) 45 MG tablet TAKE 1 TABLET BY MOUTH ONCE A DAY  90 tablet 3   Semaglutide (RYBELSUS) 3 MG TABS Take 1 tablet by mouth daily. 90 tablet 3   No current facility-administered medications on file prior to visit.    No Known Allergies  Family History  Problem Relation Age of Onset   Diabetes Mother    Diabetes Brother    Cancer Neg Hx    Colon cancer Neg Hx    Esophageal cancer Neg Hx    Stomach cancer Neg Hx    Rectal cancer Neg Hx    Colon polyps Neg Hx     BP 110/60 (BP Location: Right Arm, Patient Position: Sitting, Cuff Size: Normal)   Pulse 67   Ht $R'5\' 6"'Wb$  (1.676 m)   Wt 136 lb 6.4 oz (61.9 kg)   SpO2 99%   BMI 22.02 kg/m    Review of Systems     Objective:   Physical Exam    Lab Results  Component Value Date   HGBA1C 7.6 (A) 07/04/2021      Assessment & Plan:  Type 2 DM: uncontrolled.   Patient  Instructions  Please change the repaglinide to 2 tabs with breakfast, none with lunch, and 1 tab with supper.  Please continue the same other 5 diabetes medications.   check your blood sugar once a day.  vary the time of day when you check, between before the 3 meals, and at bedtime.  also check if you have symptoms of your blood sugar being too high or too low.  please keep a record of the readings and bring it to your next appointment here (or you can bring the meter itself).  You can write it on any piece of paper.  please call us sooner if your blood sugar goes below 70, or if you have a lot of readings over 200.   Please come back for a follow-up appointment in 3 months.

## 2021-07-04 NOTE — Patient Instructions (Addendum)
Please change the repaglinide to 2 tabs with breakfast, none with lunch, and 1 tab with supper.  Please continue the same other 5 diabetes medications.   check your blood sugar once a day.  vary the time of day when you check, between before the 3 meals, and at bedtime.  also check if you have symptoms of your blood sugar being too high or too low.  please keep a record of the readings and bring it to your next appointment here (or you can bring the meter itself).  You can write it on any piece of paper.  please call us sooner if your blood sugar goes below 70, or if you have a lot of readings over 200.   Please come back for a follow-up appointment in 3 months.

## 2021-07-05 ENCOUNTER — Other Ambulatory Visit (HOSPITAL_COMMUNITY): Payer: Self-pay

## 2021-07-13 ENCOUNTER — Other Ambulatory Visit (HOSPITAL_COMMUNITY): Payer: Self-pay

## 2021-07-13 MED FILL — Pioglitazone HCl Tab 45 MG (Base Equiv): ORAL | 90 days supply | Qty: 90 | Fill #2 | Status: AC

## 2021-08-03 ENCOUNTER — Other Ambulatory Visit: Payer: Self-pay | Admitting: Endocrinology

## 2021-08-03 ENCOUNTER — Other Ambulatory Visit (HOSPITAL_COMMUNITY): Payer: Self-pay

## 2021-08-03 ENCOUNTER — Other Ambulatory Visit: Payer: Self-pay | Admitting: Family Medicine

## 2021-08-03 DIAGNOSIS — I10 Essential (primary) hypertension: Secondary | ICD-10-CM

## 2021-08-03 MED ORDER — LISINOPRIL 5 MG PO TABS
ORAL_TABLET | Freq: Every day | ORAL | 2 refills | Status: DC
  Filled 2021-08-03: qty 90, 90d supply, fill #0
  Filled 2021-10-22: qty 90, 90d supply, fill #1
  Filled 2022-02-12: qty 90, 90d supply, fill #2

## 2021-08-03 MED ORDER — BROMOCRIPTINE MESYLATE 2.5 MG PO TABS
ORAL_TABLET | Freq: Every day | ORAL | 0 refills | Status: DC
  Filled 2021-08-03: qty 90, 90d supply, fill #0

## 2021-08-03 MED ORDER — EMPAGLIFLOZIN 10 MG PO TABS
ORAL_TABLET | Freq: Every day | ORAL | 0 refills | Status: DC
  Filled 2021-08-03: qty 90, 90d supply, fill #0

## 2021-08-03 MED FILL — Atorvastatin Calcium Tab 40 MG (Base Equivalent): ORAL | 90 days supply | Qty: 90 | Fill #2 | Status: AC

## 2021-08-15 NOTE — Telephone Encounter (Signed)
Left msg for pt to schedule appt.

## 2021-08-20 ENCOUNTER — Other Ambulatory Visit (HOSPITAL_COMMUNITY): Payer: Self-pay

## 2021-08-20 ENCOUNTER — Other Ambulatory Visit: Payer: Self-pay | Admitting: Endocrinology

## 2021-08-20 MED ORDER — METFORMIN HCL ER 500 MG PO TB24
ORAL_TABLET | Freq: Every day | ORAL | 0 refills | Status: DC
  Filled 2021-08-20: qty 270, 90d supply, fill #0

## 2021-09-05 ENCOUNTER — Other Ambulatory Visit (HOSPITAL_COMMUNITY): Payer: Self-pay

## 2021-10-01 ENCOUNTER — Other Ambulatory Visit (HOSPITAL_COMMUNITY): Payer: Self-pay

## 2021-10-01 MED ORDER — AMOXICILLIN 250 MG PO CAPS
ORAL_CAPSULE | ORAL | 0 refills | Status: DC
  Filled 2021-10-01: qty 28, 7d supply, fill #0

## 2021-10-04 ENCOUNTER — Ambulatory Visit (INDEPENDENT_AMBULATORY_CARE_PROVIDER_SITE_OTHER): Payer: PPO | Admitting: Endocrinology

## 2021-10-04 ENCOUNTER — Other Ambulatory Visit: Payer: Self-pay

## 2021-10-04 ENCOUNTER — Other Ambulatory Visit (HOSPITAL_COMMUNITY): Payer: Self-pay

## 2021-10-04 ENCOUNTER — Encounter: Payer: Self-pay | Admitting: Endocrinology

## 2021-10-04 VITALS — BP 128/70 | HR 68 | Ht 66.0 in | Wt 136.8 lb

## 2021-10-04 DIAGNOSIS — E119 Type 2 diabetes mellitus without complications: Secondary | ICD-10-CM | POA: Diagnosis not present

## 2021-10-04 LAB — POCT GLYCOSYLATED HEMOGLOBIN (HGB A1C): Hemoglobin A1C: 7.7 % — AB (ref 4.0–5.6)

## 2021-10-04 MED ORDER — EMPAGLIFLOZIN 25 MG PO TABS
25.0000 mg | ORAL_TABLET | Freq: Every day | ORAL | 3 refills | Status: DC
  Filled 2021-10-04: qty 90, 90d supply, fill #0
  Filled 2022-02-15: qty 90, 90d supply, fill #1
  Filled 2022-03-11: qty 30, 30d supply, fill #1
  Filled 2022-04-08: qty 30, 30d supply, fill #2
  Filled 2022-05-07: qty 30, 30d supply, fill #3

## 2021-10-04 NOTE — Progress Notes (Signed)
Subjective:    Patient ID: Elijah Cervantes, male    DOB: 1956/08/04, 66 y.o.   MRN: [id number]  HPI Pt returns for f/u of diabetes mellitus:  DM type: 2 Dx'ed: 1996 Complications: none.   Therapy: 6 oral meds.   DKA: never Severe hypoglycemia: never.   Pancreatitis: never.   [employer]OH: he works 2nd shift.   Other: he has declined acarbose and welchol; he declines insulin; however, wife is RN, so he could take in emergency if necessary; nausea and weight loss limit Rybelsus dosage.   Interval history: He takes med as rx'ed.  Pt says cbg was recently 87, but he seldom checks.   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   FATTY LIVER DISEASE 07/18/2008   GERD 07/26/2009   HSV-1 infection    HYPERCHOLESTEROLEMIA 10/31/2010   HYPERTENSION 03/31/2007   NASH (nonalcoholic steatohepatitis)    Personal history of colonic adenoma 02/10/2013    Past Surgical History:  Procedure Laterality Date   COLONOSCOPY     2014 1 adenoma 2019 none   hernia repair     Stress Cardiolite  11/22/2003    Social History   Socioeconomic History   Marital status: Married    Spouse name: Not on file   Number of children: Not on file   Years of education: Not on file   Highest education level: Not on file  Occupational History   Occupation: Unemployed  Tobacco Use   Smoking status: Never   Smokeless tobacco: Never  Substance and Sexual Activity   Alcohol use: Yes    Comment: rarely   Drug use: No   Sexual activity: Not on file  Other Topics Concern   Not on file  Social History Narrative   Not on file   Social Determinants of Health   Financial Resource Strain: Not on file  Food Insecurity: Not on file  Transportation Needs: Not on file  Physical Activity: Not on file  Stress: Not on file  Social Connections: Not on file  Intimate  Partner Violence: Not on file    Current Outpatient Medications on File Prior to Visit  Medication Sig Dispense Refill   amoxicillin (AMOXIL) 250 MG capsule Take 1 capsule by mouth 4 times daily. 28 capsule 0   aspirin 81 MG tablet Take 1 tablet (81 mg total) by mouth daily. 30 tablet 0   atorvastatin (LIPITOR) 40 MG tablet TAKE 1 TABLET BY MOUTH ONCE A DAY 90 tablet 3   bromocriptine (PARLODEL) 2.5 MG tablet TAKE 1 TABLET BY MOUTH ONCE A DAY 90 tablet 0   glucose monitoring kit (FREESTYLE) monitoring kit 1 each by Does not apply route as needed for other. 1 each 0   Lancets (FREESTYLE) lancets Use to test blood sugar once daily 100 each 3   lisinopril (ZESTRIL) 5 MG tablet TAKE 1 TABLET BY MOUTH ONCE A DAY 90 tablet 2   metFORMIN (GLUCOPHAGE-XR) 500 MG 24 hr tablet TAKE 3 TABLETS BY MOUTH DAILY 270 tablet 0   pioglitazone (ACTOS) 45 MG tablet TAKE 1 TABLET BY MOUTH ONCE A DAY 90 tablet 3   repaglinide (PRANDIN) 2 MG tablet Take 2 tablets with breakfast & take 1 tablet with supper. 270 tablet 3   Semaglutide (RYBELSUS) 3 MG TABS Take 1 tablet by mouth daily. 90 tablet 3   No  current facility-administered medications on file prior to visit.    No Known Allergies  Family History  Problem Relation Age of Onset   Diabetes Mother    Diabetes Brother    Cancer Neg Hx    Colon cancer Neg Hx    Esophageal cancer Neg Hx    Stomach cancer Neg Hx    Rectal cancer Neg Hx    Colon polyps Neg Hx     BP 128/70 (BP Location: Left Arm, Patient Position: Sitting, Cuff Size: Normal)    Pulse 68    Ht $R'5\' 6"'aT$  (1.676 m)    Wt 136 lb 12.8 oz (62.1 kg)    SpO2 97%    BMI 22.08 kg/m    Review of Systems He denies hypoglycemia/N/V/HB/bloating.      Objective:   Physical Exam    Lab Results  Component Value Date   HGBA1C 7.7 (A) 10/04/2021      Assessment & Plan:  Type 2 DM: uncontrolled.   Patient Instructions  I have sent a prescription to your pharmacy, to increase the Jardiance.    Please continue the same other 5 diabetes medications.   check your blood sugar once a day.  vary the time of day when you check, between before the 3 meals, and at bedtime.  also check if you have symptoms of your blood sugar being too high or too low.  please keep a record of the readings and bring it to your next appointment here (or you can bring the meter itself).  You can write it on any piece of paper.  please call us sooner if your blood sugar goes below 70, or if you have a lot of readings over 200.   Please come back for a follow-up appointment in late April.

## 2021-10-04 NOTE — Patient Instructions (Addendum)
I have sent a prescription to your pharmacy, to increase the Jardiance.   Please continue the same other 5 diabetes medications.   check your blood sugar once a day.  vary the time of day when you check, between before the 3 meals, and at bedtime.  also check if you have symptoms of your blood sugar being too high or too low.  please keep a record of the readings and bring it to your next appointment here (or you can bring the meter itself).  You can write it on any piece of paper.  please call us sooner if your blood sugar goes below 70, or if you have a lot of readings over 200.   Please come back for a follow-up appointment in late April.

## 2021-10-16 ENCOUNTER — Other Ambulatory Visit (HOSPITAL_COMMUNITY): Payer: Self-pay

## 2021-10-16 ENCOUNTER — Telehealth: Payer: Self-pay | Admitting: Pharmacy Technician

## 2021-10-16 ENCOUNTER — Encounter: Payer: Self-pay | Admitting: Endocrinology

## 2021-10-16 NOTE — Telephone Encounter (Signed)
Patient Advocate Encounter  Received notification from PATIENT ([employer]) that prior authorization for RYBELSUS 3MG  is required.   PA submitted on 2.14.23 Key [id number] Status is pending   [facility] will continue to follow  Luciano Cutter, CPhT Patient Advocate [facility] Phone: [phone] Fax:  [phone]

## 2021-10-16 NOTE — Telephone Encounter (Signed)
PA has been submitted.

## 2021-10-22 ENCOUNTER — Other Ambulatory Visit: Payer: Self-pay | Admitting: Endocrinology

## 2021-10-22 ENCOUNTER — Other Ambulatory Visit (HOSPITAL_COMMUNITY): Payer: Self-pay

## 2021-10-22 MED ORDER — BROMOCRIPTINE MESYLATE 2.5 MG PO TABS
ORAL_TABLET | Freq: Every day | ORAL | 0 refills | Status: DC
Start: 2021-10-22 — End: 2021-12-28
  Filled 2021-10-22: qty 90, 90d supply, fill #0
  Filled 2021-11-28: qty 30, 30d supply, fill #0

## 2021-10-22 MED ORDER — PIOGLITAZONE HCL 45 MG PO TABS
ORAL_TABLET | Freq: Every day | ORAL | 3 refills | Status: DC
  Filled 2021-10-22: qty 90, 90d supply, fill #0
  Filled 2022-02-12: qty 90, 90d supply, fill #1
  Filled 2022-05-07: qty 90, 90d supply, fill #2
  Filled 2022-07-15: qty 90, 90d supply, fill #3

## 2021-10-23 ENCOUNTER — Other Ambulatory Visit (HOSPITAL_COMMUNITY): Payer: Self-pay

## 2021-11-09 ENCOUNTER — Ambulatory Visit (INDEPENDENT_AMBULATORY_CARE_PROVIDER_SITE_OTHER): Payer: PPO | Admitting: Family Medicine

## 2021-11-09 ENCOUNTER — Other Ambulatory Visit: Payer: Self-pay

## 2021-11-09 ENCOUNTER — Encounter: Payer: Self-pay | Admitting: Family Medicine

## 2021-11-09 VITALS — BP 110/72 | HR 75 | Temp 97.9°F | Ht 66.0 in | Wt 139.8 lb

## 2021-11-09 DIAGNOSIS — E7800 Pure hypercholesterolemia, unspecified: Secondary | ICD-10-CM

## 2021-11-09 DIAGNOSIS — I10 Essential (primary) hypertension: Secondary | ICD-10-CM

## 2021-11-09 LAB — COMPREHENSIVE METABOLIC PANEL
ALT: 30 U/L (ref 0–53)
AST: 25 U/L (ref 0–37)
Albumin: 4.2 g/dL (ref 3.5–5.2)
Alkaline Phosphatase: 64 U/L (ref 39–117)
BUN: 25 mg/dL — ABNORMAL HIGH (ref 6–23)
CO2: 28 mEq/L (ref 19–32)
Calcium: 9.2 mg/dL (ref 8.4–10.5)
Chloride: 102 mEq/L (ref 96–112)
Creatinine, Ser: 0.97 mg/dL (ref 0.40–1.50)
GFR: 81.22 mL/min (ref >60.00)
Glucose, Bld: 243 mg/dL — ABNORMAL HIGH (ref 70–99)
Potassium: 3.6 mEq/L (ref 3.5–5.1)
Sodium: 136 mEq/L (ref 135–145)
Total Bilirubin: 0.6 mg/dL (ref 0.2–1.2)
Total Protein: 6.5 g/dL (ref 6.0–8.3)

## 2021-11-09 LAB — CBC WITH DIFFERENTIAL/PLATELET
Basophils Absolute: 0 10*3/uL (ref 0.0–0.1)
Basophils Relative: 0.9 % (ref 0.0–3.0)
Eosinophils Absolute: 0.1 10*3/uL (ref 0.0–0.7)
Eosinophils Relative: 3.3 % (ref 0.0–5.0)
HCT: 43.2 % (ref 39.0–52.0)
Hemoglobin: 13.9 g/dL (ref 13.0–17.0)
Lymphocytes Relative: 22.8 % (ref 12.0–46.0)
Lymphs Abs: 0.9 10*3/uL (ref 0.7–4.0)
MCHC: 32.2 g/dL (ref 30.0–36.0)
MCV: 98.5 fl (ref 78.0–100.0)
Monocytes Absolute: 0.3 10*3/uL (ref 0.1–1.0)
Monocytes Relative: 7.6 % (ref 3.0–12.0)
Neutro Abs: 2.6 10*3/uL (ref 1.4–7.7)
Neutrophils Relative %: 65.4 % (ref 43.0–77.0)
Platelets: 185 10*3/uL (ref 150.0–400.0)
RBC: 4.39 Mil/uL (ref 4.22–5.81)
RDW: 12.8 % (ref 11.5–15.5)
WBC: 3.9 10*3/uL — ABNORMAL LOW (ref 4.0–10.5)

## 2021-11-09 LAB — URINALYSIS
Bilirubin Urine: NEGATIVE
Hgb urine dipstick: NEGATIVE
Ketones, ur: NEGATIVE
Leukocytes,Ua: NEGATIVE
Nitrite: NEGATIVE
Specific Gravity, Urine: <=1.005 — AB (ref 1.000–1.030)
Total Protein, Urine: NEGATIVE
Urine Glucose: >=1000 — AB
Urobilinogen, UA: 0.2 (ref 0.0–1.0)
pH: 5.5 (ref 5.0–8.0)

## 2021-11-09 LAB — LIPID PANEL
Cholesterol: 126 mg/dL (ref 0–200)
HDL: 62.6 mg/dL (ref >39.00)
LDL Cholesterol: 51 mg/dL (ref 0–99)
NonHDL: 62.98
Total CHOL/HDL Ratio: 2
Triglycerides: 62 mg/dL (ref 0.0–149.0)
VLDL: 12.4 mg/dL (ref 0.0–40.0)

## 2021-11-09 NOTE — Progress Notes (Signed)
? ?Established Patient Office Visit ? ?Subjective:  ?Patient ID: Elijah Cervantes, male    DOB: May 08, 1956  Age: 66 y.o. MRN: [id number] ? ?CC:  ?Chief Complaint  ?Patient presents with  ? Annual Exam  ?  CPE- pt is not fasting   ? ? ?HPI ?Elijah Cervantes presents for 37-month follow-up of elevated cholesterol, Zestril for renal protection and blood pressure control and health maintenance.  Health maintenance is up-to-date.  Blood pressure well controlled on low-dose Zestril.  LDL cholesterol controlled with atorvastatin diabetes control through endocrinology.  Continues to work third shift ? ?Past Medical History:  ?Diagnosis Date  ? ABNORMAL ELECTROCARDIOGRAM 07/26/2009  ? DEPRESSION 03/31/2007  ? DIABETES MELLITUS, TYPE II 03/31/2007  ? Dyspepsia   ? 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 FATTY LIVER DISEASE 07/18/2008  ? GERD 07/26/2009  ? HSV-1 infection   ? HYPERCHOLESTEROLEMIA 10/31/2010  ? HYPERTENSION 03/31/2007  ? NASH (nonalcoholic steatohepatitis)   ? Personal history of colonic adenoma 02/10/2013  ? ? ?Past Surgical History:  ?Procedure Laterality Date  ? COLONOSCOPY    ? 2014 1 adenoma 2019 none  ? hernia repair    ? Stress Cardiolite  11/22/2003  ? ? ?Family History  ?Problem Relation Age of Onset  ? Diabetes Mother   ? Diabetes Brother   ? Cancer Neg Hx   ? Colon cancer Neg Hx   ? Esophageal cancer Neg Hx   ? Stomach cancer Neg Hx   ? Rectal cancer Neg Hx   ? Colon polyps Neg Hx   ? ? ?Social History  ? ?Socioeconomic History  ? Marital status: Married  ?  Spouse name: Not on file  ? Number of children: Not on file  ? Years of education: Not on file  ? Highest education level: Not on file  ?Occupational History  ? Occupation: Unemployed  ?Tobacco Use  ? Smoking status: Never  ? Smokeless tobacco: Never  ?Substance and Sexual Activity  ? Alcohol use: Yes  ?  Comment: rarely  ? Drug use: No  ? Sexual activity: Not on file  ?Other Topics Concern  ? Not on file  ?Social History Narrative  ? Not on file   ? ?Social Determinants of Health  ? ?Financial Resource Strain: Not on file  ?Food Insecurity: Not on file  ?Transportation Needs: Not on file  ?Physical Activity: Not on file  ?Stress: Not on file  ?Social Connections: Not on file  ?Intimate Partner Violence: Not on file  ? ? ?Outpatient Medications Prior to Visit  ?Medication Sig Dispense Refill  ? aspirin 81 MG tablet Take 1 tablet (81 mg total) by mouth daily. 30 tablet 0  ? bromocriptine (PARLODEL) 2.5 MG tablet TAKE 1 TABLET BY MOUTH ONCE A DAY 90 tablet 0  ? empagliflozin (JARDIANCE) 25 MG TABS tablet Take 1 tablet (25 mg total) by mouth daily. 90 tablet 3  ? glucose monitoring kit (FREESTYLE) monitoring kit 1 each by Does not apply route as needed for other. 1 each 0  ? Lancets (FREESTYLE) lancets Use to test blood sugar once daily 100 each 3  ? lisinopril (ZESTRIL) 5 MG tablet TAKE 1 TABLET BY MOUTH ONCE A DAY 90 tablet 2  ? metFORMIN (GLUCOPHAGE-XR) 500 MG 24 hr tablet TAKE 3 TABLETS BY MOUTH DAILY 270 tablet 0  ? pioglitazone (ACTOS) 45 MG tablet TAKE 1 TABLET BY MOUTH ONCE A DAY 90 tablet 3  ? repaglinide (PRANDIN) 2 MG tablet Take 2 tablets with  breakfast & take 1 tablet with supper. 270 tablet 3  ? Semaglutide (RYBELSUS) 3 MG TABS Take 1 tablet by mouth daily. 90 tablet 3  ? amoxicillin (AMOXIL) 250 MG capsule Take 1 capsule by mouth 4 times daily. 28 capsule 0  ? atorvastatin (LIPITOR) 40 MG tablet TAKE 1 TABLET BY MOUTH ONCE A DAY 90 tablet 3  ? ?No facility-administered medications prior to visit.  ? ? ?No Known Allergies ? ?ROS ?Review of Systems  ?Constitutional:  Negative for chills, diaphoresis, fatigue, fever and unexpected weight change.  ?HENT: Negative.    ?Eyes:  Negative for photophobia and visual disturbance.  ?Respiratory: Negative.    ?Cardiovascular: Negative.   ?Gastrointestinal: Negative.   ?Endocrine: Negative for polyphagia and polyuria.  ?Genitourinary: Negative.   ?Neurological: Negative.   ?Psychiatric/Behavioral: Negative.     ? ?  ?Objective:  ?  ?Physical Exam ?Vitals and nursing note reviewed.  ?Constitutional:   ?   General: He is not in acute distress. ?   Appearance: Normal appearance. He is not ill-appearing, toxic-appearing or diaphoretic.  ?HENT:  ?   Head: Normocephalic and atraumatic.  ?   Right Ear: External ear normal.  ?   Left Ear: External ear normal.  ?   Mouth/Throat:  ?   Mouth: Mucous membranes are moist.  ?   Pharynx: Oropharynx is clear. No oropharyngeal exudate or posterior oropharyngeal erythema.  ?Eyes:  ?   Extraocular Movements: Extraocular movements intact.  ?   Conjunctiva/sclera: Conjunctivae normal.  ?   Pupils: Pupils are equal, round, and reactive to light.  ?Neck:  ?   Vascular: No carotid bruit.  ?Cardiovascular:  ?   Rate and Rhythm: Normal rate and regular rhythm.  ?Pulmonary:  ?   Effort: Pulmonary effort is normal.  ?   Breath sounds: Normal breath sounds.  ?Abdominal:  ?   General: Abdomen is flat. Bowel sounds are normal. There is no distension.  ?   Palpations: There is no mass.  ?   Tenderness: There is no abdominal tenderness. There is no guarding or rebound.  ?   Hernia: No hernia is present.  ?Musculoskeletal:  ?   Cervical back: No rigidity or tenderness.  ?   Right lower leg: No edema.  ?   Left lower leg: No edema.  ?Lymphadenopathy:  ?   Cervical: No cervical adenopathy.  ?Skin: ?   General: Skin is warm and dry.  ?Neurological:  ?   Mental Status: He is alert and oriented to person, place, and time.  ?Psychiatric:     ?   Mood and Affect: Mood normal.     ?   Behavior: Behavior normal.  ? ? ?BP 110/72 (BP Location: Left Arm, Patient Position: Sitting, Cuff Size: Normal)   Pulse 75   Temp 97.9 ?F (36.6 ?C) (Temporal)   Ht $R'5\' 6"'yE$  (1.676 m)   Wt 139 lb 12.8 oz (63.4 kg)   SpO2 99%   BMI 22.56 kg/m?  ?Wt Readings from Last 3 Encounters:  ?11/09/21 139 lb 12.8 oz (63.4 kg)  ?10/04/21 136 lb 12.8 oz (62.1 kg)  ?07/04/21 136 lb 6.4 oz (61.9 kg)  ? ? ? ?There are no preventive care  reminders to display for this patient. ? ?There are no preventive care reminders to display for this patient. ? ?Lab Results  ?Component Value Date  ? TSH 1.47 11/25/2019  ? ?Lab Results  ?Component Value Date  ? WBC 5.8 11/14/2020  ? HGB  14.3 11/14/2020  ? HCT 42.8 11/14/2020  ? MCV 96.5 11/14/2020  ? PLT 224.0 11/14/2020  ? ?Lab Results  ?Component Value Date  ? NA 138 11/14/2020  ? K 4.2 11/14/2020  ? CO2 30 11/14/2020  ? GLUCOSE 139 (H) 11/14/2020  ? BUN 23 11/14/2020  ? CREATININE 0.95 11/14/2020  ? BILITOT 0.7 11/14/2020  ? ALKPHOS 66 11/14/2020  ? AST 21 11/14/2020  ? ALT 27 11/14/2020  ? PROT 7.1 11/14/2020  ? ALBUMIN 4.5 11/14/2020  ? CALCIUM 9.9 11/14/2020  ? GFR 83.85 11/14/2020  ? ?Lab Results  ?Component Value Date  ? CHOL 103 11/14/2020  ? ?Lab Results  ?Component Value Date  ? HDL 54.10 11/14/2020  ? ?Lab Results  ?Component Value Date  ? LDLCALC 38 11/14/2020  ? ?Lab Results  ?Component Value Date  ? TRIG 53.0 11/14/2020  ? ?Lab Results  ?Component Value Date  ? CHOLHDL 2 11/14/2020  ? ?Lab Results  ?Component Value Date  ? HGBA1C 7.7 (A) 10/04/2021  ? ? ?  ?Assessment & Plan:  ? ?Problem List Items Addressed This Visit   ? ?  ? Cardiovascular and Mediastinum  ? Essential hypertension  ? Relevant Orders  ? CBC with Differential/Platelet  ? Comprehensive metabolic panel  ? Urinalysis  ?  ? Other  ? HYPERCHOLESTEROLEMIA - Primary  ? Relevant Orders  ? Comprehensive metabolic panel  ? Lipid panel  ? ? ?No orders of the defined types were placed in this encounter. ? ? ?Follow-up: Return in about 6 months (around 05/12/2022).  ?Continue atorvastatin and lisinopril for elevated cholesterol and renal protection with blood pressure control.  Continue follow-up with endocrinology.  Up-to-date on health maintenance. ? ? ?Libby Maw, MD ?

## 2021-11-15 ENCOUNTER — Other Ambulatory Visit (HOSPITAL_COMMUNITY): Payer: Self-pay

## 2021-11-26 ENCOUNTER — Other Ambulatory Visit (HOSPITAL_COMMUNITY): Payer: Self-pay

## 2021-11-28 ENCOUNTER — Other Ambulatory Visit (HOSPITAL_COMMUNITY): Payer: Self-pay

## 2021-11-28 ENCOUNTER — Other Ambulatory Visit: Payer: Self-pay | Admitting: Endocrinology

## 2021-11-28 ENCOUNTER — Other Ambulatory Visit: Payer: Self-pay | Admitting: Family Medicine

## 2021-11-28 DIAGNOSIS — E7800 Pure hypercholesterolemia, unspecified: Secondary | ICD-10-CM

## 2021-11-28 MED ORDER — METFORMIN HCL ER 500 MG PO TB24
ORAL_TABLET | Freq: Every day | ORAL | 0 refills | Status: DC
Start: 2021-11-28 — End: 2022-02-15
  Filled 2021-11-28: qty 270, 90d supply, fill #0

## 2021-11-28 MED ORDER — ATORVASTATIN CALCIUM 40 MG PO TABS
ORAL_TABLET | Freq: Every day | ORAL | 3 refills | Status: DC
  Filled 2021-11-28: qty 90, 90d supply, fill #0
  Filled 2022-04-08: qty 90, 90d supply, fill #1
  Filled 2022-07-15: qty 90, 90d supply, fill #2
  Filled 2022-07-16 – 2022-10-10 (×2): qty 90, 90d supply, fill #3

## 2021-11-29 ENCOUNTER — Other Ambulatory Visit (HOSPITAL_COMMUNITY): Payer: Self-pay

## 2021-12-06 ENCOUNTER — Other Ambulatory Visit (HOSPITAL_COMMUNITY): Payer: Self-pay

## 2021-12-07 ENCOUNTER — Other Ambulatory Visit (HOSPITAL_COMMUNITY): Payer: Self-pay

## 2021-12-15 ENCOUNTER — Encounter (HOSPITAL_COMMUNITY): Payer: Self-pay | Admitting: Emergency Medicine

## 2021-12-15 ENCOUNTER — Ambulatory Visit (HOSPITAL_COMMUNITY)
Admission: EM | Admit: 2021-12-15 | Discharge: 2021-12-15 | Disposition: A | Discharge status: Home / Self Care | Payer: PPO | Attending: Emergency Medicine | Admitting: Emergency Medicine

## 2021-12-15 ENCOUNTER — Other Ambulatory Visit: Payer: Self-pay

## 2021-12-15 DIAGNOSIS — K047 Periapical abscess without sinus: Secondary | ICD-10-CM

## 2021-12-15 DIAGNOSIS — J069 Acute upper respiratory infection, unspecified: Secondary | ICD-10-CM

## 2021-12-15 MED ORDER — AMOXICILLIN 500 MG PO CAPS: 500.0000 mg | ORAL_CAPSULE | Freq: Three times a day (TID) | ORAL | 0 refills | Status: AC

## 2021-12-15 MED ORDER — PROMETHAZINE-DM 6.25-15 MG/5ML PO SYRP: 5.0000 mL | ORAL_SOLUTION | Freq: Four times a day (QID) | ORAL | 0 refills | Status: DC | PRN

## 2021-12-15 NOTE — ED Triage Notes (Signed)
Pt reports left facial swelling with pain since Thursday.  ?Cough started yesterday.  ?

## 2021-12-15 NOTE — ED Provider Notes (Signed)
?[place] ? ? ?MRN: [id number] DOB: 06/05/1956 ? ?Subjective:  ? ?Chief Complaint;  ?Chief Complaint  ?Patient presents with  ? Facial Swelling  ? Cough  ?Pt reports left facial swelling with pain since Thursday.  ?Cough started yesterday.   ? ?GARRIN KIRWAN is a 66 y.o. male presenting for left jaw swelling with dental abscess.  He denies fevers but does have a dry cough that has been keeping him up at night.  He denies sore throat, nausea or vomiting patient is incidentally a diabetic ? ? ?No Known Allergies ? ?Past Medical History:  ?Diagnosis Date  ? ABNORMAL ELECTROCARDIOGRAM 07/26/2009  ? DEPRESSION 03/31/2007  ? DIABETES MELLITUS, TYPE II 03/31/2007  ? Dyspepsia   ? 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 FATTY LIVER DISEASE 07/18/2008  ? GERD 07/26/2009  ? HSV-1 infection   ? HYPERCHOLESTEROLEMIA 10/31/2010  ? HYPERTENSION 03/31/2007  ? NASH (nonalcoholic steatohepatitis)   ? Personal history of colonic adenoma 02/10/2013  ?  ? ?Review of Systems  ?All other systems reviewed and are negative. ? ? ?Objective:  ? ?Vitals: ?BP 135/70 (BP Location: Left Arm)   Pulse 67   Temp 98.9 ?F (37.2 ?C) (Oral)   SpO2 97%  ? ?Physical Exam ?Vitals and nursing note reviewed.  ?Constitutional:   ?   General: He is not in acute distress. ?   Appearance: He is well-developed.  ?HENT:  ?   Head: Normocephalic and atraumatic.  ?   Mouth/Throat:  ?   Comments: Gingival abscess to left lower first molar lateral.  Several broken and missing teeth.  Gums are otherwise without significant gingivitis.  Left jaw is tender swelling with no evidence of secondary facial cellulitis ?Eyes:  ?   Conjunctiva/sclera: Conjunctivae normal.  ?Cardiovascular:  ?   Rate and Rhythm: Normal rate and regular rhythm.  ?   Heart sounds: No murmur heard. ?Pulmonary:  ?   Effort: Pulmonary effort is normal. No respiratory distress.  ?   Breath sounds: Normal breath sounds.  ?Abdominal:  ?   Palpations: Abdomen is soft.  ?    Tenderness: There is no abdominal tenderness.  ?Musculoskeletal:     ?   General: No swelling.  ?   Cervical back: Neck supple.  ?Skin: ?   General: Skin is warm and dry.  ?   Capillary Refill: Capillary refill takes less than 2 seconds.  ?Neurological:  ?   Mental Status: He is alert.  ?Psychiatric:     ?   Mood and Affect: Mood normal.  ? ? ?No results found for this or any previous visit (from the past 24 hour(s)). ? ?No results found.  ?  ? ?Assessment and Plan :  ? ?1. Dental abscess   ?2. Viral URI with cough   ? ? ?Meds ordered this encounter  ?Medications  ? amoxicillin (AMOXIL) 500 MG capsule  ?  Sig: Take 1 capsule (500 mg total) by mouth 3 (three) times daily for 10 days.  ?  Dispense:  21 capsule  ?  Refill:  0  ? promethazine-dextromethorphan (PROMETHAZINE-DM) 6.25-15 MG/5ML syrup  ?  Sig: Take 5 mLs by mouth 4 (four) times daily as needed for cough.  ?  Dispense:  118 mL  ?  Refill:  0  ? ? ?MDM:  ?FAYEZ STURGELL is a 66 y.o. male presenting for dental abscess lower left jaw.  He also incidentally has a dry cough that he started with  in the last 24 hours.  Patient has been afebrile without significant distress his exam is unremarkable except for the gingival abscess left lower jaw.  Patient was prescribed antibiotic and told to use over-the-counter medications as needed for his cough.  Family requested cough medicine for night since the cough is keeping him up at night.  I ordered promethazine dextromethorphan.  Patient is encouraged to follow-up with dentist as soon as possible as well as PCP if not improving or worse.  I discussed treatment, follow up and return instructions. Questions were answered. Patient stated understanding of instructions and is stable for discharge. ?Leida Lauth FNP-C MCN  ?  ?Hezzie Bump, NP ?12/15/21 1150 ? ?

## 2021-12-15 NOTE — Discharge Instructions (Addendum)
Follow-up with dentist as soon as possible.  Take antibiotic for dental abscess.  Take cough medicine at night as needed.  Follow-up with your primary care provider if not improving.  Your exam is consistent with viral cough upper respiratory infection.  Continue Motrin for pain as needed ?

## 2021-12-28 ENCOUNTER — Ambulatory Visit (INDEPENDENT_AMBULATORY_CARE_PROVIDER_SITE_OTHER): Payer: PPO | Admitting: Endocrinology

## 2021-12-28 ENCOUNTER — Other Ambulatory Visit (HOSPITAL_COMMUNITY): Payer: Self-pay

## 2021-12-28 VITALS — BP 122/66 | HR 82 | Ht 66.0 in | Wt 138.6 lb

## 2021-12-28 DIAGNOSIS — E119 Type 2 diabetes mellitus without complications: Secondary | ICD-10-CM

## 2021-12-28 LAB — POCT GLYCOSYLATED HEMOGLOBIN (HGB A1C): Hemoglobin A1C: 8 % — AB (ref 4.0–5.6)

## 2021-12-28 MED ORDER — CIPROFLOXACIN HCL 250 MG PO TABS: 250.0000 mg | ORAL_TABLET | Freq: Two times a day (BID) | ORAL | 0 refills | Status: AC

## 2021-12-28 MED ORDER — REPAGLINIDE 2 MG PO TABS
4.0000 mg | ORAL_TABLET | Freq: Three times a day (TID) | ORAL | 2 refills | Status: DC
Start: 2021-12-28 — End: 2022-07-05
  Filled 2021-12-28: qty 540, 90d supply, fill #0
  Filled 2022-02-15: qty 460, 76d supply, fill #0
  Filled 2022-02-15: qty 80, 14d supply, fill #0
  Filled 2022-06-03: qty 40, 7d supply, fill #1
  Filled 2022-06-04: qty 500, 83d supply, fill #1

## 2021-12-28 NOTE — Progress Notes (Signed)
? ?Subjective:  ? ? Patient ID: Elijah Cervantes, male    DOB: 08/30/1956, 67 y.o.   MRN: [id number] ? ?HPI ?Pt returns for f/u of diabetes mellitus:  ?DM type: 2 ?Dx'ed: 1996 ?Complications: none.   ?Therapy: 6 oral meds.   ?DKA: never ?Severe hypoglycemia: never.   ?Pancreatitis: never.   ?[place]: he works 2nd shift.   ?Other: he has declined acarbose and welchol; he declines insulin; however, wife is RN, so he could take in emergency if necessary; nausea and weight loss limit Rybelsus dosage.   ?Interval history: He takes med as rx'ed.  pt states he feels well in general.  He seldom checks cbg.  Wife says parlodel is expensive.   ?He is going to the [place] x 1 month.   ?Past Medical History:  ?Diagnosis Date  ? ABNORMAL ELECTROCARDIOGRAM 07/26/2009  ? DEPRESSION 03/31/2007  ? DIABETES MELLITUS, TYPE II 03/31/2007  ? Dyspepsia   ? 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 FATTY LIVER DISEASE 07/18/2008  ? GERD 07/26/2009  ? HSV-1 infection   ? HYPERCHOLESTEROLEMIA 10/31/2010  ? HYPERTENSION 03/31/2007  ? NASH (nonalcoholic steatohepatitis)   ? Personal history of colonic adenoma 02/10/2013  ? ? ?Past Surgical History:  ?Procedure Laterality Date  ? COLONOSCOPY    ? 2014 1 adenoma 2019 none  ? hernia repair    ? Stress Cardiolite  11/22/2003  ? ? ?Social History  ? ?Socioeconomic History  ? Marital status: Married  ?  Spouse name: Not on file  ? Number of children: Not on file  ? Years of education: Not on file  ? Highest education level: Not on file  ?Occupational History  ? Occupation: Unemployed  ?Tobacco Use  ? Smoking status: Never  ? Smokeless tobacco: Never  ?Substance and Sexual Activity  ? Alcohol use: Yes  ?  Comment: rarely  ? Drug use: No  ? Sexual activity: Not on file  ?Other Topics Concern  ? Not on file  ?Social History Narrative  ? Not on file  ? ?Social Determinants of Health  ? ?Financial Resource Strain: Not on file  ?Food Insecurity: Not on file  ?Transportation Needs: Not on file  ?Physical  Activity: Not on file  ?Stress: Not on file  ?Social Connections: Not on file  ?Intimate Partner Violence: Not on file  ? ? ?Current Outpatient Medications on File Prior to Visit  ?Medication Sig Dispense Refill  ? aspirin 81 MG tablet Take 1 tablet (81 mg total) by mouth daily. 30 tablet 0  ? atorvastatin (LIPITOR) 40 MG tablet TAKE 1 TABLET BY MOUTH ONCE A DAY 90 tablet 3  ? empagliflozin (JARDIANCE) 25 MG TABS tablet Take 1 tablet (25 mg total) by mouth daily. 90 tablet 3  ? glucose monitoring kit (FREESTYLE) monitoring kit 1 each by Does not apply route as needed for other. 1 each 0  ? Lancets (FREESTYLE) lancets Use to test blood sugar once daily 100 each 3  ? lisinopril (ZESTRIL) 5 MG tablet TAKE 1 TABLET BY MOUTH ONCE A DAY 90 tablet 2  ? metFORMIN (GLUCOPHAGE-XR) 500 MG 24 hr tablet TAKE 3 TABLETS BY MOUTH DAILY 270 tablet 0  ? pioglitazone (ACTOS) 45 MG tablet TAKE 1 TABLET BY MOUTH ONCE A DAY 90 tablet 3  ? promethazine-dextromethorphan (PROMETHAZINE-DM) 6.25-15 MG/5ML syrup Take 5 mLs by mouth 4 (four) times daily as needed for cough. 118 mL 0  ? Semaglutide (RYBELSUS) 3 MG TABS Take 1 tablet by mouth daily. [place]  tablet 3  ? ?No current facility-administered medications on file prior to visit.  ? ? ?No Known Allergies ? ?Family History  ?Problem Relation Age of Onset  ? Diabetes Mother   ? Diabetes Brother   ? Cancer Neg Hx   ? Colon cancer Neg Hx   ? Esophageal cancer Neg Hx   ? Stomach cancer Neg Hx   ? Rectal cancer Neg Hx   ? Colon polyps Neg Hx   ? ? ?BP 122/66 (BP Location: Left Arm, Patient Position: Sitting, Cuff Size: Normal)   Pulse 82   Ht $R'5\' 6"'JJ$  (1.676 m)   Wt 138 lb 9.6 oz (62.9 kg)   SpO2 96%   BMI 22.37 kg/m?  ? ?Review of Systems ?Denies N/HB.   ?   ?Objective:  ? Physical Exam ?VITAL SIGNS:  See vs page.   ?GENERAL: no distress. ? ? ?A1c=8.0% ?   ?Assessment & Plan:  ?Type 2 DM: uncontrolled ?Travel planning.  We discussed rx.  ? ?Patient Instructions  ?You can stop taking the  bromocriptine.   ?I have sent a prescription to your pharmacy, to increase the repaglinide.   ?Please continue the same other 4 diabetes medications.   ?check your blood sugar once a day.  vary the time of day when you check, between before the 3 meals, and at bedtime.  also check if you have symptoms of your blood sugar being too high or too low.  please keep a record of the readings and bring it to your next appointment here (or you can bring the meter itself).  You can write it on any piece of paper.  please call us sooner if your blood sugar goes below 70, or if you have a lot of readings over 200.   ?Also, I have sent a prescription to your pharmacy, for the cipro.  Take if you get diarrhea.   ?You should have an endocrinology follow-up appointment in 3 months.   ? ? ? ?

## 2021-12-28 NOTE — Patient Instructions (Addendum)
You can stop taking the bromocriptine.   ?I have sent a prescription to your pharmacy, to increase the repaglinide.   ?Please continue the same other 4 diabetes medications.   ?check your blood sugar once a day.  vary the time of day when you check, between before the 3 meals, and at bedtime.  also check if you have symptoms of your blood sugar being too high or too low.  please keep a record of the readings and bring it to your next appointment here (or you can bring the meter itself).  You can write it on any piece of paper.  please call us sooner if your blood sugar goes below 70, or if you have a lot of readings over 200.   ?Also, I have sent a prescription to your pharmacy, for the cipro.  Take if you get diarrhea.   ?You should have an endocrinology follow-up appointment in 3 months.   ? ?

## 2022-01-03 ENCOUNTER — Ambulatory Visit: Payer: PPO | Admitting: Endocrinology

## 2022-02-12 ENCOUNTER — Other Ambulatory Visit (HOSPITAL_COMMUNITY): Payer: Self-pay

## 2022-02-15 ENCOUNTER — Other Ambulatory Visit (HOSPITAL_COMMUNITY): Payer: Self-pay

## 2022-02-15 ENCOUNTER — Other Ambulatory Visit: Payer: Self-pay | Admitting: Endocrinology

## 2022-02-15 MED ORDER — METFORMIN HCL ER 500 MG PO TB24
ORAL_TABLET | Freq: Every day | ORAL | 0 refills | Status: DC
Start: 2022-02-15 — End: 2022-06-03
  Filled 2022-02-15: qty 270, 90d supply, fill #0

## 2022-02-18 ENCOUNTER — Other Ambulatory Visit (HOSPITAL_COMMUNITY): Payer: Self-pay

## 2022-03-11 ENCOUNTER — Other Ambulatory Visit (HOSPITAL_COMMUNITY): Payer: Self-pay

## 2022-03-11 NOTE — Telephone Encounter (Signed)
Patient Advocate Encounter  Prior Authorization for Rybelsus '3mg'$  has been approved.  Per Cover My Meds. - no other information given. No fax received.

## 2022-03-25 ENCOUNTER — Encounter: Payer: Self-pay | Admitting: Endocrinology

## 2022-03-26 ENCOUNTER — Ambulatory Visit: Payer: PPO | Admitting: Endocrinology

## 2022-04-08 ENCOUNTER — Other Ambulatory Visit (HOSPITAL_COMMUNITY): Payer: Self-pay

## 2022-04-10 ENCOUNTER — Other Ambulatory Visit (HOSPITAL_COMMUNITY): Payer: Self-pay

## 2022-05-07 ENCOUNTER — Other Ambulatory Visit: Payer: Self-pay | Admitting: Family Medicine

## 2022-05-07 ENCOUNTER — Other Ambulatory Visit (HOSPITAL_COMMUNITY): Payer: Self-pay

## 2022-05-07 DIAGNOSIS — I10 Essential (primary) hypertension: Secondary | ICD-10-CM

## 2022-05-07 MED ORDER — LISINOPRIL 5 MG PO TABS
ORAL_TABLET | Freq: Every day | ORAL | 0 refills | Status: DC
  Filled 2022-05-07: qty 90, 90d supply, fill #0

## 2022-05-08 ENCOUNTER — Other Ambulatory Visit (HOSPITAL_COMMUNITY): Payer: Self-pay

## 2022-05-10 ENCOUNTER — Other Ambulatory Visit (HOSPITAL_COMMUNITY): Payer: Self-pay

## 2022-05-13 ENCOUNTER — Other Ambulatory Visit (HOSPITAL_COMMUNITY): Payer: Self-pay

## 2022-05-23 ENCOUNTER — Telehealth: Payer: Self-pay

## 2022-05-23 NOTE — Patient Outreach (Signed)
  Care Coordination   05/23/2022 Name: Elijah Cervantes MRN: [id number] DOB: 10/16/1955   Care Coordination Outreach Attempts:  An unsuccessful telephone outreach was attempted today to offer the patient information about available care coordination services as a benefit of their health plan.   Follow Up Plan:  Additional outreach attempts will be made to offer the patient care coordination information and services.   Encounter Outcome:  No Answer  Care Coordination Interventions Activated:  No   Care Coordination Interventions:  No, not indicated    Jone Baseman, RN, MSN [facility] Care Management Care Management Coordinator Direct Line [phone]

## 2022-05-24 ENCOUNTER — Ambulatory Visit (INDEPENDENT_AMBULATORY_CARE_PROVIDER_SITE_OTHER): Payer: PPO | Admitting: Family Medicine

## 2022-05-24 ENCOUNTER — Encounter: Payer: Self-pay | Admitting: Family Medicine

## 2022-05-24 VITALS — BP 140/66 | HR 64 | Temp 97.1°F | Ht 66.0 in | Wt 148.0 lb

## 2022-05-24 DIAGNOSIS — E7800 Pure hypercholesterolemia, unspecified: Secondary | ICD-10-CM | POA: Diagnosis not present

## 2022-05-24 DIAGNOSIS — I10 Essential (primary) hypertension: Secondary | ICD-10-CM

## 2022-05-24 DIAGNOSIS — Z23 Encounter for immunization: Secondary | ICD-10-CM | POA: Diagnosis not present

## 2022-05-24 DIAGNOSIS — Z0000 Encounter for general adult medical examination without abnormal findings: Secondary | ICD-10-CM | POA: Diagnosis not present

## 2022-05-24 LAB — URINALYSIS, ROUTINE W REFLEX MICROSCOPIC
Bilirubin Urine: NEGATIVE
Hgb urine dipstick: NEGATIVE
Ketones, ur: NEGATIVE
Leukocytes,Ua: NEGATIVE
Nitrite: NEGATIVE
Specific Gravity, Urine: 1.015 (ref 1.000–1.030)
Total Protein, Urine: NEGATIVE
Urine Glucose: 250 — AB
Urobilinogen, UA: 0.2 (ref 0.0–1.0)
WBC, UA: NONE SEEN (ref 0–?)
pH: 6 (ref 5.0–8.0)

## 2022-05-24 LAB — MICROALBUMIN / CREATININE URINE RATIO
Creatinine,U: 72.2 mg/dL
Microalb Creat Ratio: 9.2 mg/g (ref 0.0–30.0)
Microalb, Ur: 6.6 mg/dL — ABNORMAL HIGH (ref 0.0–1.9)

## 2022-05-24 LAB — CBC
HCT: 40.9 % (ref 39.0–52.0)
Hemoglobin: 13.6 g/dL (ref 13.0–17.0)
MCHC: 33.3 g/dL (ref 30.0–36.0)
MCV: 97.5 fl (ref 78.0–100.0)
Platelets: 164 10*3/uL (ref 150.0–400.0)
RBC: 4.2 Mil/uL — ABNORMAL LOW (ref 4.22–5.81)
RDW: 12.7 % (ref 11.5–15.5)
WBC: 4.1 10*3/uL (ref 4.0–10.5)

## 2022-05-24 MED ORDER — LISINOPRIL 10 MG PO TABS: 10.0000 mg | ORAL_TABLET | Freq: Every day | ORAL | 1 refills | Status: DC

## 2022-05-24 NOTE — Progress Notes (Signed)
Established Patient Office Visit  Subjective   Patient ID: Elijah Cervantes, male    DOB: 21-Oct-1955  Age: 66 y.o. MRN: [id number]  Chief Complaint  Patient presents with   Follow-up    6 month follow up, no concerns. Patient fasting.     HPI for health check and follow-up hypertension elevated cholesterol.  Has gained 10 pounds.  He is active at work but has no regular exercise outside of that.  He does have regular dental care.  Follow-up with endocrinology is in a month.  Continues with lisinopril 5 mg and atorvastatin 40 mg.  Blood pressure previously controlled well with the 5 mg pill    Review of Systems  Constitutional: Negative.   HENT: Negative.    Eyes:  Negative for blurred vision, discharge and redness.  Respiratory: Negative.    Cardiovascular: Negative.   Gastrointestinal:  Negative for abdominal pain.  Genitourinary: Negative.  Negative for frequency and urgency.  Musculoskeletal: Negative.  Negative for myalgias.  Skin:  Negative for rash.  Neurological:  Negative for tingling, loss of consciousness and weakness.  Endo/Heme/Allergies:  Negative for polydipsia.      05/24/2022    8:07 AM 05/11/2021   10:37 AM 11/08/2020   11:37 AM  Depression screen PHQ 2/9  Decreased Interest 0 0 0  Down, Depressed, Hopeless 0 0 0  PHQ - 2 Score 0 0 0       Objective:     BP (!) 140/66 (BP Location: Right Arm, Patient Position: Sitting, Cuff Size: Normal)   Pulse 64   Temp (!) 97.1 F (36.2 C) (Temporal)   Ht 5' 6"  (1.676 m)   Wt 148 lb (67.1 kg)   SpO2 96%   BMI 23.89 kg/m  BP Readings from Last 3 Encounters:  05/24/22 (!) 140/66  12/28/21 122/66  12/15/21 135/70   Wt Readings from Last 3 Encounters:  05/24/22 148 lb (67.1 kg)  12/28/21 138 lb 9.6 oz (62.9 kg)  11/09/21 139 lb 12.8 oz (63.4 kg)      Physical Exam Constitutional:      General: He is not in acute distress.    Appearance: Normal appearance. He is not ill-appearing, toxic-appearing or  diaphoretic.  HENT:     Head: Normocephalic and atraumatic.     Right Ear: External ear normal.     Left Ear: External ear normal.     Mouth/Throat:     Mouth: Mucous membranes are moist.     Pharynx: Oropharynx is clear. No oropharyngeal exudate or posterior oropharyngeal erythema.  Eyes:     General: No scleral icterus.       Right eye: No discharge.        Left eye: No discharge.     Extraocular Movements: Extraocular movements intact.     Conjunctiva/sclera: Conjunctivae normal.     Pupils: Pupils are equal, round, and reactive to light.  Cardiovascular:     Rate and Rhythm: Normal rate and regular rhythm.  Pulmonary:     Effort: Pulmonary effort is normal. No respiratory distress.     Breath sounds: Normal breath sounds.  Abdominal:     General: Bowel sounds are normal.  Musculoskeletal:     Cervical back: No rigidity or tenderness.  Skin:    General: Skin is warm and dry.  Neurological:     Mental Status: He is alert and oriented to person, place, and time.  Psychiatric:        Mood and  Affect: Mood normal.        Behavior: Behavior normal.      No results found for any visits on 05/24/22.    The ASCVD Risk score (Arnett DK, et al., 2019) failed to calculate for the following reasons:   The valid total cholesterol range is 130 to 320 mg/dL    Assessment & Plan:   Problem List Items Addressed This Visit       Cardiovascular and Mediastinum   Essential hypertension   Relevant Medications   lisinopril (ZESTRIL) 10 MG tablet   Other Relevant Orders   Urinalysis, Routine w reflex microscopic   Microalbumin / creatinine urine ratio   CBC   Comprehensive metabolic panel     Other   HYPERCHOLESTEROLEMIA   Relevant Medications   lisinopril (ZESTRIL) 10 MG tablet   Other Relevant Orders   Lipid panel   Healthcare maintenance - Primary   Relevant Orders   PSA   Other Visit Diagnoses     Need for influenza vaccination       Relevant Orders   Flu  vaccine HIGH DOSE PF (Fluzone High dose) (Completed)       Return in about 3 months (around 08/23/2022), or Advise patient to have covid and rsv vaccine per pharmacy..  Information was given on managing hypertension and exercising to lose weight.  Advised him to exercise for 30 minutes at least 5 days weekly outside of work.  Advised him to lose some weight.  Advised him to be mindful of his sodium intake.  Flu vaccine today.  Have increased lisinopril to 10 mg.  Libby Maw, MD

## 2022-05-27 ENCOUNTER — Telehealth: Payer: Self-pay | Admitting: Family Medicine

## 2022-05-27 ENCOUNTER — Telehealth: Payer: Self-pay

## 2022-05-27 ENCOUNTER — Other Ambulatory Visit (HOSPITAL_COMMUNITY): Payer: Self-pay

## 2022-05-27 ENCOUNTER — Other Ambulatory Visit: Payer: Self-pay

## 2022-05-27 DIAGNOSIS — I10 Essential (primary) hypertension: Secondary | ICD-10-CM

## 2022-05-27 MED ORDER — LISINOPRIL 10 MG PO TABS
10.0000 mg | ORAL_TABLET | Freq: Every day | ORAL | 1 refills | Status: DC
  Filled 2022-05-27 – 2022-05-30 (×3): qty 90, 90d supply, fill #0
  Filled 2022-08-02: qty 30, 30d supply, fill #1

## 2022-05-27 NOTE — Telephone Encounter (Signed)
Patient scheduled to come back in for repeat labs. Does Dr. [name] need to order the labs, if so what labs are needed.

## 2022-05-27 NOTE — Telephone Encounter (Signed)
Caller Name: Benjamine Mola wife Call back phone #: [phone]  Reason for Call: She is asking why more labs and to please send his meds to [facility]  outpatient.  Please call her.

## 2022-05-27 NOTE — Telephone Encounter (Signed)
LVM, for pt to call office to schedule lab visit. Serum hemolyzed, lab could not test.

## 2022-05-27 NOTE — Telephone Encounter (Signed)
FYI: spoke with Patients wife who states that patients BP readings at home are never over 120 and states that patient will not be taking the increased Lisinopril she also states that patient is on Lisinopril for something else not BP.

## 2022-05-28 NOTE — Telephone Encounter (Signed)
Spoke with patient and wife who verbally understood message below and states that they will start on new does.

## 2022-05-28 NOTE — Addendum Note (Signed)
Addended by: Lynnea Ferrier on: 05/28/2022 10:41 AM   Modules accepted: Orders

## 2022-05-29 ENCOUNTER — Other Ambulatory Visit (HOSPITAL_COMMUNITY): Payer: Self-pay

## 2022-05-30 ENCOUNTER — Other Ambulatory Visit (HOSPITAL_COMMUNITY): Payer: Self-pay

## 2022-05-31 ENCOUNTER — Other Ambulatory Visit (INDEPENDENT_AMBULATORY_CARE_PROVIDER_SITE_OTHER): Payer: PPO

## 2022-05-31 DIAGNOSIS — I10 Essential (primary) hypertension: Secondary | ICD-10-CM

## 2022-05-31 DIAGNOSIS — Z0000 Encounter for general adult medical examination without abnormal findings: Secondary | ICD-10-CM

## 2022-05-31 DIAGNOSIS — E7800 Pure hypercholesterolemia, unspecified: Secondary | ICD-10-CM

## 2022-05-31 LAB — LIPID PANEL
Cholesterol: 132 mg/dL (ref 0–200)
HDL: 59.5 mg/dL (ref >39.00)
LDL Cholesterol: 59 mg/dL (ref 0–99)
NonHDL: 72.14
Total CHOL/HDL Ratio: 2
Triglycerides: 68 mg/dL (ref 0.0–149.0)
VLDL: 13.6 mg/dL (ref 0.0–40.0)

## 2022-05-31 LAB — COMPREHENSIVE METABOLIC PANEL
ALT: 44 U/L (ref 0–53)
AST: 27 U/L (ref 0–37)
Albumin: 4.2 g/dL (ref 3.5–5.2)
Alkaline Phosphatase: 71 U/L (ref 39–117)
BUN: 16 mg/dL (ref 6–23)
CO2: 31 mEq/L (ref 19–32)
Calcium: 9.5 mg/dL (ref 8.4–10.5)
Chloride: 100 mEq/L (ref 96–112)
Creatinine, Ser: 0.94 mg/dL (ref 0.40–1.50)
GFR: 84.01 mL/min (ref >60.00)
Glucose, Bld: 197 mg/dL — ABNORMAL HIGH (ref 70–99)
Potassium: 4.1 mEq/L (ref 3.5–5.1)
Sodium: 138 mEq/L (ref 135–145)
Total Bilirubin: 0.9 mg/dL (ref 0.2–1.2)
Total Protein: 6.6 g/dL (ref 6.0–8.3)

## 2022-05-31 LAB — PSA: PSA: 1.77 ng/mL (ref 0.10–4.00)

## 2022-06-03 ENCOUNTER — Other Ambulatory Visit (HOSPITAL_COMMUNITY): Payer: Self-pay

## 2022-06-03 ENCOUNTER — Other Ambulatory Visit: Payer: Self-pay | Admitting: Endocrinology

## 2022-06-03 MED ORDER — METFORMIN HCL ER 500 MG PO TB24
1500.0000 mg | ORAL_TABLET | Freq: Every day | ORAL | 0 refills | Status: DC
  Filled 2022-06-03: qty 270, 90d supply, fill #0

## 2022-06-04 ENCOUNTER — Telehealth: Payer: Self-pay

## 2022-06-04 ENCOUNTER — Other Ambulatory Visit (HOSPITAL_COMMUNITY): Payer: Self-pay

## 2022-06-04 NOTE — Patient Outreach (Signed)
  Care Coordination   06/04/2022 Name: Elijah Cervantes MRN: [id number] DOB: 07-03-1956   Care Coordination Outreach Attempts:  A second unsuccessful outreach was attempted today to offer the patient with information about available care coordination services as a benefit of their health plan.     Follow Up Plan:  Additional outreach attempts will be made to offer the patient care coordination information and services.   Encounter Outcome:  No Answer  Care Coordination Interventions Activated:  No   Care Coordination Interventions:  No, not indicated    Jone Baseman, RN, MSN [facility] Care Management Care Management Coordinator Direct Line [phone]

## 2022-06-05 ENCOUNTER — Encounter: Payer: Self-pay | Admitting: Family Medicine

## 2022-06-05 ENCOUNTER — Other Ambulatory Visit (HOSPITAL_COMMUNITY): Payer: Self-pay

## 2022-06-05 DIAGNOSIS — H9313 Tinnitus, bilateral: Secondary | ICD-10-CM

## 2022-06-06 ENCOUNTER — Other Ambulatory Visit (HOSPITAL_COMMUNITY): Payer: Self-pay

## 2022-06-13 ENCOUNTER — Telehealth: Payer: Self-pay

## 2022-06-13 NOTE — Patient Outreach (Signed)
  Care Coordination   06/13/2022 Name: Elijah Cervantes MRN: [id number] DOB: [birth year]   Care Coordination Outreach Attempts:  A third unsuccessful outreach was attempted today to offer the patient with information about available care coordination services as a benefit of their health plan.   Follow Up Plan:  No further outreach attempts will be made at this time. We have been unable to contact the patient to offer or enroll patient in care coordination services  Encounter Outcome:  No Answer  Care Coordination Interventions Activated:  No   Care Coordination Interventions:  No, not indicated    Jone Baseman, RN, MSN [facility] Care Management Coordinator Direct Line [phone]

## 2022-06-17 ENCOUNTER — Encounter: Payer: Self-pay | Admitting: Family Medicine

## 2022-06-26 ENCOUNTER — Other Ambulatory Visit: Payer: Self-pay | Admitting: Endocrinology

## 2022-06-26 DIAGNOSIS — E119 Type 2 diabetes mellitus without complications: Secondary | ICD-10-CM

## 2022-06-28 ENCOUNTER — Other Ambulatory Visit (INDEPENDENT_AMBULATORY_CARE_PROVIDER_SITE_OTHER): Payer: PPO

## 2022-06-28 DIAGNOSIS — E119 Type 2 diabetes mellitus without complications: Secondary | ICD-10-CM

## 2022-06-28 LAB — GLUCOSE, RANDOM: Glucose, Bld: 144 mg/dL — ABNORMAL HIGH (ref 70–99)

## 2022-06-28 LAB — HEMOGLOBIN A1C: Hgb A1c MFr Bld: 9.6 % — ABNORMAL HIGH (ref 4.6–6.5)

## 2022-07-05 ENCOUNTER — Other Ambulatory Visit (HOSPITAL_COMMUNITY): Payer: Self-pay

## 2022-07-05 ENCOUNTER — Encounter: Payer: Self-pay | Admitting: Endocrinology

## 2022-07-05 ENCOUNTER — Ambulatory Visit: Payer: PPO | Admitting: Endocrinology

## 2022-07-05 VITALS — BP 140/62 | HR 75 | Ht 66.0 in | Wt 150.4 lb

## 2022-07-05 DIAGNOSIS — R635 Abnormal weight gain: Secondary | ICD-10-CM

## 2022-07-05 DIAGNOSIS — E1165 Type 2 diabetes mellitus with hyperglycemia: Secondary | ICD-10-CM

## 2022-07-05 LAB — POCT GLUCOSE (DEVICE FOR HOME USE): POC Glucose: 219 mg/dl — AB (ref 70–99)

## 2022-07-05 MED ORDER — REPAGLINIDE 2 MG PO TABS
4.0000 mg | ORAL_TABLET | Freq: Three times a day (TID) | ORAL | 2 refills | Status: DC
  Filled 2022-07-05: qty 540, 90d supply, fill #0
  Filled 2022-08-02: qty 150, 30d supply, fill #0

## 2022-07-05 MED ORDER — FREESTYLE LITE TEST VI STRP
ORAL_STRIP | 12 refills | Status: DC
  Filled 2022-07-05: qty 100, 50d supply, fill #0

## 2022-07-05 NOTE — Progress Notes (Unsigned)
Patient ID: Elijah Cervantes, male   DOB: July 20, 1956, 66 y.o.   MRN: [id number]            Reason for Appointment: Type II Diabetes follow-up   History of Present Illness   Diagnosis date: 1996  Previous history:  Non-insulin hypoglycemic drugs previously used:  A1c range in the last few years is:  Recent history:     Non-insulin hypoglycemic drugs: rep 2x      Insulin regimen:            Side effects from medications: None  Current self management, blood sugar patterns and problems identified:  A1c is  Exercise:  Diet management: Meal times:  10pm, 8 am, 4 pm      Hypoglycemia:  none    Glucometer: One Touch.           Blood Glucose readings  meter download:   PRE-MEAL Fasting Lunch Dinner Bedtime Overall  Glucose range: 149-162      Mean/median:         Dietician visit: Most recent:   none   Weight control:  Wt Readings from Last 3 Encounters:  07/05/22 150 lb 6.4 oz (68.2 kg)  05/24/22 148 lb (67.1 kg)  12/28/21 138 lb 9.6 oz (62.9 kg)            Diabetes labs:  Lab Results  Component Value Date   HGBA1C 9.6 (H) 06/28/2022   HGBA1C 8.0 (A) 12/28/2021   HGBA1C 7.7 (A) 10/04/2021   Lab Results  Component Value Date   MICROALBUR 6.6 (H) 05/24/2022   LDLCALC 59 05/31/2022   CREATININE 0.94 05/31/2022    No results found for: "FRUCTOSAMINE"   Allergies as of 07/05/2022   No Known Allergies      Medication List        Accurate as of July 05, 2022 11:53 AM. If you have any questions, ask your nurse or doctor.          STOP taking these medications    Jardiance 25 MG Tabs tablet Generic drug: empagliflozin Stopped by: Elayne Snare, MD   Rybelsus 3 MG Tabs Generic drug: Semaglutide Stopped by: Elayne Snare, MD       TAKE these medications    aspirin 81 MG tablet Take 1 tablet (81 mg total) by mouth daily.   atorvastatin 40 MG tablet Commonly known as: LIPITOR TAKE 1 TABLET BY MOUTH ONCE A DAY   freestyle  lancets Use to test blood sugar once daily   FREESTYLE LITE test strip Generic drug: glucose blood Use to check blood sugar twice a day Started by: Elayne Snare, MD   glucose monitoring kit monitoring kit 1 each by Does not apply route as needed for other.   lisinopril 10 MG tablet Commonly known as: ZESTRIL Take 1 tablet (10 mg total) by mouth daily.   metFORMIN 500 MG 24 hr tablet Commonly known as: GLUCOPHAGE-XR Take 3 tablets (1,500 mg total) by mouth daily.   pioglitazone 45 MG tablet Commonly known as: ACTOS TAKE 1 TABLET BY MOUTH ONCE A DAY   repaglinide 2 MG tablet Commonly known as: PRANDIN Take 2 tablets  by mouth 3  times daily before meals.        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  FATTY LIVER DISEASE 07/18/2008   GERD 07/26/2009   HSV-1 infection    HYPERCHOLESTEROLEMIA 10/31/2010   HYPERTENSION 03/31/2007   NASH (nonalcoholic steatohepatitis)    Personal history of colonic adenoma 02/10/2013    Past Surgical History:  Procedure Laterality Date   COLONOSCOPY     2014 1 adenoma 2019 none   hernia repair     Stress Cardiolite  11/22/2003    Family History  Problem Relation Age of Onset   Diabetes Mother    Diabetes Brother    Cancer Neg Hx    Colon cancer Neg Hx    Esophageal cancer Neg Hx    Stomach cancer Neg Hx    Rectal cancer Neg Hx    Colon polyps Neg Hx     Social History:  reports that he has never smoked. He has never used smokeless tobacco. He reports current alcohol use. He reports that he does not use drugs.  Review of Systems:  Last diabetic eye exam date  Last urine microalbumin date:  Last foot exam date:  Symptoms of neuropathy:  Hypertension:   ACE/ARB medication: L  BP Readings from Last 3 Encounters:  07/05/22 (!)  140/62  05/24/22 (!) 140/66  12/28/21 122/66    Lipid management:L    Lab Results  Component Value Date   CHOL 132 05/31/2022   CHOL 126 11/09/2021   CHOL 103 11/14/2020   Lab Results  Component Value Date   HDL 59.50 05/31/2022   HDL 62.60 11/09/2021   HDL 54.10 11/14/2020   Lab Results  Component Value Date   LDLCALC 59 05/31/2022   LDLCALC 51 11/09/2021   LDLCALC 38 11/14/2020   Lab Results  Component Value Date   TRIG 68.0 05/31/2022   TRIG 62.0 11/09/2021   TRIG 53.0 11/14/2020   Lab Results  Component Value Date   CHOLHDL 2 05/31/2022   CHOLHDL 2 11/09/2021   CHOLHDL 2 11/14/2020   Lab Results  Component Value Date   LDLDIRECT 51.0 02/28/2020   LDLDIRECT 48.0 10/28/2019   LDLDIRECT 38.0 04/29/2019     Fibrosis 4 Score = 1.66       Fib-4 interpretation is not validated for people under 26 or over 73 years of age.      Examination:   BP (!) 140/62   Pulse 75   Ht _0  (1.676 m)   Wt 150 lb 6.4 oz (68.2 kg)   SpO2 97%   BMI 24.28 kg/m   Body mass index is 24.28 kg/m.    ASSESSMENT/ PLAN:    Diabetes type 2:   Current regimen:  See history of present illness for detailed discussion of current diabetes management, blood sugar patterns and problems identified  A1c is  Recommendations:  Start taking Prandin within 30 minutes of meals rather than taking it randomly at any given time He will continue to take his medication with him to work when he is eating lunch He does need to avoid high carbohydrate intake and reduce portions of rice as well as make sure he is adding some protein to every meal including breakfast, discussed sources of protein He does need to have aerobic exercise regimen and discussed that his activity at work is not adequate for benefits for his diabetes Needs to start checking his blood sugars regularly including 1 to 2 hours after meals New prescription for test strips has been sent Consultation with nutritionist for  meal planning May consider restarting either Rybelsus or Jardiance in  January when he is able to afford this Follow-up in 3 months  Patient Instructions  Exercise 3x weekly  Add protein to each meal  Take Prandin within 30 min of each meal   Elayne Snare 07/05/2022, 11:53 AM

## 2022-07-05 NOTE — Patient Instructions (Addendum)
Exercise 3x weekly  Add protein to each meal  Take Prandin within 30 min of each meal

## 2022-07-09 ENCOUNTER — Telehealth: Payer: Self-pay | Admitting: Family Medicine

## 2022-07-09 NOTE — Telephone Encounter (Signed)
The referral Dr. [name] placed on 06/06/22 to Dr Redmond Baseman, DWIGHT, his office is wanting the referral amended to say Audio ONly. Pt is going out of [place] from [place] to [place]. They can schedule this appointment much sooner if it's amended. Patricia's fax # [phone].

## 2022-07-10 ENCOUNTER — Other Ambulatory Visit: Payer: Self-pay

## 2022-07-10 NOTE — Telephone Encounter (Signed)
Spoke with Mardene Celeste who states that due to patient leaving the country mid December they are wanting something sooner to have hearing checked. Mardene Celeste will have referral changed to audio only results will be sent for Dr. [name] to decide if any further appointments are needed.

## 2022-07-10 NOTE — Telephone Encounter (Signed)
Caryn Section for clarification on what is is needed for referral. She was on the phone was informed Mardene Celeste will call back.

## 2022-07-15 ENCOUNTER — Other Ambulatory Visit (HOSPITAL_COMMUNITY): Payer: Self-pay

## 2022-07-16 ENCOUNTER — Other Ambulatory Visit (HOSPITAL_COMMUNITY): Payer: Self-pay

## 2022-07-23 DIAGNOSIS — H524 Presbyopia: Secondary | ICD-10-CM | POA: Diagnosis not present

## 2022-07-23 DIAGNOSIS — H40023 Open angle with borderline findings, high risk, bilateral: Secondary | ICD-10-CM | POA: Diagnosis not present

## 2022-07-23 LAB — HM DIABETES EYE EXAM

## 2022-07-24 DIAGNOSIS — H9193 Unspecified hearing loss, bilateral: Secondary | ICD-10-CM | POA: Diagnosis not present

## 2022-07-26 ENCOUNTER — Other Ambulatory Visit (HOSPITAL_COMMUNITY): Payer: Self-pay

## 2022-08-02 ENCOUNTER — Other Ambulatory Visit: Payer: Self-pay | Admitting: Endocrinology

## 2022-08-02 ENCOUNTER — Other Ambulatory Visit (HOSPITAL_COMMUNITY): Payer: Self-pay

## 2022-08-02 MED ORDER — METFORMIN HCL ER 500 MG PO TB24
1500.0000 mg | ORAL_TABLET | Freq: Every day | ORAL | 0 refills | Status: DC
  Filled 2022-08-02 – 2022-08-05 (×2): qty 270, 90d supply, fill #0

## 2022-08-05 ENCOUNTER — Other Ambulatory Visit (HOSPITAL_COMMUNITY): Payer: Self-pay

## 2022-10-02 ENCOUNTER — Telehealth: Payer: Self-pay | Admitting: Family Medicine

## 2022-10-02 NOTE — Telephone Encounter (Signed)
Left message for patient to call back and schedule Medicare Annual Wellness Visit (AWV) either virtually or in office. Left  my Herbie Drape number [phone]    DUE 09/02/2022 AWVI PER [place] please schedule with Nurse Health Adviser   45 min for awv-i  in office appointments 30 min for awv-s & awv-i phone/virtual appointments

## 2022-10-04 ENCOUNTER — Ambulatory Visit: Payer: PPO | Admitting: Dietician

## 2022-10-10 ENCOUNTER — Other Ambulatory Visit (HOSPITAL_COMMUNITY): Payer: Self-pay

## 2022-10-11 ENCOUNTER — Other Ambulatory Visit: Payer: Self-pay

## 2022-10-11 ENCOUNTER — Encounter: Payer: Self-pay | Admitting: Family Medicine

## 2022-10-11 ENCOUNTER — Other Ambulatory Visit (HOSPITAL_BASED_OUTPATIENT_CLINIC_OR_DEPARTMENT_OTHER): Payer: Self-pay

## 2022-10-11 ENCOUNTER — Telehealth: Payer: Self-pay

## 2022-10-11 ENCOUNTER — Ambulatory Visit (INDEPENDENT_AMBULATORY_CARE_PROVIDER_SITE_OTHER): Payer: PPO | Admitting: Family Medicine

## 2022-10-11 VITALS — BP 140/84 | HR 79 | Temp 97.8°F | Ht 66.0 in | Wt 148.6 lb

## 2022-10-11 DIAGNOSIS — I10 Essential (primary) hypertension: Secondary | ICD-10-CM | POA: Diagnosis not present

## 2022-10-11 DIAGNOSIS — E7800 Pure hypercholesterolemia, unspecified: Secondary | ICD-10-CM | POA: Diagnosis not present

## 2022-10-11 MED ORDER — LISINOPRIL 20 MG PO TABS
20.0000 mg | ORAL_TABLET | Freq: Every day | ORAL | 3 refills | Status: DC
  Filled 2022-10-11: qty 90, 90d supply, fill #0

## 2022-10-11 NOTE — Progress Notes (Signed)
Established Patient Office Visit   Subjective:  Patient ID: Elijah Cervantes, male    DOB: 09-20-1955  Age: 67 y.o. MRN: [id number]  Chief Complaint  Patient presents with   Medical Management of Chronic Issues    3 month follow up. Fasting. Blood sugar last week 150    HPI Encounter Diagnoses  Name Primary?   HYPERCHOLESTEROLEMIA Yes   Essential hypertension    Follow-up of elevated cholesterol with a history of elevated ASCVD risk score and hypertension.  He has been seeing Dr. Dwyane Dee for his diabetes.  Lipids are well-controlled with LDL cholesterol at 59.  Blood pressure has been elevated slightly on the 10 mg of lisinopril.  He is tolerating the medication well.  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   Review of Systems  Constitutional: Negative.   HENT: Negative.    Eyes:  Negative for blurred vision, discharge and redness.  Respiratory: Negative.    Cardiovascular: Negative.   Gastrointestinal:  Negative for abdominal pain.  Genitourinary: Negative.   Musculoskeletal: Negative.  Negative for myalgias.  Skin:  Negative for rash.  Neurological:  Negative for tingling, loss of consciousness and weakness.  Endo/Heme/Allergies:  Negative for polydipsia.     Current Outpatient Medications:    aspirin 81 MG tablet, Take 1 tablet (81 mg total) by mouth daily., Disp: 30 tablet, Rfl: 0   atorvastatin (LIPITOR) 40 MG tablet, TAKE 1 TABLET BY MOUTH ONCE A DAY, Disp: 90 tablet, Rfl: 3   glucose blood (FREESTYLE LITE) test strip, Use to check blood sugar twice a day, Disp: 100 each, Rfl: 12   glucose monitoring kit (FREESTYLE) monitoring kit, 1 each by Does not apply route as needed for other., Disp: 1 each, Rfl: 0   Lancets (FREESTYLE) lancets, Use to test blood sugar once daily, Disp: 100 each, Rfl: 3   lisinopril (ZESTRIL) 20 MG tablet, Take 1 tablet (20 mg total) by mouth daily., Disp: 90 tablet, Rfl: 3   metFORMIN (GLUCOPHAGE-XR) 500 MG 24 hr tablet, Take 3 tablets  (1,500 mg total) by mouth daily., Disp: 270 tablet, Rfl: 0   pioglitazone (ACTOS) 45 MG tablet, TAKE 1 TABLET BY MOUTH ONCE A DAY, Disp: 90 tablet, Rfl: 3   repaglinide (PRANDIN) 2 MG tablet, Take 2 tablets  by mouth 3  times daily before meals., Disp: 540 tablet, Rfl: 2   Objective:     BP (!) 140/84 (BP Location: Right Arm, Patient Position: Sitting, Cuff Size: Large)   Pulse 79   Temp 97.8 F (36.6 C) (Temporal)   Ht 5' 6"$  (1.676 m)   Wt 148 lb 9.6 oz (67.4 kg)   SpO2 98%   BMI 23.98 kg/m  BP Readings from Last 3 Encounters:  10/11/22 (!) 140/84  07/05/22 (!) 140/62  05/24/22 (!) 140/66   Wt Readings from Last 3 Encounters:  10/11/22 148 lb 9.6 oz (67.4 kg)  07/05/22 150 lb 6.4 oz (68.2 kg)  05/24/22 148 lb (67.1 kg)      Physical Exam Constitutional:      General: He is not in acute distress.    Appearance: Normal appearance. He is not ill-appearing, toxic-appearing or diaphoretic.  HENT:     Head: Normocephalic and atraumatic.     Right Ear: External ear normal.     Left Ear: External ear normal.  Eyes:     General: No scleral icterus.       Right eye: No discharge.  Left eye: No discharge.     Extraocular Movements: Extraocular movements intact.     Conjunctiva/sclera: Conjunctivae normal.  Cardiovascular:     Rate and Rhythm: Normal rate and regular rhythm.     Pulses:          Dorsalis pedis pulses are 2+ on the right side and 2+ on the left side.       Posterior tibial pulses are 2+ on the right side and 2+ on the left side.  Pulmonary:     Effort: Pulmonary effort is normal. No respiratory distress.     Breath sounds: Normal breath sounds.  Skin:    General: Skin is warm and dry.  Neurological:     Mental Status: He is alert and oriented to person, place, and time.  Psychiatric:        Mood and Affect: Mood normal.        Behavior: Behavior normal.    Diabetic Foot Exam - Simple   Simple Foot Form Diabetic Foot exam was performed with the  following findings: Yes 10/11/2022  9:55 AM  Visual Inspection See comments: Yes Sensation Testing Intact to touch and monofilament testing bilaterally: Yes Pulse Check Posterior Tibialis and Dorsalis pulse intact bilaterally: Yes Comments Feet are cavus bilaterally.  No lesions or rashes.      No results found for any visits on 10/11/22.    The 10-year ASCVD risk score (Arnett DK, et al., 2019) is: 25.4%    Assessment & Plan:   HYPERCHOLESTEROLEMIA  Essential hypertension -     Lisinopril; Take 1 tablet (20 mg total) by mouth daily.  Dispense: 90 tablet; Refill: 3    Return in about 3 months (around 01/09/2023), or Bring your blood pressure cuff with you next visit..  Continue atorvastatin 40 mg daily.  Have increased lisinopril to 20 mg.  He will bring his cuff with him next visit so we can compare.  Continue follow-up with Dr. Dwyane Dee for diabetes.  He has an appointment next week.  Libby Maw, MD

## 2022-10-11 NOTE — Patient Outreach (Signed)
  Care Coordination   In Person Provider Office Visit Note   10/11/2022 Name: Elijah Cervantes MRN: [id number] DOB: 1955-10-30  Elijah Cervantes is a 67 y.o. year old male who sees Libby Maw, MD for primary care. I engaged with Renee Rival in the providers office today.  What matters to the patients health and wellness today?  none    Goals Addressed             This Visit's Progress    COMPLETED: Care Coordination Activities-No follow up required       Care Coordination Interventions: Advised patient to Annual Wellness exam. Discussed [facility] services and support. Assessed SDOH. Advised to discuss with primary care physician if services needed in the future.        SDOH assessments and interventions completed:  Yes  SDOH Interventions Today    Flowsheet Row Most Recent Value  SDOH Interventions   Food Insecurity Interventions Intervention Not Indicated  Housing Interventions Intervention Not Indicated        Care Coordination Interventions:  Yes, provided   Follow up plan: No further intervention required.   Encounter Outcome:  Pt. Visit Completed   Jone Baseman, RN, MSN [facility] Care Management Coordinator Direct Line [phone]

## 2022-10-11 NOTE — Patient Instructions (Signed)
Visit Information  Thank you for taking time to visit with me today. Please don't hesitate to contact me if I can be of assistance to you.   Following are the goals we discussed today:   Goals Addressed             This Visit's Progress    COMPLETED: Care Coordination Activities-No follow up required       Care Coordination Interventions: Advised patient to Annual Wellness exam. Discussed [facility] services and support. Assessed SDOH. Advised to discuss with primary care physician if services needed in the future.          If you are experiencing a Mental Health or [place] or need someone to talk to, please call the Suicide and Crisis Lifeline: 988   Patient verbalizes understanding of instructions and care plan provided today and agrees to view in [place]. Active MyChart status and patient understanding of how to access instructions and care plan via MyChart confirmed with patient.     No further follow up required: decline  Jone Baseman, RN, MSN [facility] Care Management Coordinator Direct Line [phone]

## 2022-10-15 ENCOUNTER — Other Ambulatory Visit (INDEPENDENT_AMBULATORY_CARE_PROVIDER_SITE_OTHER): Payer: PPO

## 2022-10-15 DIAGNOSIS — E1165 Type 2 diabetes mellitus with hyperglycemia: Secondary | ICD-10-CM | POA: Diagnosis not present

## 2022-10-15 LAB — BASIC METABOLIC PANEL
BUN: 16 mg/dL (ref 6–23)
CO2: 29 mEq/L (ref 19–32)
Calcium: 9.8 mg/dL (ref 8.4–10.5)
Chloride: 103 mEq/L (ref 96–112)
Creatinine, Ser: 0.94 mg/dL (ref 0.40–1.50)
GFR: 83.79 mL/min (ref >60.00)
Glucose, Bld: 210 mg/dL — ABNORMAL HIGH (ref 70–99)
Potassium: 3.7 mEq/L (ref 3.5–5.1)
Sodium: 140 mEq/L (ref 135–145)

## 2022-10-15 LAB — HEMOGLOBIN A1C: Hgb A1c MFr Bld: 9 % — ABNORMAL HIGH (ref 4.6–6.5)

## 2022-10-18 ENCOUNTER — Ambulatory Visit: Payer: PPO | Admitting: Endocrinology

## 2022-10-21 ENCOUNTER — Ambulatory Visit (INDEPENDENT_AMBULATORY_CARE_PROVIDER_SITE_OTHER): Payer: PPO | Admitting: Endocrinology

## 2022-10-21 ENCOUNTER — Other Ambulatory Visit (HOSPITAL_COMMUNITY): Payer: Self-pay

## 2022-10-21 ENCOUNTER — Other Ambulatory Visit: Payer: Self-pay

## 2022-10-21 VITALS — BP 120/58 | HR 93 | Ht 66.0 in | Wt 149.2 lb

## 2022-10-21 DIAGNOSIS — I10 Essential (primary) hypertension: Secondary | ICD-10-CM

## 2022-10-21 DIAGNOSIS — E1165 Type 2 diabetes mellitus with hyperglycemia: Secondary | ICD-10-CM | POA: Diagnosis not present

## 2022-10-21 MED ORDER — EMPAGLIFLOZIN 25 MG PO TABS
25.0000 mg | ORAL_TABLET | Freq: Every day | ORAL | 2 refills | Status: DC
  Filled 2022-10-21: qty 30, 30d supply, fill #0
  Filled 2022-11-18: qty 30, 30d supply, fill #1
  Filled 2022-12-16: qty 30, 30d supply, fill #2

## 2022-10-21 MED ORDER — GLIMEPIRIDE 2 MG PO TABS
2.0000 mg | ORAL_TABLET | Freq: Every day | ORAL | 3 refills | Status: DC
  Filled 2022-10-21: qty 30, 30d supply, fill #0
  Filled 2022-11-18: qty 30, 30d supply, fill #1
  Filled 2022-12-16: qty 30, 30d supply, fill #2
  Filled 2023-01-13: qty 30, 30d supply, fill #3

## 2022-10-21 NOTE — Patient Instructions (Addendum)
Check blood sugars on waking up 2 days a week  Also check blood sugars about 2 hours after meals and do this after different meals by rotation  Recommended blood sugar levels on waking up are 90-130 and about 2 hours after meal is 130-160  Please bring your blood sugar monitor to each visit, thank you  Change Prandin to Amaryl  See dietician  With jardiance cut Lisinopril to 10

## 2022-10-21 NOTE — Progress Notes (Signed)
Patient ID: Elijah Cervantes, male   DOB: 1956-09-01, 67 y.o.   MRN: [id number]            Reason for Appointment: Type II Diabetes follow-up   History of Present Illness   Diagnosis date: 1996  Previous history:  Non-insulin hypoglycemic drugs previously used: Metformin, Actos since 2021, Prandin since 2019, Rybelsus, Jardiance  A1c range in the last few years is: 6.6-8  Recent history:     Non-insulin hypoglycemic drugs: Repaglinide 2-3 times a day metformin ER 1500 mg daily, Actos 45 mg daily    Side effects from medications: None  Current self management, blood sugar patterns and problems identified:  A1c is 9, along with 9.6  He is again rarely checking his blood sugars, likely once a week when he wakes up Although his fasting readings at home were reportedly better previously they appear to be higher in his lab glucose was up to 210 fasting  However unclear what his blood sugars are after meal he did not check them He is still eating lots of carbohydrates meals especially rice He canceled his appointment with the dietitian last month He is however doing a little better with taking his Prandin a few minutes before eating although timing can be variable He says his work involves fairly strenuous activity and does not exercise at home  Exercise: No formal exercise  Diet management: Meal times:  10pm, 8 am, 4 pm, working night shifts He has no meal plan, breakfast eggs, rice, usually eats large amounts of rice at meals.     Hypoglycemia:  none    Glucometer: Freestyle lite  Blood Glucose readings from recall 190  Previously:  PRE-MEAL Fasting Lunch Dinner Bedtime Overall  Glucose range: 149-162      Mean/median:         Dietician visit: Most recent:   none   Weight control:  Wt Readings from Last 3 Encounters:  10/21/22 149 lb 3.2 oz (67.7 kg)  10/11/22 148 lb 9.6 oz (67.4 kg)  07/05/22 150 lb 6.4 oz (68.2 kg)          Diabetes labs:  Lab  Results  Component Value Date   HGBA1C 9.0 (H) 10/15/2022   HGBA1C 9.6 (H) 06/28/2022   HGBA1C 8.0 (A) 12/28/2021   Lab Results  Component Value Date   MICROALBUR 6.6 (H) 05/24/2022   LDLCALC 59 05/31/2022   CREATININE 0.94 10/15/2022    No results found for: "FRUCTOSAMINE"   Allergies as of 10/21/2022   No Known Allergies      Medication List        Accurate as of October 21, 2022  1:25 PM. If you have any questions, ask your nurse or doctor.          aspirin 81 MG tablet Take 1 tablet (81 mg total) by mouth daily.   atorvastatin 40 MG tablet Commonly known as: LIPITOR TAKE 1 TABLET BY MOUTH ONCE A DAY   freestyle lancets Use to test blood sugar once daily   FREESTYLE LITE test strip Generic drug: glucose blood Use to check blood sugar twice a day   glucose monitoring kit monitoring kit 1 each by Does not apply route as needed for other.   lisinopril 20 MG tablet Commonly known as: ZESTRIL Take 1 tablet (20 mg total) by mouth daily.   metFORMIN 500 MG 24 hr tablet Commonly known as: GLUCOPHAGE-XR Take 3 tablets (1,500 mg total) by mouth daily.  pioglitazone 45 MG tablet Commonly known as: ACTOS TAKE 1 TABLET BY MOUTH ONCE A DAY   repaglinide 2 MG tablet Commonly known as: PRANDIN Take 2 tablets  by mouth 3  times daily before meals.        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   FATTY LIVER DISEASE 07/18/2008   GERD 07/26/2009   HSV-1 infection    HYPERCHOLESTEROLEMIA 10/31/2010   HYPERTENSION 03/31/2007   NASH (nonalcoholic steatohepatitis)    Personal history of colonic adenoma 02/10/2013    Past Surgical History:  Procedure Laterality Date   COLONOSCOPY     2014 1 adenoma 2019 none   hernia repair     Stress Cardiolite  11/22/2003     Family History  Problem Relation Age of Onset   Diabetes Mother    Diabetes Brother    Cancer Neg Hx    Colon cancer Neg Hx    Esophageal cancer Neg Hx    Stomach cancer Neg Hx    Rectal cancer Neg Hx    Colon polyps Neg Hx     Social History:  reports that he has never smoked. He has never used smokeless tobacco. He reports current alcohol use. He reports that he does not use drugs.  Review of Systems:  Last diabetic eye exam date 11/21  Last urine microalbumin date: 9/23  Last foot exam date: 10/22  Symptoms of neuropathy: None  Hypertension:   ACE/ARB medication: Lisinopril 20 mg daily now Recently this was increased by his PCP He says his blood pressure previously had been only around [id number] average systolic at home His wife is a nurse  BP Readings from Last 3 Encounters:  10/21/22 (!) 120/58  10/11/22 (!) 140/84  07/05/22 (!) 140/62    Lipid management: Taking atorvastatin 10 mg from his PCP    Lab Results  Component Value Date   CHOL 132 05/31/2022   CHOL 126 11/09/2021   CHOL 103 11/14/2020   Lab Results  Component Value Date   HDL 59.50 05/31/2022   HDL 62.60 11/09/2021   HDL 54.10 11/14/2020   Lab Results  Component Value Date   LDLCALC 59 05/31/2022   LDLCALC 51 11/09/2021   LDLCALC 38 11/14/2020   Lab Results  Component Value Date   TRIG 68.0 05/31/2022   TRIG 62.0 11/09/2021   TRIG 53.0 11/14/2020   Lab Results  Component Value Date   CHOLHDL 2 05/31/2022   CHOLHDL 2 11/09/2021   CHOLHDL 2 11/14/2020   Lab Results  Component Value Date   LDLDIRECT 51.0 02/28/2020   LDLDIRECT 48.0 10/28/2019   LDLDIRECT 38.0 04/29/2019       Examination:   BP (!) 120/58 (BP Location: Left Arm, Patient Position: Sitting, Cuff Size: Normal)   Pulse 93   Ht 5' 6"$  (1.676 m)   Wt 149 lb 3.2 oz (67.7 kg)   SpO2 99%   BMI 24.08 kg/m   Body mass index is 24.08 kg/m.    ASSESSMENT/ PLAN:    Diabetes type 2 non-insulin-dependent:    Current regimen: Prandin, metformin, Actos  See history of present illness for detailed discussion of current diabetes management, blood sugar patterns and problems identified  A1c is 9% which is still about his highest in several years Discussed that he likely has some  insulin deficiency because of not responding to his 3 drug treatment Also now fasting readings are higher than before  He is still unsure whether he can afford his medications when he gets in the donut hole, previously had benefited from Rybelsus and Jardiance Also can do better with cutting back on carbohydrates at meals  Recommendations:  He will go back to Jardiance 25 mg daily, likely can afford this for longer better time than Rybelsus this year With this he will cut back his lisinopril back to 10 mg, message to PCP sent Follow blood pressure closely at home Make sure he is getting plenty of fluids Start taking 2 mg twice daily instead of Prandin; he can take this with his first meal of the day and last meal Avoid skipping meals during his waking hours Consistent monitoring at home and bring monitor for download on each visit Discussed targets for fasting and after meal readings 2 hours later Start cutting back on portions of rice and other carbohydrate rich meals  Call if blood sugars are unusually high or low Follow-up in 3 months  There are no Patient Instructions on file for this visit.   Elayne Snare 10/21/2022, 1:25 PM   Total visit time including counseling = 30 minutes

## 2022-10-23 ENCOUNTER — Other Ambulatory Visit (HOSPITAL_COMMUNITY): Payer: Self-pay

## 2022-10-24 ENCOUNTER — Encounter: Payer: Self-pay | Admitting: Family Medicine

## 2022-10-31 ENCOUNTER — Telehealth: Payer: Self-pay | Admitting: Family Medicine

## 2022-10-31 NOTE — Telephone Encounter (Signed)
Called patient to schedule Medicare Annual Wellness Visit (AWV). Left message for patient to call back and schedule Medicare Annual Wellness Visit (AWV).  Last date of AWV:  DUE 09/02/2022 AWVI PER PALMETTO  Please schedule an appointment at any time with [facility].  If any questions, please contact me at [phone].  Thank you ,  Barkley Boards AWV direct phone # [phone]

## 2022-11-18 ENCOUNTER — Other Ambulatory Visit (HOSPITAL_COMMUNITY): Payer: Self-pay

## 2022-11-18 ENCOUNTER — Other Ambulatory Visit: Payer: Self-pay | Admitting: Endocrinology

## 2022-11-18 ENCOUNTER — Other Ambulatory Visit: Payer: Self-pay

## 2022-11-18 MED ORDER — PIOGLITAZONE HCL 45 MG PO TABS
45.0000 mg | ORAL_TABLET | Freq: Every day | ORAL | 3 refills | Status: DC
Start: 2022-11-18 — End: 2023-10-17
  Filled 2022-11-18: qty 90, 90d supply, fill #0
  Filled 2023-02-10: qty 90, 90d supply, fill #1
  Filled 2023-05-13: qty 90, 90d supply, fill #2
  Filled 2023-07-22: qty 90, 90d supply, fill #3

## 2022-11-19 ENCOUNTER — Other Ambulatory Visit (HOSPITAL_COMMUNITY): Payer: Self-pay

## 2022-11-19 MED ORDER — METFORMIN HCL ER 500 MG PO TB24
1500.0000 mg | ORAL_TABLET | Freq: Every day | ORAL | 0 refills | Status: DC
  Filled 2022-11-19: qty 270, 90d supply, fill #0

## 2022-12-16 ENCOUNTER — Other Ambulatory Visit (HOSPITAL_COMMUNITY): Payer: Self-pay

## 2022-12-30 ENCOUNTER — Telehealth: Payer: Self-pay | Admitting: Family Medicine

## 2022-12-30 NOTE — Telephone Encounter (Signed)
Called patient to schedule Medicare Annual Wellness Visit (AWV). Left message for patient to call back and schedule Medicare Annual Wellness Visit (AWV).  Last date of AWV:  DUE 09/02/2022 AWVI PER PALMETTO  Please schedule an appointment at any time with [facility].  If any questions, please contact me at [phone].  Thank you ,  Meara Wiechman [facility] AWV direct phone # [phone] 

## 2023-01-02 ENCOUNTER — Telehealth: Payer: Self-pay | Admitting: Family Medicine

## 2023-01-02 NOTE — Telephone Encounter (Signed)
Contacted Elijah Cervantes to schedule their annual wellness visit. Appointment made for 01/13/23.  [name] AWV direct phone # [phone]

## 2023-01-13 ENCOUNTER — Other Ambulatory Visit (HOSPITAL_COMMUNITY): Payer: Self-pay

## 2023-01-13 ENCOUNTER — Other Ambulatory Visit: Payer: Self-pay | Admitting: Endocrinology

## 2023-01-13 ENCOUNTER — Ambulatory Visit (INDEPENDENT_AMBULATORY_CARE_PROVIDER_SITE_OTHER): Payer: PPO | Admitting: Family Medicine

## 2023-01-13 ENCOUNTER — Encounter: Payer: Self-pay | Admitting: Family Medicine

## 2023-01-13 ENCOUNTER — Ambulatory Visit: Payer: PPO

## 2023-01-13 ENCOUNTER — Ambulatory Visit: Payer: PPO | Admitting: "Endocrinology

## 2023-01-13 ENCOUNTER — Other Ambulatory Visit: Payer: Self-pay

## 2023-01-13 VITALS — BP 132/68 | HR 74 | Temp 98.3°F | Ht 66.0 in | Wt 141.0 lb

## 2023-01-13 DIAGNOSIS — I10 Essential (primary) hypertension: Secondary | ICD-10-CM

## 2023-01-13 DIAGNOSIS — E7800 Pure hypercholesterolemia, unspecified: Secondary | ICD-10-CM | POA: Diagnosis not present

## 2023-01-13 LAB — LIPID PANEL
Cholesterol: 123 mg/dL (ref 0–200)
HDL: 51.4 mg/dL (ref >39.00)
LDL Cholesterol: 61 mg/dL (ref 0–99)
NonHDL: 72
Total CHOL/HDL Ratio: 2
Triglycerides: 56 mg/dL (ref 0.0–149.0)
VLDL: 11.2 mg/dL (ref 0.0–40.0)

## 2023-01-13 LAB — COMPREHENSIVE METABOLIC PANEL
ALT: 25 U/L (ref 0–53)
AST: 22 U/L (ref 0–37)
Albumin: 4.1 g/dL (ref 3.5–5.2)
Alkaline Phosphatase: 68 U/L (ref 39–117)
BUN: 22 mg/dL (ref 6–23)
CO2: 26 mEq/L (ref 19–32)
Calcium: 9.2 mg/dL (ref 8.4–10.5)
Chloride: 107 mEq/L (ref 96–112)
Creatinine, Ser: 0.89 mg/dL (ref 0.40–1.50)
GFR: 88.42 mL/min (ref >60.00)
Glucose, Bld: 174 mg/dL — ABNORMAL HIGH (ref 70–99)
Potassium: 4 mEq/L (ref 3.5–5.1)
Sodium: 142 mEq/L (ref 135–145)
Total Bilirubin: 0.6 mg/dL (ref 0.2–1.2)
Total Protein: 6.6 g/dL (ref 6.0–8.3)

## 2023-01-13 LAB — CBC
HCT: 43.7 % (ref 39.0–52.0)
Hemoglobin: 14.4 g/dL (ref 13.0–17.0)
MCHC: 33 g/dL (ref 30.0–36.0)
MCV: 97.9 fl (ref 78.0–100.0)
Platelets: 197 10*3/uL (ref 150.0–400.0)
RBC: 4.46 Mil/uL (ref 4.22–5.81)
RDW: 12.9 % (ref 11.5–15.5)
WBC: 4.8 10*3/uL (ref 4.0–10.5)

## 2023-01-13 MED ORDER — LISINOPRIL 20 MG PO TABS
20.0000 mg | ORAL_TABLET | Freq: Every day | ORAL | 3 refills | Status: DC
  Filled 2023-01-13: qty 90, 90d supply, fill #0
  Filled 2023-01-13 – 2023-04-28 (×2): qty 90, 90d supply, fill #1

## 2023-01-13 MED ORDER — EMPAGLIFLOZIN 25 MG PO TABS
25.0000 mg | ORAL_TABLET | Freq: Every day | ORAL | 2 refills | Status: DC
  Filled 2023-01-13: qty 30, 30d supply, fill #0

## 2023-01-13 NOTE — Progress Notes (Signed)
Established Patient Office Visit   Subjective:  Patient ID: Elijah Cervantes, male    DOB: 05/31/1956  Age: 67 y.o. MRN: [id number]  Chief Complaint  Patient presents with   Medical Management of Chronic Issues    3 month follow up on BP, no concerns.Patient fasting.     HPI Encounter Diagnoses  Name Primary?   Pure hypercholesterolemia Yes   Essential hypertension    Blood pressure well-controlled on the 20 mg dose of lisinopril.  Continues 40 mg of atorvastatin for elevated cholesterol.  Continues follow-up with endocrinology for diabetes.   Review of Systems  Constitutional: Negative.   HENT: Negative.    Eyes:  Negative for blurred vision, discharge and redness.  Respiratory: Negative.    Cardiovascular: Negative.   Gastrointestinal:  Negative for abdominal pain.  Genitourinary: Negative.   Musculoskeletal: Negative.  Negative for myalgias.  Skin:  Negative for rash.  Neurological:  Negative for tingling, loss of consciousness and weakness.  Endo/Heme/Allergies:  Negative for polydipsia.      01/13/2023    8:23 AM 10/11/2022    9:51 AM 05/24/2022    8:07 AM  Depression screen PHQ 2/9  Decreased Interest 0 0 0  Down, Depressed, Hopeless 0 0 0  PHQ - 2 Score 0 0 0      Current Outpatient Medications:    aspirin 81 MG tablet, Take 1 tablet (81 mg total) by mouth daily., Disp: 30 tablet, Rfl: 0   atorvastatin (LIPITOR) 40 MG tablet, TAKE 1 TABLET BY MOUTH ONCE A DAY, Disp: 90 tablet, Rfl: 3   empagliflozin (JARDIANCE) 25 MG TABS tablet, Take 1 tablet (25 mg total) by mouth daily before breakfast., Disp: 30 tablet, Rfl: 2   glimepiride (AMARYL) 2 MG tablet, Take 1 tablet (2 mg total) by mouth daily before breakfast., Disp: 30 tablet, Rfl: 3   glucose blood (FREESTYLE LITE) test strip, Use to check blood sugar twice a day, Disp: 100 each, Rfl: 12   glucose monitoring kit (FREESTYLE) monitoring kit, 1 each by Does not apply route as needed for other., Disp: 1 each, Rfl:  0   Lancets (FREESTYLE) lancets, Use to test blood sugar once daily, Disp: 100 each, Rfl: 3   metFORMIN (GLUCOPHAGE-XR) 500 MG 24 hr tablet, Take 3 tablets (1,500 mg total) by mouth daily., Disp: 270 tablet, Rfl: 0   pioglitazone (ACTOS) 45 MG tablet, Take 1 tablet (45 mg total) by mouth daily., Disp: 90 tablet, Rfl: 3   lisinopril (ZESTRIL) 20 MG tablet, Take 1 tablet (20 mg total) by mouth daily., Disp: 90 tablet, Rfl: 3   Objective:     BP 132/68 (BP Location: Right Arm, Patient Position: Sitting, Cuff Size: Normal)   Pulse 74   Temp 98.3 F (36.8 C) (Temporal)   Ht 5\' 6"  (1.676 m)   Wt 141 lb (64 kg)   SpO2 98%   BMI 22.76 kg/m    Physical Exam Constitutional:      General: He is not in acute distress.    Appearance: Normal appearance. He is not ill-appearing, toxic-appearing or diaphoretic.  HENT:     Head: Normocephalic and atraumatic.     Right Ear: External ear normal.     Left Ear: External ear normal.  Eyes:     General: No scleral icterus.       Right eye: No discharge.        Left eye: No discharge.     Extraocular Movements: Extraocular movements intact.  Conjunctiva/sclera: Conjunctivae normal.  Cardiovascular:     Rate and Rhythm: Normal rate and regular rhythm.  Pulmonary:     Effort: Pulmonary effort is normal. No respiratory distress.     Breath sounds: Normal breath sounds.  Skin:    General: Skin is warm and dry.  Neurological:     Mental Status: He is alert and oriented to person, place, and time.  Psychiatric:        Mood and Affect: Mood normal.        Behavior: Behavior normal.      No results found for any visits on 01/13/23.    The 10-year ASCVD risk score (Arnett DK, et al., 2019) is: 23.2%    Assessment & Plan:   Pure hypercholesterolemia -     Comprehensive metabolic panel -     Lipid panel  Essential hypertension -     Lisinopril; Take 1 tablet (20 mg total) by mouth daily.  Dispense: 90 tablet; Refill: 3 -     CBC -      Comprehensive metabolic panel    Return in about 6 months (around 07/16/2023).    Mliss Sax, MD

## 2023-01-14 ENCOUNTER — Other Ambulatory Visit: Payer: Self-pay

## 2023-01-24 ENCOUNTER — Other Ambulatory Visit: Payer: Self-pay | Admitting: Family Medicine

## 2023-01-24 ENCOUNTER — Other Ambulatory Visit (HOSPITAL_COMMUNITY): Payer: Self-pay

## 2023-01-24 ENCOUNTER — Other Ambulatory Visit: Payer: Self-pay

## 2023-01-24 DIAGNOSIS — E7800 Pure hypercholesterolemia, unspecified: Secondary | ICD-10-CM

## 2023-01-24 MED ORDER — ATORVASTATIN CALCIUM 40 MG PO TABS
40.0000 mg | ORAL_TABLET | Freq: Every day | ORAL | 3 refills | Status: DC
Start: 2023-01-24 — End: 2023-07-21
  Filled 2023-01-24: qty 90, 90d supply, fill #0
  Filled 2023-04-28: qty 90, 90d supply, fill #1

## 2023-02-03 ENCOUNTER — Other Ambulatory Visit (HOSPITAL_COMMUNITY): Payer: Self-pay

## 2023-02-03 ENCOUNTER — Ambulatory Visit (INDEPENDENT_AMBULATORY_CARE_PROVIDER_SITE_OTHER): Payer: PPO | Admitting: "Endocrinology

## 2023-02-03 ENCOUNTER — Encounter: Payer: Self-pay | Admitting: "Endocrinology

## 2023-02-03 VITALS — BP 138/70 | HR 70 | Ht 66.0 in | Wt 138.6 lb

## 2023-02-03 DIAGNOSIS — E1165 Type 2 diabetes mellitus with hyperglycemia: Secondary | ICD-10-CM | POA: Diagnosis not present

## 2023-02-03 DIAGNOSIS — Z7984 Long term (current) use of oral hypoglycemic drugs: Secondary | ICD-10-CM | POA: Diagnosis not present

## 2023-02-03 DIAGNOSIS — E7800 Pure hypercholesterolemia, unspecified: Secondary | ICD-10-CM | POA: Diagnosis not present

## 2023-02-03 LAB — POCT GLYCOSYLATED HEMOGLOBIN (HGB A1C): Hemoglobin A1C: 7.6 % — AB (ref 4.0–5.6)

## 2023-02-03 MED ORDER — EMPAGLIFLOZIN 25 MG PO TABS
25.0000 mg | ORAL_TABLET | Freq: Every day | ORAL | 1 refills | Status: DC
  Filled 2023-02-03 – 2023-02-10 (×2): qty 90, 90d supply, fill #0
  Filled 2023-05-13 – 2023-05-19 (×2): qty 90, 90d supply, fill #1
  Filled 2023-05-21: qty 30, 30d supply, fill #1
  Filled 2023-07-14 – 2023-07-15 (×3): qty 90, 90d supply, fill #1
  Filled 2023-07-15: qty 30, 30d supply, fill #1

## 2023-02-03 MED ORDER — GLIMEPIRIDE 2 MG PO TABS
2.0000 mg | ORAL_TABLET | Freq: Every day | ORAL | 1 refills | Status: DC
  Filled 2023-02-03 – 2023-02-10 (×2): qty 90, 90d supply, fill #0

## 2023-02-03 NOTE — Patient Instructions (Signed)

## 2023-02-03 NOTE — Progress Notes (Signed)
Outpatient Endocrinology Note Elijah [place], MD  02/03/23   Elijah Cervantes May 19, 1956 [id number]  Referring Provider: Mliss Sax,* Primary Care Provider: Mliss Sax, MD Reason for consultation: Subjective   Assessment & Plan  Diagnoses and all orders for this visit:  Uncontrolled type 2 diabetes mellitus with hyperglycemia, without long-term current use of insulin (HCC) -     POCT glycosylated hemoglobin (Hb A1C)  Long term (current) use of oral hypoglycemic drugs  Pure hypercholesterolemia  Other orders -     glimepiride (AMARYL) 2 MG tablet; Take 1 tablet (2 mg total) by mouth daily before breakfast. -     empagliflozin (JARDIANCE) 25 MG TABS tablet; Take 1 tablet (25 mg total) by mouth daily before breakfast.    Diabetes complicated by hyperglycemia Hba1c goal less than 9.0, current Hba1c is 7.6. Will recommend for the following change of medications to: metformin ER 2000 mg daily, Actos 45 mg daily, jardiance 25 mg qd, amaryl 2 mg qd   No known contraindications to any of above medications Explained effects of Amaryl, skip if sick/low BG Explained if develops pedal edema, to discuss/stop actos-risk of CHF, no bladder cancer history in self/family  Counseled against diet drink Encouraged checking BG'  Hyperlipidemia -Last LDL at goal: 21 -on statin atorvastatin mg QD -Follow low fat diet and exercise   -Blood pressure goal <140/90 - Microalbumin/creatinine at goal < 30 -on ACE/ARB lisinopril 20 mg qd -diet changes including salt restriction -limit eating outside -counseled BP targets per standards of diabetes care -Uncontrolled blood pressure can lead to retinopathy, nephropathy and cardiovascular and atherosclerotic heart disease  Reviewed and counseled on: -A1C target -Blood sugar targets -Complications of uncontrolled diabetes  -Checking blood sugar before meals and bedtime and bring log next visit -All medications with  mechanism of action and side effects -Hypoglycemia management: rule of 15's, Glucagon Emergency Kit and medical alert ID -low-carb low-fat plate-method diet -At least 20 minutes of physical activity per day -Annual dilated retinal eye exam and foot exam -compliance and follow up needs -follow up as scheduled or earlier if problem gets worse  Call if blood sugar is less than 70 or consistently above 250    Take a 15 gm snack of carbohydrate at bedtime before you go to sleep if your blood sugar is less than 100.    If you are going to fast after midnight for a test or procedure, ask your physician for instructions on how to reduce/decrease your insulin dose.    Call if blood sugar is less than 70 or consistently above 250  -Treating a low sugar by rule of 15  (15 gms of sugar every 15 min until sugar is more than 70) If you feel your sugar is low, test your sugar to be sure If your sugar is low (less than 70), then take 15 grams of a fast acting Carbohydrate (3-4 glucose tablets or glucose gel or 4 ounces of juice or regular soda) Recheck your sugar 15 min after treating low to make sure it is more than 70 If sugar is still less than 70, treat again with 15 grams of carbohydrate          Don't drive the hour of hypoglycemia  If unconscious/unable to eat or drink by mouth, use glucagon injection or nasal spray baqsimi and call 911. Can repeat again in 15 min if still unconscious.  Return in about 3 months (around 05/06/2023).   I have reviewed  current medications, nurse's notes, allergies, vital signs, past medical and surgical history, family medical history, and social history for this encounter. Counseled patient on symptoms, examination findings, lab findings, imaging results, treatment decisions and monitoring and prognosis. The patient understood the recommendations and agrees with the treatment plan. All questions regarding treatment plan were fully answered.  Elijah [place], MD   02/03/23    History of Present Illness Elijah Cervantes is a 67 y.o. year old male who presents for evaluation of Type 2 diabetes mellitus.  [name] was first diagnosed in 1996.   Diabetes education +  Home diabetes regimen: metformin ER 1500 mg daily, Actos 45 mg daily, jardiance 25 mg qd, amaryl 2 mg qd   Previous history:  Non-insulin hypoglycemic drugs previously used: Metformin, Actos since 2021, Prandin since 2019, Rybelsus, Repaglinide 2-3 times a day   range in the last few years is: 6.6-8  COMPLICATIONS -  MI/Stroke -  retinopathy, last eye exam 2023 -  neuropathy -  nephropathy  BLOOD SUGAR DATA Doesn't check BG  Physical Exam  BP 138/70   Pulse 70   Ht 5\' 6"  (1.676 m)   Wt 138 lb 9.6 oz (62.9 kg)   SpO2 97%   BMI 22.37 kg/m    Constitutional: well developed, well nourished Head: normocephalic, atraumatic Eyes: sclera anicteric, no redness Neck: supple Lungs: normal respiratory effort Neurology: alert and oriented Skin: dry, no appreciable rashes Musculoskeletal: no appreciable defects Psychiatric: normal mood and affect Diabetic Foot Exam - Simple   No data filed      Current Medications Patient's Medications  New Prescriptions   No medications on file  Previous Medications   ASPIRIN 81 MG TABLET    Take 1 tablet (81 mg total) by mouth daily.   ATORVASTATIN (LIPITOR) 40 MG TABLET    Take 1 tablet (40 mg total) by mouth daily.   GLUCOSE BLOOD (FREESTYLE LITE) TEST STRIP    Use to check blood sugar twice a day   GLUCOSE MONITORING KIT (FREESTYLE) MONITORING KIT    1 each by Does not apply route as needed for other.   LANCETS (FREESTYLE) LANCETS    Use to test blood sugar once daily   LISINOPRIL (ZESTRIL) 20 MG TABLET    Take 1 tablet (20 mg total) by mouth daily.   METFORMIN (GLUCOPHAGE-XR) 500 MG 24 HR TABLET    Take 3 tablets (1,500 mg total) by mouth daily.   PIOGLITAZONE (ACTOS) 45 MG TABLET    Take 1 tablet (45 mg total) by mouth  daily.  Modified Medications   Modified Medication Previous Medication   EMPAGLIFLOZIN (JARDIANCE) 25 MG TABS TABLET empagliflozin (JARDIANCE) 25 MG TABS tablet      Take 1 tablet (25 mg total) by mouth daily before breakfast.    Take 1 tablet (25 mg total) by mouth daily before breakfast.   GLIMEPIRIDE (AMARYL) 2 MG TABLET glimepiride (AMARYL) 2 MG tablet      Take 1 tablet (2 mg total) by mouth daily before breakfast.    Take 1 tablet (2 mg total) by mouth daily before breakfast.  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  Social History Social History   Socioeconomic History   Marital status: Married    Spouse name: Not on file   Number of children: Not on file   Years of education: Not on file   Highest education level: Not on file  Occupational History   Occupation: Unemployed  Tobacco Use   Smoking status: Never   Smokeless tobacco: Never  Substance and Sexual Activity   Alcohol use: Yes    Comment: rarely   Drug use: No   Sexual activity: Not on file  Other Topics Concern   Not on file  Social History Narrative   Not on file   Social Determinants of Health   Financial Resource Strain: Not on file  Food Insecurity: No Food Insecurity  (10/11/2022)   Hunger Vital Sign    Worried About Running Out of Food in the Last Year: Never true    Ran Out of Food in the Last Year: Never true  Transportation Needs: Not on file  Physical Activity: Not on file  Stress: Not on file  Social Connections: Not on file  Intimate Partner Violence: Not on file    Lab Results  Component Value Date   HGBA1C 7.6 (A) 02/03/2023   HGBA1C 9.0 (H) 10/15/2022   HGBA1C 9.6 (H) 06/28/2022   Lab Results  Component Value Date   CHOL 123 01/13/2023   Lab Results  Component Value Date   HDL 51.40 01/13/2023   Lab Results  Component Value Date   LDLCALC 61 01/13/2023   Lab Results  Component Value Date   TRIG 56.0 01/13/2023   Lab Results  Component Value Date   CHOLHDL 2 01/13/2023   Lab Results  Component Value Date   CREATININE 0.89 01/13/2023   Lab Results  Component Value Date   GFR 88.42 01/13/2023   Lab Results  Component Value Date   MICROALBUR 6.6 (H) 05/24/2022      Component Value Date/Time   NA 142 01/13/2023 0925   K 4.0 01/13/2023 0925   CL 107 01/13/2023 0925   CO2 26 01/13/2023 0925   GLUCOSE 174 (H) 01/13/2023 0925   BUN 22 01/13/2023 0925   CREATININE 0.89 01/13/2023 0925   CALCIUM 9.2 01/13/2023 0925   PROT 6.6 01/13/2023 0925   ALBUMIN 4.1 01/13/2023 0925   AST 22 01/13/2023 0925   ALT 25 01/13/2023 0925   ALKPHOS 68 01/13/2023 0925   BILITOT 0.6 01/13/2023 0925   GFRNONAA 77.81 07/30/2010 1036   GFRAA 114 07/12/2008 0951      Latest Ref Rng & Units 01/13/2023    9:25 AM 10/15/2022    8:38 AM 06/28/2022    8:38 AM  BMP  Glucose 70 - 99 mg/dL 161  096  045   BUN 6 - 23 mg/dL 22  16    Creatinine 4.09 - 1.50 mg/dL 8.11  9.14    Sodium 782 - 145 mEq/L 142  140    Potassium 3.5 - 5.1 mEq/L 4.0  3.7    Chloride 96 - 112  mEq/L 107  103    CO2 19 - 32 mEq/L 26  29    Calcium 8.4 - 10.5 mg/dL 9.2  9.8         Component Value Date/Time   WBC 4.8 01/13/2023 0925   RBC 4.46 01/13/2023 0925    HGB 14.4 01/13/2023 0925   HCT 43.7 01/13/2023 0925   PLT 197.0 01/13/2023 0925   MCV 97.9 01/13/2023 0925   MCHC 33.0 01/13/2023 0925   RDW 12.9 01/13/2023 0925   LYMPHSABS 0.9 11/09/2021 0945   MONOABS 0.3 11/09/2021 0945   EOSABS 0.1 11/09/2021 0945   BASOSABS 0.0 11/09/2021 0945     Parts of this note may have been dictated using voice recognition software. There may be variances in spelling and vocabulary which are unintentional. Not all errors are proofread. Please notify the Thereasa Parkin if any discrepancies are noted or if the meaning of any statement is not clear.

## 2023-02-10 ENCOUNTER — Other Ambulatory Visit (HOSPITAL_COMMUNITY): Payer: Self-pay

## 2023-02-11 ENCOUNTER — Other Ambulatory Visit: Payer: Self-pay | Admitting: Endocrinology

## 2023-02-11 ENCOUNTER — Other Ambulatory Visit: Payer: Self-pay

## 2023-02-11 ENCOUNTER — Other Ambulatory Visit (HOSPITAL_COMMUNITY): Payer: Self-pay

## 2023-02-12 ENCOUNTER — Other Ambulatory Visit (HOSPITAL_COMMUNITY): Payer: Self-pay

## 2023-02-12 ENCOUNTER — Other Ambulatory Visit: Payer: Self-pay

## 2023-02-12 MED ORDER — METFORMIN HCL ER 500 MG PO TB24
1500.0000 mg | ORAL_TABLET | Freq: Every day | ORAL | 1 refills | Status: DC
  Filled 2023-02-12: qty 270, 90d supply, fill #0

## 2023-04-28 ENCOUNTER — Other Ambulatory Visit: Payer: Self-pay

## 2023-04-28 ENCOUNTER — Other Ambulatory Visit (HOSPITAL_COMMUNITY): Payer: Self-pay

## 2023-04-28 ENCOUNTER — Ambulatory Visit: Payer: PPO | Admitting: "Endocrinology

## 2023-04-28 ENCOUNTER — Encounter: Payer: Self-pay | Admitting: "Endocrinology

## 2023-04-28 VITALS — BP 130/55 | HR 76 | Ht 66.0 in | Wt 137.0 lb

## 2023-04-28 DIAGNOSIS — Z7984 Long term (current) use of oral hypoglycemic drugs: Secondary | ICD-10-CM | POA: Diagnosis not present

## 2023-04-28 DIAGNOSIS — E7800 Pure hypercholesterolemia, unspecified: Secondary | ICD-10-CM

## 2023-04-28 DIAGNOSIS — E1165 Type 2 diabetes mellitus with hyperglycemia: Secondary | ICD-10-CM | POA: Diagnosis not present

## 2023-04-28 LAB — POCT GLYCOSYLATED HEMOGLOBIN (HGB A1C): Hemoglobin A1C: 7.1 % — AB (ref 4.0–5.6)

## 2023-04-28 MED ORDER — METFORMIN HCL ER 500 MG PO TB24
2000.0000 mg | ORAL_TABLET | Freq: Every day | ORAL | 5 refills | Status: DC
  Filled 2023-04-28: qty 120, 30d supply, fill #0
  Filled 2023-05-29: qty 120, 30d supply, fill #1
  Filled 2023-06-23: qty 120, 30d supply, fill #2

## 2023-04-28 MED ORDER — GLIMEPIRIDE 2 MG PO TABS
2.0000 mg | ORAL_TABLET | Freq: Every day | ORAL | 1 refills | Status: DC
  Filled 2023-04-28: qty 90, 90d supply, fill #0
  Filled 2023-07-22: qty 90, 90d supply, fill #1

## 2023-04-28 NOTE — Patient Instructions (Signed)

## 2023-04-28 NOTE — Progress Notes (Signed)
Outpatient Endocrinology Note Elijah Amaya, MD  04/28/23   Elijah Cervantes October 29, 1955 [id number]  Referring Provider: Mliss Cervantes,* Primary Care Provider: Mliss Sax, MD Reason for consultation: Subjective   Assessment & Plan  Diagnoses and all orders for this visit:  Uncontrolled type 2 diabetes mellitus with hyperglycemia, without long-term current use of insulin (HCC) -     POCT glycosylated hemoglobin (Hb A1C)  Long term (current) use of oral hypoglycemic drugs  Pure hypercholesterolemia  Other orders -     metFORMIN (GLUCOPHAGE-XR) 500 MG 24 hr tablet; Take 4 tablets (2,000 mg total) by mouth daily. -     glimepiride (AMARYL) 2 MG tablet; Take 1 tablet (2 mg total) by mouth daily before breakfast.    Diabetes complicated by hyperglycemia Hba1c goal less than 9.0, current Hba1c is 7.1. Will recommend for the following change of medications to: metformin ER 2000 mg daily, Actos 45 mg daily, jardiance 25 mg qd, amaryl 2 mg qd  Check BG alternate times of day   No known contraindications/side effects to any of above medications Explained effects of Amaryl, skip if sick/low BG Previously,  explained if develops pedal edema, to discuss/stop actos-risk of CHF, no bladder cancer history in self/family  Counseled against diet drink  Hyperlipidemia -Last LDL at goal: 61 -on statin atorvastatin 40 mg QD -Follow low fat diet and exercise   -Blood pressure goal <140/90 - Microalbumin/creatinine at goal < 30 -on ACE/ARB lisinopril 20 mg qd -diet changes including salt restriction -limit eating outside -counseled BP targets per standards of diabetes care -Uncontrolled blood pressure can lead to retinopathy, nephropathy and cardiovascular and atherosclerotic heart disease  Reviewed and counseled on: -A1C target -Blood sugar targets -Complications of uncontrolled diabetes  -Checking blood sugar before meals and bedtime and bring log next  visit -All medications with mechanism of action and side effects -Hypoglycemia management: rule of 15's, Glucagon Emergency Kit and medical alert ID -low-carb low-fat plate-method diet -At least 20 minutes of physical activity per day -Annual dilated retinal eye exam and foot exam -compliance and follow up needs -follow up as scheduled or earlier if problem gets worse  Call if blood sugar is less than 70 or consistently above 250    Take a 15 gm snack of carbohydrate at bedtime before you go to sleep if your blood sugar is less than 100.    If you are going to fast after midnight for a test or procedure, ask your physician for instructions on how to reduce/decrease your insulin dose.    Call if blood sugar is less than 70 or consistently above 250  -Treating a low sugar by rule of 15  (15 gms of sugar every 15 min until sugar is more than 70) If you feel your sugar is low, test your sugar to be sure If your sugar is low (less than 70), then take 15 grams of a fast acting Carbohydrate (3-4 glucose tablets or glucose gel or 4 ounces of juice or regular soda) Recheck your sugar 15 min after treating low to make sure it is more than 70 If sugar is still less than 70, treat again with 15 grams of carbohydrate          Don't drive the hour of hypoglycemia  If unconscious/unable to eat or drink by mouth, use glucagon injection or nasal spray baqsimi and call 911. Can repeat again in 15 min if still unconscious.  Return in about 4 months (  around 08/28/2023).   I have reviewed current medications, nurse's notes, allergies, vital signs, past medical and surgical history, family medical history, and social history for this encounter. Counseled patient on symptoms, examination findings, lab findings, imaging results, treatment decisions and monitoring and prognosis. The patient understood the recommendations and agrees with the treatment plan. All questions regarding treatment plan were fully  answered.  Elijah Olcott, MD  04/28/23    History of Present Illness Elijah Cervantes is a 67 y.o. year old male who presents for evaluation of Type 2 diabetes mellitus.  Elijah Cervantes was first diagnosed in 1996.   Diabetes education +  Home diabetes regimen: metformin ER 1500 mg daily, Actos 45 mg daily, jardiance 25 mg qd, amaryl 2 mg qd   Previous history:  Non-insulin hypoglycemic drugs previously used: Metformin, Actos since 2021, Prandin since 2019, Rybelsus, Repaglinide 2-3 times a day   range in the last few years is: 6.6-8  COMPLICATIONS -  MI/Stroke -  retinopathy, last eye exam 2023 -  neuropathy -  nephropathy  BLOOD SUGAR DATA Did not bring meter Reports every day checking 80-150 fasting   Physical Exam  BP (!) 130/55   Pulse 76   Ht 5\' 6"  (1.676 m)   Wt 137 lb (62.1 kg)   SpO2 97%   BMI 22.11 kg/m    Constitutional: well developed, well nourished Head: normocephalic, atraumatic Eyes: sclera anicteric, no redness Neck: supple Lungs: normal respiratory effort Neurology: alert and oriented Skin: dry, no appreciable rashes Musculoskeletal: no appreciable defects Psychiatric: normal mood and affect Diabetic Foot Exam - Simple   No data filed      Current Medications Patient's Medications  New Prescriptions   No medications on file  Previous Medications   ASPIRIN 81 MG TABLET    Take 1 tablet (81 mg total) by mouth daily.   ATORVASTATIN (LIPITOR) 40 MG TABLET    Take 1 tablet (40 mg total) by mouth daily.   EMPAGLIFLOZIN (JARDIANCE) 25 MG TABS TABLET    Take 1 tablet (25 mg total) by mouth daily before breakfast.   GLUCOSE BLOOD (FREESTYLE LITE) TEST STRIP    Use to check blood sugar twice a day   GLUCOSE MONITORING KIT (FREESTYLE) MONITORING KIT    1 each by Does not apply route as needed for other.   LANCETS (FREESTYLE) LANCETS    Use to test blood sugar once daily   LISINOPRIL (ZESTRIL) 20 MG TABLET    Take 1 tablet (20 mg total) by  mouth daily.   PIOGLITAZONE (ACTOS) 45 MG TABLET    Take 1 tablet (45 mg total) by mouth daily.  Modified Medications   Modified Medication Previous Medication   GLIMEPIRIDE (AMARYL) 2 MG TABLET glimepiride (AMARYL) 2 MG tablet      Take 1 tablet (2 mg total) by mouth daily before breakfast.    Take 1 tablet (2 mg total) by mouth daily before breakfast.   METFORMIN (GLUCOPHAGE-XR) 500 MG 24 HR TABLET metFORMIN (GLUCOPHAGE-XR) 500 MG 24 hr tablet      Take 4 tablets (2,000 mg total) by mouth daily.    Take 3 tablets (1,500 mg total) by mouth daily.  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  Social History Social History   Socioeconomic History   Marital status: Married    Spouse name: Not on file   Number of children: Not on file   Years of education: Not on file   Highest education level: Not on file  Occupational History   Occupation: Unemployed  Tobacco Use   Smoking status: Never   Smokeless tobacco: Never  Substance and Sexual Activity   Alcohol use: Yes    Comment: rarely   Drug use: No   Sexual activity: Not on file  Other Topics Concern   Not on file  Social History Narrative   Not on file   Social Determinants of Health   Financial  Resource Strain: Not on file  Food Insecurity: No Food Insecurity (10/11/2022)   Hunger Vital Sign    Worried About Running Out of Food in the Last Year: Never true    Ran Out of Food in the Last Year: Never true  Transportation Needs: Not on file  Physical Activity: Not on file  Stress: Not on file  Social Connections: Not on file  Intimate Partner Violence: Not on file    Lab Results  Component Value Date   HGBA1C 7.1 (A) 04/28/2023   HGBA1C 7.6 (A) 02/03/2023   HGBA1C 9.0 (H) 10/15/2022   Lab Results  Component Value Date   CHOL 123 01/13/2023   Lab Results  Component Value Date   HDL 51.40 01/13/2023   Lab Results  Component Value Date   LDLCALC 61 01/13/2023   Lab Results  Component Value Date   TRIG 56.0 01/13/2023   Lab Results  Component Value Date   CHOLHDL 2 01/13/2023   Lab Results  Component Value Date   CREATININE 0.89 01/13/2023   Lab Results  Component Value Date   GFR 88.42 01/13/2023   Lab Results  Component Value Date   MICROALBUR 6.6 (H) 05/24/2022      Component Value Date/Time   NA 142 01/13/2023 0925   K 4.0 01/13/2023 0925   CL 107 01/13/2023 0925   CO2 26 01/13/2023 0925   GLUCOSE 174 (H) 01/13/2023 0925   BUN 22 01/13/2023 0925   CREATININE 0.89 01/13/2023 0925   CALCIUM 9.2 01/13/2023 0925   PROT 6.6 01/13/2023 0925   ALBUMIN 4.1 01/13/2023 0925   AST 22 01/13/2023 0925   ALT 25 01/13/2023 0925   ALKPHOS 68 01/13/2023 0925   BILITOT 0.6 01/13/2023 0925   GFRNONAA 77.81 07/30/2010 1036   GFRAA 114 07/12/2008 0951      Latest Ref Rng & Units 01/13/2023    9:25 AM 10/15/2022    8:38 AM 06/28/2022    8:38 AM  BMP  Glucose 70 - 99 mg/dL 130  865  784   BUN 6 - 23 mg/dL 22  16    Creatinine 6.96 - 1.50 mg/dL 2.95  2.84    Sodium 132 - 145 mEq/L 142  140    Potassium 3.5 -  5.1 mEq/L 4.0  3.7    Chloride 96 - 112 mEq/L 107  103    CO2 19 - 32 mEq/L 26  29    Calcium 8.4 - 10.5 mg/dL 9.2  9.8         Component Value  Date/Time   WBC 4.8 01/13/2023 0925   RBC 4.46 01/13/2023 0925   HGB 14.4 01/13/2023 0925   HCT 43.7 01/13/2023 0925   PLT 197.0 01/13/2023 0925   MCV 97.9 01/13/2023 0925   MCHC 33.0 01/13/2023 0925   RDW 12.9 01/13/2023 0925   LYMPHSABS 0.9 11/09/2021 0945   MONOABS 0.3 11/09/2021 0945   EOSABS 0.1 11/09/2021 0945   BASOSABS 0.0 11/09/2021 0945     Parts of this note may have been dictated using voice recognition software. There may be variances in spelling and vocabulary which are unintentional. Not all errors are proofread. Please notify the Thereasa Parkin if any discrepancies are noted or if the meaning of any statement is not clear.

## 2023-05-13 ENCOUNTER — Other Ambulatory Visit (HOSPITAL_COMMUNITY): Payer: Self-pay

## 2023-05-14 ENCOUNTER — Other Ambulatory Visit: Payer: Self-pay

## 2023-05-15 ENCOUNTER — Other Ambulatory Visit: Payer: Self-pay

## 2023-05-19 ENCOUNTER — Other Ambulatory Visit (HOSPITAL_COMMUNITY): Payer: Self-pay

## 2023-05-19 ENCOUNTER — Encounter: Payer: Self-pay | Admitting: Pharmacist

## 2023-05-19 ENCOUNTER — Other Ambulatory Visit: Payer: Self-pay

## 2023-05-19 ENCOUNTER — Other Ambulatory Visit (HOSPITAL_BASED_OUTPATIENT_CLINIC_OR_DEPARTMENT_OTHER): Payer: Self-pay

## 2023-05-21 ENCOUNTER — Other Ambulatory Visit (HOSPITAL_COMMUNITY): Payer: Self-pay

## 2023-05-21 ENCOUNTER — Other Ambulatory Visit: Payer: Self-pay

## 2023-05-29 ENCOUNTER — Other Ambulatory Visit (HOSPITAL_COMMUNITY): Payer: Self-pay

## 2023-06-23 ENCOUNTER — Other Ambulatory Visit (HOSPITAL_COMMUNITY): Payer: Self-pay

## 2023-07-02 ENCOUNTER — Encounter: Payer: Self-pay | Admitting: "Endocrinology

## 2023-07-07 ENCOUNTER — Telehealth: Payer: Self-pay

## 2023-07-07 NOTE — Telephone Encounter (Signed)
Medication Samples have been provided to the patient.  Drug name: Jardiance       Strength: 25mg         Qty: 4  LOT: [id number]  Exp.Date: 11/25  Dosing instructions: 1 tablet daily  The patient has been instructed regarding the correct time, dose, and frequency of taking this medication, including desired effects and most common side effects.   [name] 11:40 AM 07/07/2023

## 2023-07-14 ENCOUNTER — Other Ambulatory Visit: Payer: Self-pay | Admitting: Endocrinology

## 2023-07-14 ENCOUNTER — Other Ambulatory Visit (HOSPITAL_COMMUNITY): Payer: Self-pay

## 2023-07-14 DIAGNOSIS — E1165 Type 2 diabetes mellitus with hyperglycemia: Secondary | ICD-10-CM

## 2023-07-15 ENCOUNTER — Other Ambulatory Visit (HOSPITAL_COMMUNITY): Payer: Self-pay

## 2023-07-16 ENCOUNTER — Other Ambulatory Visit (HOSPITAL_COMMUNITY): Payer: Self-pay

## 2023-07-16 MED ORDER — FREESTYLE LITE TEST VI STRP
ORAL_STRIP | 12 refills | Status: DC
  Filled 2023-07-16 – 2024-02-04 (×2): qty 100, 50d supply, fill #0

## 2023-07-17 ENCOUNTER — Other Ambulatory Visit (HOSPITAL_COMMUNITY): Payer: Self-pay

## 2023-07-17 ENCOUNTER — Other Ambulatory Visit: Payer: Self-pay

## 2023-07-18 ENCOUNTER — Other Ambulatory Visit (HOSPITAL_COMMUNITY): Payer: Self-pay

## 2023-07-18 ENCOUNTER — Other Ambulatory Visit: Payer: Self-pay

## 2023-07-21 ENCOUNTER — Encounter: Payer: Self-pay | Admitting: Family Medicine

## 2023-07-21 ENCOUNTER — Ambulatory Visit (INDEPENDENT_AMBULATORY_CARE_PROVIDER_SITE_OTHER): Payer: PPO | Admitting: Family Medicine

## 2023-07-21 ENCOUNTER — Other Ambulatory Visit: Payer: Self-pay

## 2023-07-21 ENCOUNTER — Other Ambulatory Visit (HOSPITAL_COMMUNITY): Payer: Self-pay

## 2023-07-21 VITALS — BP 128/70 | HR 88 | Temp 98.3°F | Ht 66.0 in | Wt 142.4 lb

## 2023-07-21 DIAGNOSIS — Z23 Encounter for immunization: Secondary | ICD-10-CM | POA: Insufficient documentation

## 2023-07-21 DIAGNOSIS — Z125 Encounter for screening for malignant neoplasm of prostate: Secondary | ICD-10-CM

## 2023-07-21 DIAGNOSIS — Z0000 Encounter for general adult medical examination without abnormal findings: Secondary | ICD-10-CM | POA: Diagnosis not present

## 2023-07-21 DIAGNOSIS — I10 Essential (primary) hypertension: Secondary | ICD-10-CM

## 2023-07-21 DIAGNOSIS — E7800 Pure hypercholesterolemia, unspecified: Secondary | ICD-10-CM

## 2023-07-21 DIAGNOSIS — E538 Deficiency of other specified B group vitamins: Secondary | ICD-10-CM | POA: Diagnosis not present

## 2023-07-21 LAB — CBC WITH DIFFERENTIAL/PLATELET
Basophils Absolute: 0.1 10*3/uL (ref 0.0–0.1)
Basophils Relative: 1.4 % (ref 0.0–3.0)
Eosinophils Absolute: 0.1 10*3/uL (ref 0.0–0.7)
Eosinophils Relative: 1.8 % (ref 0.0–5.0)
HCT: 47 % (ref 39.0–52.0)
Hemoglobin: 15.4 g/dL (ref 13.0–17.0)
Lymphocytes Relative: 36.5 % (ref 12.0–46.0)
Lymphs Abs: 1.8 10*3/uL (ref 0.7–4.0)
MCHC: 32.7 g/dL (ref 30.0–36.0)
MCV: 99.3 fL (ref 78.0–100.0)
Monocytes Absolute: 0.3 10*3/uL (ref 0.1–1.0)
Monocytes Relative: 6.2 % (ref 3.0–12.0)
Neutro Abs: 2.6 10*3/uL (ref 1.4–7.7)
Neutrophils Relative %: 54.1 % (ref 43.0–77.0)
Platelets: 220 10*3/uL (ref 150.0–400.0)
RBC: 4.73 Mil/uL (ref 4.22–5.81)
RDW: 12.9 % (ref 11.5–15.5)
WBC: 4.9 10*3/uL (ref 4.0–10.5)

## 2023-07-21 LAB — COMPREHENSIVE METABOLIC PANEL
ALT: 23 U/L (ref 0–53)
AST: 19 U/L (ref 0–37)
Albumin: 4.6 g/dL (ref 3.5–5.2)
Alkaline Phosphatase: 89 U/L (ref 39–117)
BUN: 18 mg/dL (ref 6–23)
CO2: 29 meq/L (ref 19–32)
Calcium: 9.9 mg/dL (ref 8.4–10.5)
Chloride: 104 meq/L (ref 96–112)
Creatinine, Ser: 0.98 mg/dL (ref 0.40–1.50)
GFR: 79.28 mL/min (ref >60.00)
Glucose, Bld: 181 mg/dL — ABNORMAL HIGH (ref 70–99)
Potassium: 3.7 meq/L (ref 3.5–5.1)
Sodium: 143 meq/L (ref 135–145)
Total Bilirubin: 0.8 mg/dL (ref 0.2–1.2)
Total Protein: 7.3 g/dL (ref 6.0–8.3)

## 2023-07-21 LAB — MICROALBUMIN / CREATININE URINE RATIO
Creatinine,U: 47.7 mg/dL
Microalb Creat Ratio: 11.9 mg/g (ref 0.0–30.0)
Microalb, Ur: 5.7 mg/dL — ABNORMAL HIGH (ref 0.0–1.9)

## 2023-07-21 LAB — LIPID PANEL
Cholesterol: 138 mg/dL (ref 0–200)
HDL: 53.3 mg/dL (ref >39.00)
LDL Cholesterol: 72 mg/dL (ref 0–99)
NonHDL: 85.1
Total CHOL/HDL Ratio: 3
Triglycerides: 66 mg/dL (ref 0.0–149.0)
VLDL: 13.2 mg/dL (ref 0.0–40.0)

## 2023-07-21 LAB — URINALYSIS, ROUTINE W REFLEX MICROSCOPIC
Bilirubin Urine: NEGATIVE
Leukocytes,Ua: NEGATIVE
Nitrite: NEGATIVE
Specific Gravity, Urine: 1.015 (ref 1.000–1.030)
Total Protein, Urine: NEGATIVE
Urine Glucose: >=1000 — AB
Urobilinogen, UA: 0.2 (ref 0.0–1.0)
pH: 5.5 (ref 5.0–8.0)

## 2023-07-21 LAB — PSA: PSA: 1.75 ng/mL (ref 0.10–4.00)

## 2023-07-21 LAB — VITAMIN B12: Vitamin B-12: 1120 pg/mL — ABNORMAL HIGH (ref 211–911)

## 2023-07-21 MED ORDER — ATORVASTATIN CALCIUM 40 MG PO TABS
40.0000 mg | ORAL_TABLET | Freq: Every day | ORAL | 3 refills | Status: DC
Start: 2023-07-21 — End: 2024-05-17
  Filled 2023-07-21: qty 90, 90d supply, fill #0
  Filled 2023-10-17: qty 90, 90d supply, fill #1
  Filled 2024-01-08: qty 90, 90d supply, fill #2
  Filled 2024-04-05: qty 90, 90d supply, fill #3

## 2023-07-21 MED ORDER — LISINOPRIL 20 MG PO TABS
20.0000 mg | ORAL_TABLET | Freq: Every day | ORAL | 3 refills | Status: DC
Start: 2023-07-21 — End: 2024-05-17
  Filled 2023-07-21: qty 90, 90d supply, fill #0
  Filled 2023-10-17: qty 90, 90d supply, fill #1
  Filled 2024-01-08: qty 90, 90d supply, fill #2
  Filled 2024-04-05: qty 90, 90d supply, fill #3
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
## 2023-07-22 ENCOUNTER — Encounter: Payer: Self-pay | Admitting: "Endocrinology

## 2023-07-22 ENCOUNTER — Other Ambulatory Visit: Payer: Self-pay

## 2023-07-22 ENCOUNTER — Other Ambulatory Visit (HOSPITAL_COMMUNITY): Payer: Self-pay

## 2023-07-22 DIAGNOSIS — E1165 Type 2 diabetes mellitus with hyperglycemia: Secondary | ICD-10-CM

## 2023-07-22 MED ORDER — METFORMIN HCL ER 500 MG PO TB24
2000.0000 mg | ORAL_TABLET | Freq: Every day | ORAL | 0 refills | Status: DC
  Filled 2023-07-22: qty 360, 90d supply, fill #0

## 2023-09-01 ENCOUNTER — Ambulatory Visit: Payer: PPO | Admitting: "Endocrinology

## 2023-09-15 ENCOUNTER — Other Ambulatory Visit (HOSPITAL_COMMUNITY): Payer: Self-pay

## 2023-10-16 ENCOUNTER — Ambulatory Visit: Payer: PPO | Admitting: "Endocrinology

## 2023-10-17 ENCOUNTER — Other Ambulatory Visit: Payer: Self-pay | Admitting: "Endocrinology

## 2023-10-17 ENCOUNTER — Other Ambulatory Visit: Payer: Self-pay

## 2023-10-17 ENCOUNTER — Encounter (HOSPITAL_COMMUNITY): Payer: Self-pay

## 2023-10-17 ENCOUNTER — Other Ambulatory Visit (HOSPITAL_COMMUNITY): Payer: Self-pay

## 2023-10-17 DIAGNOSIS — E1165 Type 2 diabetes mellitus with hyperglycemia: Secondary | ICD-10-CM

## 2023-10-17 MED ORDER — GLIMEPIRIDE 2 MG PO TABS
2.0000 mg | ORAL_TABLET | Freq: Every day | ORAL | 1 refills | Status: DC
  Filled 2023-10-17: qty 90, 90d supply, fill #0
  Filled 2024-01-08: qty 90, 90d supply, fill #1

## 2023-10-17 MED ORDER — METFORMIN HCL ER 500 MG PO TB24
2000.0000 mg | ORAL_TABLET | Freq: Every day | ORAL | 0 refills | Status: DC
  Filled 2023-10-17: qty 360, 90d supply, fill #0

## 2023-10-17 MED ORDER — EMPAGLIFLOZIN 25 MG PO TABS
25.0000 mg | ORAL_TABLET | Freq: Every day | ORAL | 1 refills | Status: DC
  Filled 2023-10-17: qty 90, 90d supply, fill #0
  Filled 2024-01-08: qty 90, 90d supply, fill #1

## 2023-10-17 NOTE — Telephone Encounter (Signed)
Medication refill request complete

## 2023-10-21 ENCOUNTER — Other Ambulatory Visit (HOSPITAL_COMMUNITY): Payer: Self-pay

## 2023-10-23 ENCOUNTER — Other Ambulatory Visit (HOSPITAL_COMMUNITY): Payer: Self-pay

## 2023-10-23 ENCOUNTER — Other Ambulatory Visit: Payer: Self-pay

## 2023-10-23 ENCOUNTER — Other Ambulatory Visit: Payer: Self-pay | Admitting: Endocrinology

## 2023-10-23 MED ORDER — PIOGLITAZONE HCL 45 MG PO TABS
45.0000 mg | ORAL_TABLET | Freq: Every day | ORAL | 1 refills | Status: DC
  Filled 2023-10-23: qty 90, 90d supply, fill #0
  Filled 2024-01-08: qty 90, 90d supply, fill #1

## 2023-11-11 ENCOUNTER — Ambulatory Visit: Admitting: "Endocrinology

## 2023-11-11 ENCOUNTER — Telehealth: Payer: Self-pay

## 2023-11-11 ENCOUNTER — Encounter: Payer: Self-pay | Admitting: "Endocrinology

## 2023-11-11 VITALS — BP 140/70 | HR 92 | Ht 66.0 in | Wt 140.0 lb

## 2023-11-11 DIAGNOSIS — Z7984 Long term (current) use of oral hypoglycemic drugs: Secondary | ICD-10-CM

## 2023-11-11 DIAGNOSIS — E7800 Pure hypercholesterolemia, unspecified: Secondary | ICD-10-CM

## 2023-11-11 DIAGNOSIS — E1165 Type 2 diabetes mellitus with hyperglycemia: Secondary | ICD-10-CM

## 2023-11-11 LAB — POCT GLYCOSYLATED HEMOGLOBIN (HGB A1C): Hemoglobin A1C: 7.4 % — AB (ref 4.0–5.6)

## 2023-11-11 NOTE — Progress Notes (Signed)
 Outpatient Endocrinology Note Elijah [place], MD  11/11/23   Milus Height [birth year] [id number]  Referring Provider: Mliss Sax,* Primary Care Provider: Mliss Sax, MD Reason for consultation: Subjective   Assessment & Plan  Diagnoses and all orders for this visit:  Uncontrolled type 2 diabetes mellitus with hyperglycemia, without long-term current use of insulin (HCC) -     POCT glycosylated hemoglobin (Hb A1C)  Long term (current) use of oral hypoglycemic drugs  Pure hypercholesterolemia   Diabetes complicated by hyperglycemia Hba1c goal less than 9.0, current Hba1c is 7.4. Will recommend for the following change of medications to: metformin ER 2000 mg daily, Actos 45 mg daily, jardiance 25 mg qd, amaryl 2 mg qd  Check BG alternate times of day  11/11/23 Gave assistance cars and samples for jardiance   No known contraindications/side effects to any of above medications Explained effects of Amaryl, skip if sick/low BG Previously,  explained if develops pedal edema, to discuss/stop actos-risk of CHF, no bladder cancer history in self/family  Counseled against diet drink  Hyperlipidemia -Last LDL at goal: 72 -on statin atorvastatin 40 mg QD -Follow low fat diet and exercise   -Blood pressure goal <140/90 - Microalbumin/creatinine at goal < 30 -on ACE/ARB lisinopril 20 mg qd -diet changes including salt restriction -limit eating outside -counseled BP targets per standards of diabetes care -Uncontrolled blood pressure can lead to retinopathy, nephropathy and cardiovascular and atherosclerotic heart disease  Reviewed and counseled on: -A1C target -Blood sugar targets -Complications of uncontrolled diabetes  -Checking blood sugar before meals and bedtime and bring log next visit -All medications with mechanism of action and side effects -Hypoglycemia management: rule of 15's, Glucagon Emergency Kit and medical alert ID -low-carb  low-fat plate-method diet -At least 20 minutes of physical activity per day -Annual dilated retinal eye exam and foot exam -compliance and follow up needs -follow up as scheduled or earlier if problem gets worse  Call if blood sugar is less than 70 or consistently above 250    Take a 15 gm snack of carbohydrate at bedtime before you go to sleep if your blood sugar is less than 100.    If you are going to fast after midnight for a test or procedure, ask your physician for instructions on how to reduce/decrease your insulin dose.    Call if blood sugar is less than 70 or consistently above 250  -Treating a low sugar by rule of 15  (15 gms of sugar every 15 min until sugar is more than 70) If you feel your sugar is low, test your sugar to be sure If your sugar is low (less than 70), then take 15 grams of a fast acting Carbohydrate (3-4 glucose tablets or glucose gel or 4 ounces of juice or regular soda) Recheck your sugar 15 min after treating low to make sure it is more than 70 If sugar is still less than 70, treat again with 15 grams of carbohydrate          Don't drive the hour of hypoglycemia  If unconscious/unable to eat or drink by mouth, use glucagon injection or nasal spray baqsimi and call 911. Can repeat again in 15 min if still unconscious.  No follow-ups on file.   I have reviewed current medications, nurse's notes, allergies, vital signs, past medical and surgical history, family medical history, and social history for this encounter. Counseled patient on symptoms, examination findings, lab findings, imaging results, treatment decisions  and monitoring and prognosis. The patient understood the recommendations and agrees with the treatment plan. All questions regarding treatment plan were fully answered.  Elijah Granger, MD  11/11/23    History of Present Illness Elijah Cervantes is a 68 y.o. year old male who presents for evaluation of Type 2 diabetes mellitus.  Elijah Cervantes was first diagnosed in 1996.   Diabetes education +  Home diabetes regimen: metformin ER 1500 mg daily, Actos 45 mg daily, jardiance 25 mg qd, amaryl 2 mg qd   Previous history:  Non-insulin hypoglycemic drugs previously used: Metformin, Actos since 2021, Prandin since 2019, Rybelsus, Repaglinide 2-3 times a day   range in the last few years is: 6.6-8  COMPLICATIONS -  MI/Stroke -  retinopathy, last eye exam 2023 -  neuropathy -  nephropathy  BLOOD SUGAR DATA Did not bring meter Reports every day checking 103-135   Physical Exam  BP (!) 140/70   Pulse 92   Ht 5\' 6"  (1.676 m)   Wt 140 lb (63.5 kg)   SpO2 97%   BMI 22.60 kg/m    Constitutional: well developed, well nourished Head: normocephalic, atraumatic Eyes: sclera anicteric, no redness Neck: supple Lungs: normal respiratory effort Neurology: alert and oriented Skin: dry, no appreciable rashes Musculoskeletal: no appreciable defects Psychiatric: normal mood and affect Diabetic Foot Exam - Simple   No data filed      Current Medications Patient's Medications  New Prescriptions   No medications on file  Previous Medications   ASPIRIN 81 MG TABLET    Take 1 tablet (81 mg total) by mouth daily.   ATORVASTATIN (LIPITOR) 40 MG TABLET    Take 1 tablet (40 mg total) by mouth daily.   EMPAGLIFLOZIN (JARDIANCE) 25 MG TABS TABLET    Take 1 tablet (25 mg total) by mouth daily before breakfast.   GLIMEPIRIDE (AMARYL) 2 MG TABLET    Take 1 tablet (2 mg total) by mouth daily before breakfast.   GLUCOSE BLOOD (FREESTYLE LITE) TEST STRIP    Use to check blood sugar twice a day   GLUCOSE MONITORING KIT (FREESTYLE) MONITORING KIT    1 each by Does not apply route as needed for other.   LANCETS (FREESTYLE) LANCETS    Use to test blood sugar once daily   LISINOPRIL (ZESTRIL) 20 MG TABLET    Take 1 tablet (20 mg total) by mouth daily.   METFORMIN (GLUCOPHAGE-XR) 500 MG 24 HR TABLET    Take 4 tablets (2,000 mg total) by  mouth daily.   PIOGLITAZONE (ACTOS) 45 MG TABLET    Take 1 tablet (45 mg total) by mouth daily.  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  Social History Social History   Socioeconomic History   Marital status: Married    Spouse name: Not on file   Number of children: Not on file   Years of education: Not on file   Highest education level: Not on file  Occupational History   Occupation: Unemployed  Tobacco Use   Smoking status: Never   Smokeless tobacco: Never  Substance and Sexual Activity   Alcohol use: Yes    Comment: rarely   Drug use: No   Sexual activity: Not on file  Other Topics Concern   Not on file  Social History Narrative   Not on file   Social Drivers of Health   Financial Resource Strain: Not on file  Food Insecurity: No Food Insecurity (10/11/2022)   Hunger Vital Sign    Worried About Running Out of Food in the Last Year: Never true    Ran Out of Food in the Last Year: Never true  Transportation Needs: Not on file  Physical Activity: Not on file  Stress: Not on file  Social Connections: Not on file  Intimate Partner Violence: Not on file    Lab Results  Component Value Date    HGBA1C 7.4 (A) 11/11/2023   HGBA1C 7.1 (A) 04/28/2023   HGBA1C 7.6 (A) 02/03/2023   Lab Results  Component Value Date   CHOL 138 07/21/2023   Lab Results  Component Value Date   HDL 53.30 07/21/2023   Lab Results  Component Value Date   LDLCALC 72 07/21/2023   Lab Results  Component Value Date   TRIG 66.0 07/21/2023   Lab Results  Component Value Date   CHOLHDL 3 07/21/2023   Lab Results  Component Value Date   CREATININE 0.98 07/21/2023   Lab Results  Component Value Date   GFR 79.28 07/21/2023   Lab Results  Component Value Date   MICROALBUR 5.7 (H) 07/21/2023      Component Value Date/Time   NA 143 07/21/2023 0856   K 3.7 07/21/2023 0856   CL 104 07/21/2023 0856   CO2 29 07/21/2023 0856   GLUCOSE 181 (H) 07/21/2023 0856   BUN 18 07/21/2023 0856   CREATININE 0.98 07/21/2023 0856   CALCIUM 9.9 07/21/2023 0856   PROT 7.3 07/21/2023 0856   ALBUMIN 4.6 07/21/2023 0856   AST 19 07/21/2023 0856   ALT 23 07/21/2023 0856   ALKPHOS 89 07/21/2023 0856   BILITOT 0.8 07/21/2023 0856   GFRNONAA 77.81 07/30/2010 1036   GFRAA 114 07/12/2008 0951      Latest Ref Rng & Units 07/21/2023    8:56 AM 01/13/2023    9:25 AM 10/15/2022    8:38 AM  BMP  Glucose 70 - 99 mg/dL 161  096  045   BUN 6 - 23 mg/dL 18  22  16    Creatinine 0.40 - 1.50 mg/dL 4.09  8.11  9.14   Sodium 135 - 145 mEq/L 143  142  140   Potassium 3.5 - 5.1 mEq/L 3.7  4.0  3.7   Chloride 96 - 112 mEq/L 104  107  103   CO2 19 - 32 mEq/L 29  26  29    Calcium 8.4 - 10.5 mg/dL 9.9  9.2  9.8        Component Value Date/Time   WBC 4.9 07/21/2023 0856   RBC 4.73 07/21/2023 0856   HGB 15.4 07/21/2023 0856   HCT 47.0 07/21/2023 0856  PLT 220.0 07/21/2023 0856   MCV 99.3 07/21/2023 0856   MCHC 32.7 07/21/2023 0856   RDW 12.9 07/21/2023 0856   LYMPHSABS 1.8 07/21/2023 0856   MONOABS 0.3 07/21/2023 0856   EOSABS 0.1 07/21/2023 0856   BASOSABS 0.1 07/21/2023 0856     Parts of this note may have  been dictated using voice recognition software. There may be variances in spelling and vocabulary which are unintentional. Not all errors are proofread. Please notify the Thereasa Parkin if any discrepancies are noted or if the meaning of any statement is not clear.

## 2023-11-11 NOTE — Telephone Encounter (Signed)
 Sample   Medication:Jardiance  Dose: 25mg  Quantity:4   Kevon Tench,H CMA

## 2023-11-11 NOTE — Patient Instructions (Signed)

## 2023-12-18 DIAGNOSIS — E119 Type 2 diabetes mellitus without complications: Secondary | ICD-10-CM | POA: Diagnosis not present

## 2023-12-18 DIAGNOSIS — H40013 Open angle with borderline findings, low risk, bilateral: Secondary | ICD-10-CM | POA: Diagnosis not present

## 2023-12-18 LAB — HM DIABETES EYE EXAM

## 2024-01-01 ENCOUNTER — Encounter: Payer: Self-pay | Admitting: Family Medicine

## 2024-01-08 ENCOUNTER — Other Ambulatory Visit (HOSPITAL_COMMUNITY): Payer: Self-pay

## 2024-01-08 ENCOUNTER — Other Ambulatory Visit: Payer: Self-pay

## 2024-01-08 ENCOUNTER — Other Ambulatory Visit: Payer: Self-pay | Admitting: "Endocrinology

## 2024-01-08 DIAGNOSIS — E1165 Type 2 diabetes mellitus with hyperglycemia: Secondary | ICD-10-CM

## 2024-01-08 MED ORDER — METFORMIN HCL ER 500 MG PO TB24
2000.0000 mg | ORAL_TABLET | Freq: Every day | ORAL | 0 refills | Status: DC
  Filled 2024-01-08: qty 360, 90d supply, fill #0

## 2024-01-08 NOTE — Telephone Encounter (Signed)
 Requested Prescriptions   Pending Prescriptions Disp Refills   metFORMIN  (GLUCOPHAGE -XR) 500 MG 24 hr tablet 360 tablet 0    Sig: Take 4 tablets (2,000 mg total) by mouth daily.

## 2024-01-19 ENCOUNTER — Encounter: Payer: Self-pay | Admitting: Family Medicine

## 2024-01-19 ENCOUNTER — Ambulatory Visit: Payer: PPO | Admitting: Family Medicine

## 2024-01-19 VITALS — BP 138/64 | HR 70 | Temp 97.8°F | Ht 66.0 in | Wt 140.8 lb

## 2024-01-19 DIAGNOSIS — E7800 Pure hypercholesterolemia, unspecified: Secondary | ICD-10-CM | POA: Diagnosis not present

## 2024-01-19 DIAGNOSIS — I10 Essential (primary) hypertension: Secondary | ICD-10-CM

## 2024-01-19 DIAGNOSIS — Z23 Encounter for immunization: Secondary | ICD-10-CM

## 2024-01-19 LAB — COMPREHENSIVE METABOLIC PANEL WITH GFR
ALT: 35 U/L (ref 0–53)
AST: 24 U/L (ref 0–37)
Albumin: 4.3 g/dL (ref 3.5–5.2)
Alkaline Phosphatase: 67 U/L (ref 39–117)
BUN: 15 mg/dL (ref 6–23)
CO2: 29 meq/L (ref 19–32)
Calcium: 9.4 mg/dL (ref 8.4–10.5)
Chloride: 104 meq/L (ref 96–112)
Creatinine, Ser: 0.93 mg/dL (ref 0.40–1.50)
GFR: 84.12 mL/min (ref >60.00)
Glucose, Bld: 185 mg/dL — ABNORMAL HIGH (ref 70–99)
Potassium: 4 meq/L (ref 3.5–5.1)
Sodium: 141 meq/L (ref 135–145)
Total Bilirubin: 0.7 mg/dL (ref 0.2–1.2)
Total Protein: 6.7 g/dL (ref 6.0–8.3)

## 2024-01-19 LAB — LIPID PANEL
Cholesterol: 123 mg/dL (ref 0–200)
HDL: 50.6 mg/dL (ref >39.00)
LDL Cholesterol: 57 mg/dL (ref 0–99)
NonHDL: 72.27
Total CHOL/HDL Ratio: 2
Triglycerides: 78 mg/dL (ref 0.0–149.0)
VLDL: 15.6 mg/dL (ref 0.0–40.0)
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
## 2024-01-20 ENCOUNTER — Ambulatory Visit: Payer: Self-pay | Admitting: Family Medicine

## 2024-02-04 ENCOUNTER — Other Ambulatory Visit: Payer: Self-pay | Admitting: "Endocrinology

## 2024-02-04 ENCOUNTER — Other Ambulatory Visit (HOSPITAL_COMMUNITY): Payer: Self-pay

## 2024-02-04 DIAGNOSIS — E1165 Type 2 diabetes mellitus with hyperglycemia: Secondary | ICD-10-CM

## 2024-02-09 ENCOUNTER — Other Ambulatory Visit: Payer: Self-pay

## 2024-02-09 ENCOUNTER — Other Ambulatory Visit (HOSPITAL_COMMUNITY): Payer: Self-pay

## 2024-02-09 DIAGNOSIS — E1165 Type 2 diabetes mellitus with hyperglycemia: Secondary | ICD-10-CM

## 2024-02-09 MED ORDER — ONETOUCH VERIO VI STRP
1.0000 | ORAL_STRIP | Freq: Three times a day (TID) | 5 refills | Status: DC
  Filled 2024-02-09: qty 300, 100d supply, fill #0
  Filled 2024-05-28: qty 100, 30d supply, fill #1
  Filled 2024-05-28: qty 300, 100d supply, fill #1
  Filled 2024-09-11: qty 300, 100d supply, fill #2

## 2024-02-09 MED ORDER — ONETOUCH DELICA LANCETS 33G MISC
1.0000 | 5 refills | Status: DC | PRN
  Filled 2024-02-09: qty 300, 90d supply, fill #0

## 2024-02-09 MED ORDER — ONETOUCH VERIO REFLECT W/DEVICE KIT
PACK | 0 refills | Status: DC
  Filled 2024-02-09: qty 1, 90d supply, fill #0

## 2024-02-09 NOTE — Telephone Encounter (Signed)
 Requested Prescriptions   Pending Prescriptions Disp Refills   Blood Glucose Monitoring Suppl (ONETOUCH VERIO REFLECT) w/Device KIT 1 kit 0    Sig: Check blood sugar three times a day.   OneTouch Delica Lancets 33G MISC 300 each 5    Sig: 1 each by Other route as needed.   glucose blood (ONETOUCH VERIO) test strip 300 each 5    Sig: 1 each by Other route 3 (three) times daily.

## 2024-02-10 ENCOUNTER — Other Ambulatory Visit (HOSPITAL_COMMUNITY): Payer: Self-pay

## 2024-02-10 ENCOUNTER — Other Ambulatory Visit: Payer: Self-pay

## 2024-02-12 ENCOUNTER — Other Ambulatory Visit (HOSPITAL_COMMUNITY): Payer: Self-pay

## 2024-02-19 ENCOUNTER — Other Ambulatory Visit (HOSPITAL_COMMUNITY): Payer: Self-pay

## 2024-03-02 ENCOUNTER — Ambulatory Visit: Admitting: "Endocrinology

## 2024-03-17 ENCOUNTER — Ambulatory Visit: Admitting: "Endocrinology

## 2024-04-01 ENCOUNTER — Encounter: Payer: Self-pay | Admitting: "Endocrinology

## 2024-04-01 ENCOUNTER — Ambulatory Visit: Admitting: "Endocrinology

## 2024-04-01 VITALS — BP 140/70 | HR 87 | Ht 66.0 in | Wt 141.0 lb

## 2024-04-01 DIAGNOSIS — E1165 Type 2 diabetes mellitus with hyperglycemia: Secondary | ICD-10-CM

## 2024-04-01 DIAGNOSIS — E7800 Pure hypercholesterolemia, unspecified: Secondary | ICD-10-CM | POA: Diagnosis not present

## 2024-04-01 DIAGNOSIS — Z7984 Long term (current) use of oral hypoglycemic drugs: Secondary | ICD-10-CM | POA: Diagnosis not present

## 2024-04-01 LAB — POCT GLYCOSYLATED HEMOGLOBIN (HGB A1C): Hemoglobin A1C: 7.4 % — AB (ref 4.0–5.6)

## 2024-04-01 MED ORDER — EMPAGLIFLOZIN 25 MG PO TABS: 25.0000 mg | ORAL_TABLET | Freq: Every day | ORAL | Status: DC

## 2024-04-01 NOTE — Progress Notes (Signed)
 Outpatient Endocrinology Note Elijah Birmingham, MD  04/01/24   LAKE CINQUEMANI 04-May-1956 [id number]  Referring Provider: Berneta Elsie Sayre,* Primary Care Provider: Berneta Elsie Sayre, MD Reason for consultation: Subjective   Assessment & Plan  Diagnoses and all orders for this visit:  Uncontrolled type 2 diabetes mellitus with hyperglycemia, without long-term current use of insulin (HCC) -     POCT glycosylated hemoglobin (Hb A1C) -     Microalbumin / creatinine urine ratio  Long term (current) use of oral hypoglycemic drugs  Pure hypercholesterolemia  Other orders -     empagliflozin  (JARDIANCE ) 25 MG TABS tablet; Take 1 tablet (25 mg total) by mouth daily before breakfast.    Diabetes complicated by hyperglycemia Hba1c goal less than 9.0, current Hba1c is 7.4. Will recommend for the following change of medications to: metformin  ER 2000 mg daily, Actos  45 mg daily, jardiance  25 mg qd, amaryl  2 mg qd  Check BG alternate times of day   Patient plans to be in [place]  No known contraindications/side effects to any of above medications Previously,  Explained the effects of Amaryl , skip if sick/low BG Explained if develops pedal edema, to discuss/stop actos -risk of CHF, no bladder cancer history in self/family  Counseled against diet drink  Hyperlipidemia -Last LDL at goal: 61 -on statin atorvastatin  40 mg QD -Follow low fat diet and exercise   -Blood pressure goal <140/90 - Microalbumin/creatinine at goal < 30 -on ACE/ARB lisinopril  20 mg qd -diet changes including salt restriction -limit eating outside -counseled BP targets per standards of diabetes care -Uncontrolled blood pressure can lead to retinopathy, nephropathy and cardiovascular and atherosclerotic heart disease  Reviewed and counseled on: -A1C target -Blood sugar targets -Complications of uncontrolled diabetes  -Checking blood sugar before meals and bedtime and bring  log next visit -All medications with mechanism of action and side effects -Hypoglycemia management: rule of 15's, Glucagon Emergency Kit and medical alert ID -low-carb low-fat plate-method diet -At least 20 minutes of physical activity per day -Annual dilated retinal eye exam and foot exam -compliance and follow up needs -follow up as scheduled or earlier if problem gets worse  Call if blood sugar is less than 70 or consistently above 250    Take a 15 gm snack of carbohydrate at bedtime before you go to sleep if your blood sugar is less than 100.    If you are going to fast after midnight for a test or procedure, ask your physician for instructions on how to reduce/decrease your insulin dose.    Call if blood sugar is less than 70 or consistently above 250  -Treating a low sugar by rule of 15  (15 gms of sugar every 15 min until sugar is more than 70) If you feel your sugar is low, test your sugar to be sure If your sugar is low (less than 70), then take 15 grams of a fast acting Carbohydrate (3-4 glucose tablets or glucose gel or 4 ounces of juice or regular soda) Recheck your sugar 15 min after treating low to make sure it is more than 70 If sugar is still less than 70, treat again with 15 grams of carbohydrate          Don't drive the hour of hypoglycemia  If unconscious/unable to eat or drink by mouth, use glucagon injection or nasal spray baqsimi and call 911. Can repeat again in 15 min if still unconscious.  Return in about 4  months (around 08/01/2024) for visit, labs today.   I have reviewed current medications, nurse's notes, allergies, vital signs, past medical and surgical history, family medical history, and social history for this encounter. Counseled patient on symptoms, examination findings, lab findings, imaging results, treatment decisions and monitoring and prognosis. The patient understood the recommendations and agrees with the treatment plan. All questions regarding  treatment plan were fully answered.  Elijah Birmingham, MD  04/01/24    History of Present Illness Elijah DAVARIOUS Cervantes is a 68 y.o. year old male who presents for follow-up of Type 2 diabetes mellitus.  Hawke DAROLD MILEY was first diagnosed in 1996.   Diabetes education +  Home diabetes regimen: metformin  ER 2000 mg daily, Actos  45 mg daily, jardiance  25 mg qd, amaryl  2 mg qd   Previous history:  Non-insulin hypoglycemic drugs previously used: Metformin , Actos  since 2021, Prandin  since 2019, Rybelsus , Repaglinide  2-3 times a day   range in the last few years is: 6.6-8  COMPLICATIONS -  MI/Stroke -  retinopathy, last eye exam 2023 -  neuropathy -  nephropathy  BLOOD SUGAR DATA Checks 0-3 times a day 117-205  Physical Exam  BP (!) 140/70   Pulse 87   Ht 5' 6 (1.676 m)   Wt 141 lb (64 kg)   SpO2 96%   BMI 22.76 kg/m    Constitutional: well developed, well nourished Head: normocephalic, atraumatic Eyes: sclera anicteric, no redness Neck: supple Lungs: normal respiratory effort Neurology: alert and oriented Skin: dry, no appreciable rashes Musculoskeletal: no appreciable defects Psychiatric: normal mood and affect Diabetic Foot Exam - Simple   No data filed      Current Medications Patient's Medications  New Prescriptions   No medications on file  Previous Medications   ASPIRIN 81 MG TABLET    Take 1 tablet (81 mg total) by mouth daily.   ATORVASTATIN  (LIPITOR) 40 MG TABLET    Take 1 tablet (40 mg total) by mouth daily.   BLOOD GLUCOSE MONITORING SUPPL (ONETOUCH VERIO REFLECT) W/DEVICE KIT    Check blood sugar three times a day.   GLIMEPIRIDE  (AMARYL ) 2 MG TABLET    Take 1 tablet (2 mg total) by mouth daily before breakfast.   GLUCOSE BLOOD (FREESTYLE LITE) TEST STRIP    Use to check blood sugar twice a day   GLUCOSE BLOOD (ONETOUCH VERIO) TEST STRIP    1 each by Other route 3 (three) times daily.   LANCETS (FREESTYLE) LANCETS    Use to test blood sugar once daily    LISINOPRIL  (ZESTRIL ) 20 MG TABLET    Take 1 tablet (20 mg total) by mouth daily.   METFORMIN  (GLUCOPHAGE -XR) 500 MG 24 HR TABLET    Take 4 tablets (2,000 mg total) by mouth daily.   ONETOUCH DELICA LANCETS 33G MISC    Use as directed as needed   PIOGLITAZONE  (ACTOS ) 45 MG TABLET    Take 1 tablet (45 mg total) by mouth daily.  Modified Medications   Modified Medication Previous Medication   EMPAGLIFLOZIN  (JARDIANCE ) 25 MG TABS TABLET empagliflozin  (JARDIANCE ) 25 MG TABS tablet      Take 1 tablet (25 mg total) by mouth daily before breakfast.    Take 1 tablet (25 mg total) by mouth daily before breakfast.  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  Social History Social History   Socioeconomic History   Marital status: Married    Spouse name: Not on file   Number of children: Not on file   Years of education: Not on file   Highest education level: Not on file  Occupational History   Occupation: Unemployed  Tobacco Use   Smoking status: Never   Smokeless tobacco: Never  Substance and Sexual Activity   Alcohol use: Yes    Comment: rarely   Drug use: No   Sexual activity: Not on file  Other Topics Concern   Not on file  Social History Narrative    Not on file   Social Drivers of Health   Financial Resource Strain: Not on file  Food Insecurity: No Food Insecurity (10/11/2022)   Hunger Vital Sign    Worried About Running Out of Food in the Last Year: Never true    Ran Out of Food in the Last Year: Never true  Transportation Needs: Not on file  Physical Activity: Not on file  Stress: Not on file  Social Connections: Not on file  Intimate Partner Violence: Not on file    Lab Results  Component Value Date   HGBA1C 7.4 (A) 04/01/2024   HGBA1C 7.4 (A) 11/11/2023   HGBA1C 7.1 (A) 04/28/2023   Lab Results  Component Value Date   CHOL 123 01/19/2024   Lab Results  Component Value Date   HDL 50.60 01/19/2024   Lab Results  Component Value Date   LDLCALC 57 01/19/2024   Lab Results  Component Value Date   TRIG 78.0 01/19/2024   Lab Results  Component Value Date   CHOLHDL 2 01/19/2024   Lab Results  Component Value Date   CREATININE 0.93 01/19/2024   Lab Results  Component Value Date   GFR 84.12 01/19/2024   Lab Results  Component Value Date   MICROALBUR 2.7 (H) 07/30/2010      Component Value Date/Time   NA 141 01/19/2024 0847   K 4.0 01/19/2024 0847   CL 104 01/19/2024 0847   CO2 29 01/19/2024 0847   GLUCOSE 185 (H) 01/19/2024 0847   BUN 15 01/19/2024 0847   CREATININE 0.93 01/19/2024 0847   CALCIUM  9.4 01/19/2024 0847   PROT 6.7 01/19/2024 0847   ALBUMIN 4.3 01/19/2024 0847   AST 24 01/19/2024 0847   ALT 35 01/19/2024 0847   ALKPHOS 67 01/19/2024 0847   BILITOT 0.7 01/19/2024 0847   GFRNONAA 77.81 07/30/2010 1036   GFRAA 114 07/12/2008 0951      Latest Ref Rng & Units 01/19/2024    8:47 AM 07/21/2023    8:56 AM 01/13/2023    9:25 AM  BMP  Glucose 70 - 99 mg/dL 814  818  825   BUN 6 - 23 mg/dL 15  18  22    Creatinine 0.40 - 1.50 mg/dL 9.06  9.01  0.89   Sodium 135 - 145 mEq/L 141  143  142   Potassium 3.5 - 5.1 mEq/L 4.0  3.7  4.0   Chloride 96 - 112 mEq/L 104  104  107   CO2 19 - 32 mEq/L  29  29  26    Calcium  8.4 - 10.5 mg/dL 9.4  9.9  9.2        Component Value Date/Time   WBC 4.9 07/21/2023 0856   RBC 4.73 07/21/2023 0856   HGB 15.4 07/21/2023 0856   HCT 47.0 07/21/2023 0856   PLT 220.0 07/21/2023 0856   MCV 99.3 07/21/2023 0856   MCHC 32.7 07/21/2023 0856   RDW 12.9 07/21/2023 0856   LYMPHSABS 1.8 07/21/2023 0856   MONOABS 0.3 07/21/2023 0856   EOSABS 0.1 07/21/2023 0856   BASOSABS 0.1 07/21/2023 0856     Parts of this note may have been dictated using voice recognition software. There may be variances in spelling and vocabulary which are unintentional. Not all errors are proofread. Please notify the dino if any discrepancies are noted or if the meaning of any statement is not clear.

## 2024-04-02 LAB — MICROALBUMIN / CREATININE URINE RATIO
Creatinine, Urine: 66 mg/dL (ref 20–320)
Microalb Creat Ratio: 85 mg/g{creat} — ABNORMAL HIGH (ref <30)
Microalb, Ur: 5.6 mg/dL

## 2024-04-05 ENCOUNTER — Other Ambulatory Visit (HOSPITAL_COMMUNITY): Payer: Self-pay

## 2024-04-05 ENCOUNTER — Other Ambulatory Visit: Payer: Self-pay | Admitting: "Endocrinology

## 2024-04-05 DIAGNOSIS — E1165 Type 2 diabetes mellitus with hyperglycemia: Secondary | ICD-10-CM

## 2024-04-06 ENCOUNTER — Other Ambulatory Visit (HOSPITAL_COMMUNITY): Payer: Self-pay

## 2024-04-06 MED ORDER — METFORMIN HCL ER 500 MG PO TB24
2000.0000 mg | ORAL_TABLET | Freq: Every day | ORAL | 0 refills | Status: DC
  Filled 2024-04-06 – 2024-04-07 (×2): qty 360, 90d supply, fill #0

## 2024-04-06 MED ORDER — GLIMEPIRIDE 2 MG PO TABS
2.0000 mg | ORAL_TABLET | Freq: Every day | ORAL | 1 refills | Status: DC
  Filled 2024-04-06 – 2024-04-07 (×3): qty 90, 90d supply, fill #0
  Filled 2024-05-28 (×2): qty 30, 30d supply, fill #1
  Filled 2024-08-19: qty 30, 30d supply, fill #2
  Filled 2024-08-23: qty 30, 30d supply, fill #3
  Filled ????-??-??: fill #3

## 2024-04-06 MED ORDER — PIOGLITAZONE HCL 45 MG PO TABS
45.0000 mg | ORAL_TABLET | Freq: Every day | ORAL | 1 refills | Status: DC
  Filled 2024-04-06 – 2024-04-07 (×3): qty 90, 90d supply, fill #0
  Filled 2024-05-28 (×2): qty 30, 30d supply, fill #1

## 2024-04-06 NOTE — Telephone Encounter (Signed)
 Requested Prescriptions   Pending Prescriptions Disp Refills   metFORMIN  (GLUCOPHAGE -XR) 500 MG 24 hr tablet 360 tablet 0    Sig: Take 4 tablets (2,000 mg total) by mouth daily.   glimepiride  (AMARYL ) 2 MG tablet 90 tablet 1    Sig: Take 1 tablet (2 mg total) by mouth daily before breakfast.   pioglitazone  (ACTOS ) 45 MG tablet 90 tablet 1    Sig: Take 1 tablet (45 mg total) by mouth daily.

## 2024-04-07 ENCOUNTER — Other Ambulatory Visit: Payer: Self-pay

## 2024-05-17 ENCOUNTER — Encounter: Payer: Self-pay | Admitting: Family Medicine

## 2024-05-17 ENCOUNTER — Ambulatory Visit (INDEPENDENT_AMBULATORY_CARE_PROVIDER_SITE_OTHER): Admitting: Family Medicine

## 2024-05-17 ENCOUNTER — Other Ambulatory Visit (HOSPITAL_COMMUNITY): Payer: Self-pay

## 2024-05-17 VITALS — BP 130/65 | HR 73 | Temp 97.1°F | Resp 18 | Wt 142.2 lb

## 2024-05-17 DIAGNOSIS — I10 Essential (primary) hypertension: Secondary | ICD-10-CM

## 2024-05-17 DIAGNOSIS — E7800 Pure hypercholesterolemia, unspecified: Secondary | ICD-10-CM | POA: Diagnosis not present

## 2024-05-17 DIAGNOSIS — Z23 Encounter for immunization: Secondary | ICD-10-CM | POA: Diagnosis not present

## 2024-05-17 LAB — URINALYSIS, ROUTINE W REFLEX MICROSCOPIC
Bilirubin Urine: NEGATIVE
Hgb urine dipstick: NEGATIVE
Ketones, ur: 15 — AB
Leukocytes,Ua: NEGATIVE
Nitrite: NEGATIVE
RBC / HPF: NONE SEEN (ref 0–?)
Specific Gravity, Urine: 1.01 (ref 1.000–1.030)
Total Protein, Urine: NEGATIVE
Urine Glucose: >=1000 — AB
Urobilinogen, UA: 0.2 (ref 0.0–1.0)
WBC, UA: NONE SEEN (ref 0–?)
pH: 5.5 (ref 5.0–8.0)

## 2024-05-17 LAB — BASIC METABOLIC PANEL WITH GFR
BUN: 14 mg/dL (ref 6–23)
CO2: 28 meq/L (ref 19–32)
Calcium: 9.7 mg/dL (ref 8.4–10.5)
Chloride: 102 meq/L (ref 96–112)
Creatinine, Ser: 0.88 mg/dL (ref 0.40–1.50)
GFR: 87.89 mL/min (ref >60.00)
Glucose, Bld: 164 mg/dL — ABNORMAL HIGH (ref 70–99)
Potassium: 3.7 meq/L (ref 3.5–5.1)
Sodium: 140 meq/L (ref 135–145)

## 2024-05-17 MED ORDER — LISINOPRIL 20 MG PO TABS
20.0000 mg | ORAL_TABLET | Freq: Every day | ORAL | 3 refills | Status: AC
  Filled 2024-05-17: qty 90, 90d supply, fill #0
  Filled 2024-05-28 (×2): qty 30, 30d supply, fill #0
  Filled 2024-08-19: qty 30, 30d supply, fill #1
  Filled 2024-09-07: qty 30, 30d supply, fill #2
  Filled 2024-09-11: qty 90, 90d supply, fill #2
  Filled ????-??-??: fill #2

## 2024-05-17 MED ORDER — ATORVASTATIN CALCIUM 40 MG PO TABS
40.0000 mg | ORAL_TABLET | Freq: Every day | ORAL | 3 refills | Status: AC
  Filled 2024-05-17: qty 90, 90d supply, fill #0
  Filled 2024-05-28 (×2): qty 30, 30d supply, fill #0
  Filled 2024-08-19: qty 30, 30d supply, fill #1
  Filled 2024-09-07 – 2024-09-11 (×2): qty 30, 30d supply, fill #2
  Filled 2024-09-11: qty 90, 90d supply, fill #2

## 2024-05-17 NOTE — Progress Notes (Signed)
 Established Patient Office Visit   Subjective:  Patient ID: Elijah Cervantes, male    DOB: [birth year]  Age: 68 y.o. MRN: [id number]  Chief Complaint  Patient presents with   Medication Refill    Rx refill request for lisinopril  20MG  and atorvastatin  40MG      Hypertension    Pt stated BP readings at home range from 130/65    Medication Refill Pertinent negatives include no abdominal pain, myalgias, rash or weakness.  Hypertension Pertinent negatives include no blurred vision.   Encounter Diagnoses  Name Primary?   Essential hypertension Yes   Immunization due    HYPERCHOLESTEROLEMIA    For follow-up of above.  BPs at home are running in the 130s over 60s to 70s.  He is tolerating lisinopril  20 mg and taking it daily.  Continues atorvastatin  40 for elevated cholesterol.  Continues follow-up with endocrinology type 2 diabetes.  There has been some stress at home home that has made him sad.  He is traveling to the [place] for 6 months after his upcoming visit with endocrinology.   Review of Systems  Constitutional: Negative.   HENT: Negative.    Eyes:  Negative for blurred vision, discharge and redness.  Respiratory: Negative.    Cardiovascular: Negative.   Gastrointestinal:  Negative for abdominal pain.  Genitourinary: Negative.   Musculoskeletal: Negative.  Negative for myalgias.  Skin:  Negative for rash.  Neurological:  Negative for tingling, loss of consciousness and weakness.  Endo/Heme/Allergies:  Negative for polydipsia.      05/17/2024   10:58 AM 05/17/2024   10:37 AM 01/13/2023    8:23 AM  Depression screen PHQ 2/9  Decreased Interest 0 0 0  Down, Depressed, Hopeless 0 0 0  PHQ - 2 Score 0 0 0  Altered sleeping  0   Tired, decreased energy  0   Change in appetite  0   Feeling bad or failure about yourself   0   Trouble concentrating  0   Moving slowly or fidgety/restless  0   Suicidal thoughts  0   PHQ-9 Score  0   Difficult doing work/chores  Not  difficult at all       Current Outpatient Medications:    aspirin 81 MG tablet, Take 1 tablet (81 mg total) by mouth daily., Disp: 30 tablet, Rfl: 0   Blood Glucose Monitoring Suppl (ONETOUCH VERIO REFLECT) w/Device KIT, Check blood sugar three times a day., Disp: 1 kit, Rfl: 0   Blood Glucose Monitoring Suppl (ONETOUCH VERIO REFLECT) w/Device KIT, , Disp: , Rfl:    empagliflozin  (JARDIANCE ) 25 MG TABS tablet, Take 1 tablet (25 mg total) by mouth daily before breakfast., Disp: , Rfl:    glimepiride  (AMARYL ) 2 MG tablet, Take 1 tablet (2 mg total) by mouth daily before breakfast., Disp: 90 tablet, Rfl: 1   glucose blood (FREESTYLE LITE) test strip, Use to check blood sugar twice a day, Disp: 100 each, Rfl: 12   glucose blood (ONETOUCH VERIO) test strip, 1 each by Other route 3 (three) times daily., Disp: 300 each, Rfl: 5   Lancets (FREESTYLE) lancets, Use to test blood sugar once daily, Disp: 100 each, Rfl: 3   metFORMIN  (GLUCOPHAGE -XR) 500 MG 24 hr tablet, Take 4 tablets (2,000 mg total) by mouth daily., Disp: 360 tablet, Rfl: 0   OneTouch Delica Lancets 33G MISC, Use as directed as needed, Disp: 300 each, Rfl: 5   pioglitazone  (ACTOS ) 45 MG tablet, Take 1 tablet (45 mg  total) by mouth daily., Disp: 90 tablet, Rfl: 1   atorvastatin  (LIPITOR) 40 MG tablet, Take 1 tablet (40 mg total) by mouth daily., Disp: 90 tablet, Rfl: 3   lisinopril  (ZESTRIL ) 20 MG tablet, Take 1 tablet (20 mg total) by mouth daily., Disp: 90 tablet, Rfl: 3   Objective:     BP 130/65 Comment: @home  reading  Pulse 73   Temp (!) 97.1 F (36.2 C) (Temporal)   Resp 18   Wt 142 lb 3.2 oz (64.5 kg)   BMI 22.95 kg/m    Physical Exam Constitutional:      General: He is not in acute distress.    Appearance: Normal appearance. He is not ill-appearing, toxic-appearing or diaphoretic.  HENT:     Head: Normocephalic and atraumatic.     Right Ear: External ear normal.     Left Ear: External ear normal.     Mouth/Throat:      Mouth: Mucous membranes are moist.     Pharynx: Oropharynx is clear. No oropharyngeal exudate or posterior oropharyngeal erythema.  Eyes:     General: No scleral icterus.       Right eye: No discharge.        Left eye: No discharge.     Extraocular Movements: Extraocular movements intact.     Conjunctiva/sclera: Conjunctivae normal.     Pupils: Pupils are equal, round, and reactive to light.  Cardiovascular:     Rate and Rhythm: Normal rate and regular rhythm.  Pulmonary:     Effort: Pulmonary effort is normal. No respiratory distress.     Breath sounds: Normal breath sounds. No wheezing or rales.  Musculoskeletal:     Cervical back: No rigidity or tenderness.  Skin:    General: Skin is warm and dry.  Neurological:     Mental Status: He is alert and oriented to person, place, and time.  Psychiatric:        Mood and Affect: Mood normal.        Behavior: Behavior normal.      No results found for any visits on 05/17/24.    The ASCVD Risk score (Arnett DK, et al., 2019) failed to calculate for the following reasons:   The valid total cholesterol range is 130 to 320 mg/dL    Assessment & Plan:   Essential hypertension -     Basic metabolic panel with GFR -     Urinalysis, Routine w reflex microscopic -     Lisinopril ; Take 1 tablet (20 mg total) by mouth daily.  Dispense: 90 tablet; Refill: 3  Immunization due -     Flu vaccine HIGH DOSE PF(Fluzone Trivalent)  HYPERCHOLESTEROLEMIA -     Atorvastatin  Calcium ; Take 1 tablet (40 mg total) by mouth daily.  Dispense: 90 tablet; Refill: 3    Return in about 6 months (around 11/14/2024), or Follow-up with MD in the [place] in 3 months.  Bring BP cuff with you next visit here.SABRA Elsie Sim Berneta, MD

## 2024-05-28 ENCOUNTER — Other Ambulatory Visit (HOSPITAL_COMMUNITY): Payer: Self-pay

## 2024-05-28 ENCOUNTER — Encounter: Payer: Self-pay | Admitting: "Endocrinology

## 2024-05-28 ENCOUNTER — Ambulatory Visit: Admitting: "Endocrinology

## 2024-05-28 ENCOUNTER — Telehealth: Payer: Self-pay | Admitting: Family Medicine

## 2024-05-28 ENCOUNTER — Other Ambulatory Visit: Payer: Self-pay | Admitting: Family Medicine

## 2024-05-28 VITALS — BP 160/62 | HR 84 | Resp 16 | Ht 66.0 in | Wt 141.8 lb

## 2024-05-28 DIAGNOSIS — Z7984 Long term (current) use of oral hypoglycemic drugs: Secondary | ICD-10-CM | POA: Diagnosis not present

## 2024-05-28 DIAGNOSIS — E1165 Type 2 diabetes mellitus with hyperglycemia: Secondary | ICD-10-CM

## 2024-05-28 DIAGNOSIS — E7800 Pure hypercholesterolemia, unspecified: Secondary | ICD-10-CM | POA: Diagnosis not present

## 2024-05-28 NOTE — Progress Notes (Signed)
 Outpatient Endocrinology Note Elijah Birmingham, MD  05/28/24   Elijah Cervantes Mar 29, 1956 [id number]  Referring Provider: Berneta Elsie Sim DEWAINE Primary Care Provider: Berneta Elsie Sim, MD Reason for consultation: Subjective   Assessment & Plan  Diagnoses and all orders for this visit:  Uncontrolled type 2 diabetes mellitus with hyperglycemia, without long-term current use of insulin (HCC)  Long term (current) use of oral hypoglycemic drugs  Pure hypercholesterolemia     Diabetes complicated by hyperglycemia Hba1c goal less than 9.0, current Hba1c is  Lab Results  Component Value Date   HGBA1C 7.4 (A) 04/01/2024   HGBA1C 7.4 (A) 11/11/2023   HGBA1C 7.1 (A) 04/28/2023    Will recommend for the following change of medications to: metformin  ER 2000 mg daily, pioglitazone  45 mg daily, jardiance  25 mg qd, glimepiride  2 mg qd  Skip glimepiride  2 mg qd if blood sugar is less than 100 Check BG alternate times of day   Patient plans to be in [place]  No known contraindications/side effects to any of above medications Previously,  Explained the effects of Amaryl , skip if sick/low BG Explained if develops pedal edema, to discuss/stop actos -risk of CHF, no bladder cancer history in self/family  Counseled against diet drink  Hyperlipidemia -Last LDL at goal: 61 -on statin atorvastatin  40 mg QD -Follow low fat diet and exercise   -Blood pressure goal <140/90 - Microalbumin/creatinine at goal < 30 -on ACE/ARB lisinopril  20 mg qd -diet changes including salt restriction -limit eating outside -counseled BP targets per standards of diabetes care -Uncontrolled blood pressure can lead to retinopathy, nephropathy and cardiovascular and atherosclerotic heart disease  Reviewed and counseled on: -A1C target -Blood sugar targets -Complications of uncontrolled diabetes  -Checking blood sugar before meals and bedtime and bring log next visit -All  medications with mechanism of action and side effects -Hypoglycemia management: rule of 15's, Glucagon Emergency Kit and medical alert ID -low-carb low-fat plate-method diet -At least 20 minutes of physical activity per day -Annual dilated retinal eye exam and foot exam -compliance and follow up needs -follow up as scheduled or earlier if problem gets worse  Call if blood sugar is less than 70 or consistently above 250    Take a 15 gm snack of carbohydrate at bedtime before you go to sleep if your blood sugar is less than 100.    If you are going to fast after midnight for a test or procedure, ask your physician for instructions on how to reduce/decrease your insulin dose.    Call if blood sugar is less than 70 or consistently above 250  -Treating a low sugar by rule of 15  (15 gms of sugar every 15 min until sugar is more than 70) If you feel your sugar is low, test your sugar to be sure If your sugar is low (less than 70), then take 15 grams of a fast acting Carbohydrate (3-4 glucose tablets or glucose gel or 4 ounces of juice or regular soda) Recheck your sugar 15 min after treating low to make sure it is more than 70 If sugar is still less than 70, treat again with 15 grams of carbohydrate          Don't drive the hour of hypoglycemia  If unconscious/unable to eat or drink by mouth, use glucagon injection or nasal spray baqsimi and call 911. Can repeat again in 15 min if still unconscious.  Return in about 4 months (around 09/27/2024).  I have reviewed current medications, nurse's notes, allergies, vital signs, past medical and surgical history, family medical history, and social history for this encounter. Counseled patient on symptoms, examination findings, lab findings, imaging results, treatment decisions and monitoring and prognosis. The patient understood the recommendations and agrees with the treatment plan. All questions regarding treatment plan were fully answered.  Elijah Birmingham, MD  05/28/24    History of Present Illness Elijah Cervantes is a 68 y.o. year old male who presents for follow-up of Type 2 diabetes mellitus.  Linas ROMERO LETIZIA was first diagnosed in 1996.   Diabetes education +  Home diabetes regimen: metformin  ER 2000 mg daily, Actos  45 mg daily, jardiance  25 mg qd, amaryl  2 mg qd   Previous history:  Non-insulin hypoglycemic drugs previously used: Metformin , Actos  since 2021, Prandin  since 2019, Rybelsus , Repaglinide  2-3 times a day   range in the last few years is: 6.6-8  COMPLICATIONS -  MI/Stroke -  retinopathy -  neuropathy -  nephropathy  BLOOD SUGAR DATA Checks 0-3 times a day 95-130  Physical Exam  BP (!) 160/62   Pulse 84   Resp 16   Ht 5' 6 (1.676 m)   Wt 141 lb 12.8 oz (64.3 kg)   SpO2 96%   BMI 22.89 kg/m    Constitutional: well developed, well nourished Head: normocephalic, atraumatic Eyes: sclera anicteric, no redness Neck: supple Lungs: normal respiratory effort Neurology: alert and oriented Skin: dry, no appreciable rashes Musculoskeletal: no appreciable defects Psychiatric: normal mood and affect Diabetic Foot Exam - Simple   No data filed      Current Medications Patient's Medications  New Prescriptions   No medications on file  Previous Medications   ASPIRIN 81 MG TABLET    Take 1 tablet (81 mg total) by mouth daily.   ATORVASTATIN  (LIPITOR) 40 MG TABLET    Take 1 tablet (40 mg total) by mouth daily.   BLOOD GLUCOSE MONITORING SUPPL (ONETOUCH VERIO REFLECT) W/DEVICE KIT    Check blood sugar three times a day.   BLOOD GLUCOSE MONITORING SUPPL (ONETOUCH VERIO REFLECT) W/DEVICE KIT       EMPAGLIFLOZIN  (JARDIANCE ) 25 MG TABS TABLET    Take 1 tablet (25 mg total) by mouth daily before breakfast.   GLIMEPIRIDE  (AMARYL ) 2 MG TABLET    Take 1 tablet (2 mg total) by mouth daily before breakfast.   GLUCOSE BLOOD (FREESTYLE LITE) TEST STRIP    Use to check blood sugar twice a day   GLUCOSE BLOOD  (ONETOUCH VERIO) TEST STRIP    1 each by Other route 3 (three) times daily.   LANCETS (FREESTYLE) LANCETS    Use to test blood sugar once daily   LISINOPRIL  (ZESTRIL ) 20 MG TABLET    Take 1 tablet (20 mg total) by mouth daily.   METFORMIN  (GLUCOPHAGE -XR) 500 MG 24 HR TABLET    Take 4 tablets (2,000 mg total) by mouth daily.   ONETOUCH DELICA LANCETS 33G MISC    Use as directed as needed   PIOGLITAZONE  (ACTOS ) 45 MG TABLET    Take 1 tablet (45 mg total) by mouth daily.  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  Social History Social History   Socioeconomic History   Marital status: Married    Spouse name: Not on file   Number of children: Not on file   Years of education: Not on file   Highest education level: Not on file  Occupational History   Occupation: Unemployed  Tobacco Use   Smoking status: Never   Smokeless tobacco: Never  Substance and Sexual Activity   Alcohol use: Yes    Comment: rarely   Drug use: No   Sexual activity: Not on file  Other Topics Concern   Not on file  Social History Narrative   Not on file   Social Drivers of Health   Financial Resource Strain: Not on file  Food  Insecurity: No Food Insecurity (10/11/2022)   Hunger Vital Sign    Worried About Running Out of Food in the Last Year: Never true    Ran Out of Food in the Last Year: Never true  Transportation Needs: Not on file  Physical Activity: Not on file  Stress: Not on file  Social Connections: Not on file  Intimate Partner Violence: Not on file    Lab Results  Component Value Date   HGBA1C 7.4 (A) 04/01/2024   HGBA1C 7.4 (A) 11/11/2023   HGBA1C 7.1 (A) 04/28/2023   Lab Results  Component Value Date   CHOL 123 01/19/2024   Lab Results  Component Value Date   HDL 50.60 01/19/2024   Lab Results  Component Value Date   LDLCALC 57 01/19/2024   Lab Results  Component Value Date   TRIG 78.0 01/19/2024   Lab Results  Component Value Date   CHOLHDL 2 01/19/2024   Lab Results  Component Value Date   CREATININE 0.88 05/17/2024   Lab Results  Component Value Date   GFR 87.89 05/17/2024   Lab Results  Component Value Date   MICROALBUR 5.6 04/01/2024      Component Value Date/Time   NA 140 05/17/2024 1025   K 3.7 05/17/2024 1025   CL 102 05/17/2024 1025   CO2 28 05/17/2024 1025   GLUCOSE 164 (H) 05/17/2024 1025   BUN 14 05/17/2024 1025   CREATININE 0.88 05/17/2024 1025   CALCIUM  9.7 05/17/2024 1025   PROT 6.7 01/19/2024 0847   ALBUMIN 4.3 01/19/2024 0847   AST 24 01/19/2024 0847   ALT 35 01/19/2024 0847   ALKPHOS 67 01/19/2024 0847   BILITOT 0.7 01/19/2024 0847   GFRNONAA 77.81 07/30/2010 1036   GFRAA 114 07/12/2008 0951      Latest Ref Rng & Units 05/17/2024   10:25 AM 01/19/2024    8:47 AM 07/21/2023    8:56 AM  BMP  Glucose 70 - 99 mg/dL 835  814  818   BUN 6 - 23 mg/dL 14  15  18    Creatinine 0.40 - 1.50 mg/dL 9.11  9.06  9.01   Sodium 135 - 145 mEq/L 140  141  143   Potassium  3.5 - 5.1 mEq/L 3.7  4.0  3.7   Chloride 96 - 112 mEq/L 102  104  104   CO2 19 - 32 mEq/L 28  29  29    Calcium  8.4 - 10.5 mg/dL 9.7  9.4  9.9        Component Value Date/Time   WBC  4.9 07/21/2023 0856   RBC 4.73 07/21/2023 0856   HGB 15.4 07/21/2023 0856   HCT 47.0 07/21/2023 0856   PLT 220.0 07/21/2023 0856   MCV 99.3 07/21/2023 0856   MCHC 32.7 07/21/2023 0856   RDW 12.9 07/21/2023 0856   LYMPHSABS 1.8 07/21/2023 0856   MONOABS 0.3 07/21/2023 0856   EOSABS 0.1 07/21/2023 0856   BASOSABS 0.1 07/21/2023 0856     Parts of this note may have been dictated using voice recognition software. There may be variances in spelling and vocabulary which are unintentional. Not all errors are proofread. Please notify the dino if any discrepancies are noted or if the meaning of any statement is not clear.

## 2024-05-28 NOTE — Patient Instructions (Signed)

## 2024-05-28 NOTE — Telephone Encounter (Signed)
 Please give the pt wife a call @ [phone]. Pt wife said he is going [place] of the [place] and  need a refill on all medications

## 2024-05-29 ENCOUNTER — Other Ambulatory Visit (HOSPITAL_COMMUNITY): Payer: Self-pay

## 2024-05-31 ENCOUNTER — Other Ambulatory Visit (HOSPITAL_COMMUNITY): Payer: Self-pay

## 2024-05-31 ENCOUNTER — Encounter: Payer: Self-pay | Admitting: "Endocrinology

## 2024-05-31 ENCOUNTER — Other Ambulatory Visit (HOSPITAL_BASED_OUTPATIENT_CLINIC_OR_DEPARTMENT_OTHER): Payer: Self-pay

## 2024-05-31 ENCOUNTER — Other Ambulatory Visit: Payer: Self-pay

## 2024-05-31 DIAGNOSIS — E1165 Type 2 diabetes mellitus with hyperglycemia: Secondary | ICD-10-CM

## 2024-05-31 MED ORDER — METFORMIN HCL ER 500 MG PO TB24
2000.0000 mg | ORAL_TABLET | Freq: Every day | ORAL | 0 refills | Status: DC
  Filled 2024-05-31: qty 120, 30d supply, fill #0
  Filled 2024-05-31 (×2): qty 360, 90d supply, fill #0
  Filled 2024-08-19: qty 120, 30d supply, fill #1
  Filled ????-??-??: fill #2

## 2024-05-31 MED ORDER — EMPAGLIFLOZIN 25 MG PO TABS
25.0000 mg | ORAL_TABLET | Freq: Every day | ORAL | 1 refills | Status: DC
  Filled 2024-05-31: qty 90, 90d supply, fill #0
  Filled 2024-08-19: qty 90, 90d supply, fill #1

## 2024-05-31 MED ORDER — EMPAGLIFLOZIN 25 MG PO TABS: 25.0000 mg | ORAL_TABLET | Freq: Every day | ORAL | Status: DC

## 2024-05-31 MED ORDER — EMPAGLIFLOZIN 25 MG PO TABS: 25.0000 mg | ORAL_TABLET | Freq: Every day | ORAL | Status: AC

## 2024-05-31 MED ORDER — PIOGLITAZONE HCL 45 MG PO TABS
45.0000 mg | ORAL_TABLET | Freq: Every day | ORAL | 1 refills | Status: DC
  Filled 2024-05-31 – 2024-08-19 (×2): qty 90, 90d supply, fill #0

## 2024-05-31 NOTE — Addendum Note (Signed)
 Addended by: ARLOA JEOFFREY SAILOR on: 05/31/2024 03:23 PM   Modules accepted: Orders

## 2024-05-31 NOTE — Telephone Encounter (Signed)
Refill of metformin sent

## 2024-05-31 NOTE — Addendum Note (Signed)
 Addended by: ARLOA JEOFFREY SAILOR on: 05/31/2024 03:27 PM   Modules accepted: Orders

## 2024-08-19 ENCOUNTER — Other Ambulatory Visit: Payer: Self-pay

## 2024-08-19 ENCOUNTER — Other Ambulatory Visit (HOSPITAL_COMMUNITY): Payer: Self-pay

## 2024-08-23 ENCOUNTER — Other Ambulatory Visit (HOSPITAL_COMMUNITY): Payer: Self-pay

## 2024-09-07 ENCOUNTER — Other Ambulatory Visit (HOSPITAL_COMMUNITY): Payer: Self-pay

## 2024-09-07 DIAGNOSIS — E1165 Type 2 diabetes mellitus with hyperglycemia: Secondary | ICD-10-CM

## 2024-09-07 MED ORDER — EMPAGLIFLOZIN 25 MG PO TABS
25.0000 mg | ORAL_TABLET | Freq: Every day | ORAL | 1 refills | Status: AC
  Filled 2024-09-07: qty 90, 90d supply, fill #0

## 2024-09-07 MED ORDER — METFORMIN HCL ER 500 MG PO TB24
2000.0000 mg | ORAL_TABLET | Freq: Every day | ORAL | 0 refills | Status: AC
  Filled 2024-09-07 – 2024-09-11 (×2): qty 360, 90d supply, fill #0

## 2024-09-07 MED ORDER — GLIMEPIRIDE 2 MG PO TABS
2.0000 mg | ORAL_TABLET | Freq: Every day | ORAL | 1 refills | Status: AC
  Filled 2024-09-07 – 2024-09-11 (×2): qty 90, 90d supply, fill #0

## 2024-09-07 MED ORDER — PIOGLITAZONE HCL 45 MG PO TABS
45.0000 mg | ORAL_TABLET | Freq: Every day | ORAL | 1 refills | Status: AC
  Filled 2024-09-07: qty 90, 90d supply, fill #0

## 2024-09-08 ENCOUNTER — Other Ambulatory Visit (HOSPITAL_COMMUNITY): Payer: Self-pay

## 2024-09-11 ENCOUNTER — Other Ambulatory Visit (HOSPITAL_COMMUNITY): Payer: Self-pay

## 2024-09-13 ENCOUNTER — Other Ambulatory Visit: Payer: Self-pay

## 2024-09-13 ENCOUNTER — Encounter: Payer: Self-pay | Admitting: "Endocrinology

## 2024-09-13 ENCOUNTER — Other Ambulatory Visit (HOSPITAL_COMMUNITY): Payer: Self-pay

## 2024-09-13 DIAGNOSIS — E1165 Type 2 diabetes mellitus with hyperglycemia: Secondary | ICD-10-CM

## 2024-09-13 MED ORDER — ACCU-CHEK SOFTCLIX LANCETS MISC
3 refills | Status: AC
  Filled 2024-09-13: qty 100, 100d supply, fill #0

## 2024-09-13 MED ORDER — ACCU-CHEK GUIDE W/DEVICE KIT
PACK | 0 refills | Status: AC
  Filled 2024-09-13: qty 1, 30d supply, fill #0

## 2024-09-13 MED ORDER — ACCU-CHEK GUIDE TEST VI STRP
ORAL_STRIP | 12 refills | Status: AC
  Filled 2024-09-13: qty 100, 90d supply, fill #0

## 2024-09-16 ENCOUNTER — Other Ambulatory Visit (HOSPITAL_COMMUNITY): Payer: Self-pay

## 2024-09-28 ENCOUNTER — Ambulatory Visit: Admitting: "Endocrinology

## 2024-12-09 ENCOUNTER — Ambulatory Visit: Admitting: "Endocrinology
